# Patient Record
Sex: Male | Born: 1950 | Race: White | Hispanic: No | Marital: Single | Smoking: Never smoker
Health system: Southern US, Community
[De-identification: ages and names within clinical notes are randomized; demographics above are authoritative.]

## PROBLEM LIST (undated history)

## (undated) DIAGNOSIS — C801 Malignant (primary) neoplasm, unspecified: Secondary | ICD-10-CM

## (undated) DIAGNOSIS — G35 Multiple sclerosis: Secondary | ICD-10-CM

## (undated) HISTORY — PX: TONSILLECTOMY: SUR1361

## (undated) HISTORY — PX: FRACTURE SURGERY: SHX138

---

## 2002-03-05 ENCOUNTER — Encounter: Payer: Self-pay | Admitting: *Deleted

## 2002-03-05 ENCOUNTER — Emergency Department (HOSPITAL_COMMUNITY): Admission: EM | Admit: 2002-03-05 | Discharge: 2002-03-05 | Payer: Self-pay | Admitting: *Deleted

## 2020-05-11 ENCOUNTER — Emergency Department (HOSPITAL_COMMUNITY): Payer: Medicare HMO

## 2020-05-11 ENCOUNTER — Inpatient Hospital Stay (HOSPITAL_COMMUNITY)
Admission: EM | Admit: 2020-05-11 | Discharge: 2020-05-16 | DRG: 181 | Disposition: A | Discharge status: Home Health | Payer: Medicare HMO | Attending: Family Medicine | Admitting: Family Medicine

## 2020-05-11 ENCOUNTER — Encounter (HOSPITAL_COMMUNITY): Payer: Self-pay | Admitting: Emergency Medicine

## 2020-05-11 ENCOUNTER — Other Ambulatory Visit: Payer: Self-pay

## 2020-05-11 DIAGNOSIS — G35D Multiple sclerosis, unspecified: Secondary | ICD-10-CM | POA: Diagnosis present

## 2020-05-11 DIAGNOSIS — Z888 Allergy status to other drugs, medicaments and biological substances status: Secondary | ICD-10-CM | POA: Diagnosis not present

## 2020-05-11 DIAGNOSIS — Z6822 Body mass index (BMI) 22.0-22.9, adult: Secondary | ICD-10-CM | POA: Diagnosis not present

## 2020-05-11 DIAGNOSIS — L723 Sebaceous cyst: Secondary | ICD-10-CM | POA: Diagnosis present

## 2020-05-11 DIAGNOSIS — G35 Multiple sclerosis: Secondary | ICD-10-CM | POA: Diagnosis present

## 2020-05-11 DIAGNOSIS — E278 Other specified disorders of adrenal gland: Secondary | ICD-10-CM | POA: Diagnosis present

## 2020-05-11 DIAGNOSIS — R1084 Generalized abdominal pain: Secondary | ICD-10-CM | POA: Diagnosis not present

## 2020-05-11 DIAGNOSIS — H548 Legal blindness, as defined in USA: Secondary | ICD-10-CM | POA: Diagnosis present

## 2020-05-11 DIAGNOSIS — R918 Other nonspecific abnormal finding of lung field: Secondary | ICD-10-CM | POA: Diagnosis present

## 2020-05-11 DIAGNOSIS — E440 Moderate protein-calorie malnutrition: Secondary | ICD-10-CM | POA: Diagnosis present

## 2020-05-11 DIAGNOSIS — R630 Anorexia: Secondary | ICD-10-CM | POA: Diagnosis not present

## 2020-05-11 DIAGNOSIS — E279 Disorder of adrenal gland, unspecified: Secondary | ICD-10-CM | POA: Diagnosis present

## 2020-05-11 DIAGNOSIS — R634 Abnormal weight loss: Secondary | ICD-10-CM | POA: Diagnosis not present

## 2020-05-11 DIAGNOSIS — C7970 Secondary malignant neoplasm of unspecified adrenal gland: Secondary | ICD-10-CM | POA: Diagnosis present

## 2020-05-11 DIAGNOSIS — J439 Emphysema, unspecified: Secondary | ICD-10-CM | POA: Diagnosis present

## 2020-05-11 DIAGNOSIS — C3411 Malignant neoplasm of upper lobe, right bronchus or lung: Principal | ICD-10-CM | POA: Diagnosis present

## 2020-05-11 DIAGNOSIS — Z87891 Personal history of nicotine dependence: Secondary | ICD-10-CM | POA: Diagnosis not present

## 2020-05-11 DIAGNOSIS — R7989 Other specified abnormal findings of blood chemistry: Secondary | ICD-10-CM | POA: Diagnosis not present

## 2020-05-11 DIAGNOSIS — R739 Hyperglycemia, unspecified: Secondary | ICD-10-CM | POA: Diagnosis present

## 2020-05-11 DIAGNOSIS — E860 Dehydration: Secondary | ICD-10-CM | POA: Diagnosis present

## 2020-05-11 DIAGNOSIS — Z515 Encounter for palliative care: Secondary | ICD-10-CM

## 2020-05-11 DIAGNOSIS — G4700 Insomnia, unspecified: Secondary | ICD-10-CM | POA: Diagnosis present

## 2020-05-11 DIAGNOSIS — D630 Anemia in neoplastic disease: Secondary | ICD-10-CM | POA: Diagnosis present

## 2020-05-11 DIAGNOSIS — R110 Nausea: Secondary | ICD-10-CM | POA: Diagnosis present

## 2020-05-11 DIAGNOSIS — N2889 Other specified disorders of kidney and ureter: Secondary | ICD-10-CM | POA: Diagnosis not present

## 2020-05-11 DIAGNOSIS — C787 Secondary malignant neoplasm of liver and intrahepatic bile duct: Secondary | ICD-10-CM | POA: Diagnosis present

## 2020-05-11 DIAGNOSIS — R627 Adult failure to thrive: Principal | ICD-10-CM

## 2020-05-11 DIAGNOSIS — K5730 Diverticulosis of large intestine without perforation or abscess without bleeding: Secondary | ICD-10-CM | POA: Diagnosis present

## 2020-05-11 DIAGNOSIS — R112 Nausea with vomiting, unspecified: Secondary | ICD-10-CM | POA: Diagnosis present

## 2020-05-11 DIAGNOSIS — D72829 Elevated white blood cell count, unspecified: Secondary | ICD-10-CM | POA: Diagnosis present

## 2020-05-11 DIAGNOSIS — Z7189 Other specified counseling: Secondary | ICD-10-CM

## 2020-05-11 DIAGNOSIS — F411 Generalized anxiety disorder: Secondary | ICD-10-CM | POA: Diagnosis present

## 2020-05-11 DIAGNOSIS — Z800 Family history of malignant neoplasm of digestive organs: Secondary | ICD-10-CM | POA: Diagnosis not present

## 2020-05-11 DIAGNOSIS — R000 Tachycardia, unspecified: Secondary | ICD-10-CM | POA: Diagnosis present

## 2020-05-11 DIAGNOSIS — Z20822 Contact with and (suspected) exposure to covid-19: Secondary | ICD-10-CM | POA: Diagnosis present

## 2020-05-11 DIAGNOSIS — C3491 Malignant neoplasm of unspecified part of right bronchus or lung: Secondary | ICD-10-CM | POA: Diagnosis present

## 2020-05-11 DIAGNOSIS — M79604 Pain in right leg: Secondary | ICD-10-CM

## 2020-05-11 DIAGNOSIS — R109 Unspecified abdominal pain: Secondary | ICD-10-CM | POA: Diagnosis present

## 2020-05-11 DIAGNOSIS — R39198 Other difficulties with micturition: Secondary | ICD-10-CM | POA: Diagnosis present

## 2020-05-11 DIAGNOSIS — H543 Unqualified visual loss, both eyes: Secondary | ICD-10-CM | POA: Diagnosis not present

## 2020-05-11 DIAGNOSIS — R1900 Intra-abdominal and pelvic swelling, mass and lump, unspecified site: Secondary | ICD-10-CM

## 2020-05-11 DIAGNOSIS — J449 Chronic obstructive pulmonary disease, unspecified: Secondary | ICD-10-CM | POA: Diagnosis present

## 2020-05-11 DIAGNOSIS — E43 Unspecified severe protein-calorie malnutrition: Secondary | ICD-10-CM | POA: Diagnosis present

## 2020-05-11 HISTORY — DX: Multiple sclerosis: G35

## 2020-05-11 LAB — CBC WITH DIFFERENTIAL/PLATELET
Abs Immature Granulocytes: 0.03 10*3/uL (ref 0.00–0.07)
Basophils Absolute: 0.1 10*3/uL (ref 0.0–0.1)
Basophils Relative: 0 %
Eosinophils Absolute: 0 10*3/uL (ref 0.0–0.5)
Eosinophils Relative: 0 %
HCT: 40.8 % (ref 39.0–52.0)
Hemoglobin: 13 g/dL (ref 13.0–17.0)
Immature Granulocytes: 0 %
Lymphocytes Relative: 10 %
Lymphs Abs: 1.4 10*3/uL (ref 0.7–4.0)
MCH: 32.7 pg (ref 26.0–34.0)
MCHC: 31.9 g/dL (ref 30.0–36.0)
MCV: 102.8 fL — ABNORMAL HIGH (ref 80.0–100.0)
Monocytes Absolute: 1.1 10*3/uL — ABNORMAL HIGH (ref 0.1–1.0)
Monocytes Relative: 8 %
Neutro Abs: 10.8 10*3/uL — ABNORMAL HIGH (ref 1.7–7.7)
Neutrophils Relative %: 82 %
Platelets: 349 10*3/uL (ref 150–400)
RBC: 3.97 MIL/uL — ABNORMAL LOW (ref 4.22–5.81)
RDW: 12.4 % (ref 11.5–15.5)
WBC: 13.4 10*3/uL — ABNORMAL HIGH (ref 4.0–10.5)
nRBC: 0 % (ref 0.0–0.2)

## 2020-05-11 LAB — URINALYSIS, ROUTINE W REFLEX MICROSCOPIC
Bilirubin Urine: NEGATIVE
Glucose, UA: NEGATIVE mg/dL
Ketones, ur: 5 mg/dL — AB
Nitrite: NEGATIVE
Protein, ur: 30 mg/dL — AB
Specific Gravity, Urine: 1.035 — ABNORMAL HIGH (ref 1.005–1.030)
pH: 6 (ref 5.0–8.0)

## 2020-05-11 LAB — RESPIRATORY PANEL BY RT PCR (FLU A&B, COVID)
Influenza A by PCR: NEGATIVE
Influenza B by PCR: NEGATIVE
SARS Coronavirus 2 by RT PCR: NEGATIVE

## 2020-05-11 LAB — COMPREHENSIVE METABOLIC PANEL
ALT: 51 U/L — ABNORMAL HIGH (ref 0–44)
AST: 59 U/L — ABNORMAL HIGH (ref 15–41)
Albumin: 3.6 g/dL (ref 3.5–5.0)
Alkaline Phosphatase: 179 U/L — ABNORMAL HIGH (ref 38–126)
Anion gap: 12 (ref 5–15)
BUN: 18 mg/dL (ref 8–23)
CO2: 26 mmol/L (ref 22–32)
Calcium: 9.6 mg/dL (ref 8.9–10.3)
Chloride: 99 mmol/L (ref 98–111)
Creatinine, Ser: 0.66 mg/dL (ref 0.61–1.24)
GFR, Estimated: >60 mL/min (ref >60)
Glucose, Bld: 109 mg/dL — ABNORMAL HIGH (ref 70–99)
Potassium: 4.3 mmol/L (ref 3.5–5.1)
Sodium: 137 mmol/L (ref 135–145)
Total Bilirubin: 0.5 mg/dL (ref 0.3–1.2)
Total Protein: 7.3 g/dL (ref 6.5–8.1)

## 2020-05-11 LAB — LIPASE, BLOOD: Lipase: 21 U/L (ref 11–51)

## 2020-05-11 MED ORDER — ONDANSETRON HCL 4 MG/2ML IJ SOLN
4.0000 mg | Freq: Four times a day (QID) | INTRAMUSCULAR | Status: DC | PRN
  Administered 2020-05-13: 4 mg via INTRAVENOUS
  Filled 2020-05-11: qty 2

## 2020-05-11 MED ORDER — ONDANSETRON HCL 4 MG/2ML IJ SOLN
4.0000 mg | Freq: Once | INTRAMUSCULAR | Status: AC
  Administered 2020-05-11: 4 mg via INTRAVENOUS
  Filled 2020-05-11: qty 2

## 2020-05-11 MED ORDER — TRAZODONE HCL 50 MG PO TABS: 25.0000 mg | ORAL_TABLET | Freq: Every evening | ORAL | Status: DC | PRN

## 2020-05-11 MED ORDER — BISACODYL 5 MG PO TBEC: 5.0000 mg | DELAYED_RELEASE_TABLET | Freq: Every day | ORAL | Status: DC | PRN

## 2020-05-11 MED ORDER — MORPHINE SULFATE (PF) 4 MG/ML IV SOLN
4.0000 mg | Freq: Once | INTRAVENOUS | Status: AC
  Administered 2020-05-11: 4 mg via INTRAVENOUS
  Filled 2020-05-11: qty 1

## 2020-05-11 MED ORDER — ACETAMINOPHEN 325 MG PO TABS
650.0000 mg | ORAL_TABLET | Freq: Four times a day (QID) | ORAL | Status: DC | PRN
  Administered 2020-05-13: 650 mg via ORAL
  Filled 2020-05-11: qty 2

## 2020-05-11 MED ORDER — PANTOPRAZOLE SODIUM 40 MG PO TBEC
40.0000 mg | DELAYED_RELEASE_TABLET | Freq: Every day | ORAL | Status: DC
  Administered 2020-05-12 – 2020-05-16 (×4): 40 mg via ORAL
  Filled 2020-05-11 (×5): qty 1

## 2020-05-11 MED ORDER — ACETAMINOPHEN 650 MG RE SUPP: 650.0000 mg | Freq: Four times a day (QID) | RECTAL | Status: DC | PRN

## 2020-05-11 MED ORDER — IPRATROPIUM-ALBUTEROL 0.5-2.5 (3) MG/3ML IN SOLN: 3.0000 mL | RESPIRATORY_TRACT | Status: DC | PRN

## 2020-05-11 MED ORDER — SODIUM CHLORIDE 0.9 % IV BOLUS
1000.0000 mL | Freq: Once | INTRAVENOUS | Status: AC
  Administered 2020-05-11: 1000 mL via INTRAVENOUS

## 2020-05-11 MED ORDER — SODIUM CHLORIDE 0.9 % IV SOLN: INTRAVENOUS | Status: DC

## 2020-05-11 MED ORDER — ONDANSETRON HCL 4 MG PO TABS: 4.0000 mg | ORAL_TABLET | Freq: Four times a day (QID) | ORAL | Status: DC | PRN

## 2020-05-11 MED ORDER — IOHEXOL 300 MG/ML  SOLN
80.0000 mL | Freq: Once | INTRAMUSCULAR | Status: AC | PRN
  Administered 2020-05-11: 80 mL via INTRAVENOUS

## 2020-05-11 MED ORDER — OXYCODONE HCL 5 MG PO TABS
5.0000 mg | ORAL_TABLET | Freq: Four times a day (QID) | ORAL | Status: DC | PRN
  Administered 2020-05-12 – 2020-05-14 (×4): 5 mg via ORAL
  Filled 2020-05-11 (×4): qty 1

## 2020-05-11 MED ORDER — HEPARIN SODIUM (PORCINE) 5000 UNIT/ML IJ SOLN
5000.0000 [IU] | Freq: Three times a day (TID) | INTRAMUSCULAR | Status: DC
  Administered 2020-05-11 – 2020-05-14 (×8): 5000 [IU] via SUBCUTANEOUS
  Filled 2020-05-11 (×8): qty 1

## 2020-05-11 MED ORDER — HYDROMORPHONE HCL 1 MG/ML IJ SOLN
0.2500 mg | INTRAMUSCULAR | Status: DC | PRN
  Administered 2020-05-11 – 2020-05-13 (×9): 0.25 mg via INTRAVENOUS
  Filled 2020-05-11 (×9): qty 0.5

## 2020-05-11 MED ORDER — LORAZEPAM 0.5 MG PO TABS: 0.5000 mg | ORAL_TABLET | ORAL | Status: DC | PRN

## 2020-05-11 NOTE — H&P (Signed)
History and Physical  [facility]  Brandon Castillo OTL:[id number] DOB: December 22, 1950 DOA: 05/11/2020  PCP: Patient, No Pcp Per  Patient coming from: Home   I have personally briefly reviewed patient's old medical records in [place]  Chief Complaint: weakness   HPI: Brandon Castillo is a 69 y.o. 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  He has not seen a regular physician since he was a child.  He says that he has been hospitalized on rare occasions many years ago but not seeing any regular providers.  He reports that he is a former heavy smoker.  He smoked 1 pack/day for about 30 years.  He says that he quit smoking several years ago.  Patient says that he has never had a colon cancer screening and no colonoscopy or EGD.  He says that he lives with his brother who serves as his primary caretaker.  He says that for the past month he has been experiencing decreasing appetite, weight loss, generalized malaise, generalized abdominal pain, fatigue and pain in the left groin and lower abdomen and difficulty urinating.  He also reports that he has been dealing with a growth on his scrotum that affects him when he tries to sit down.  He has been having occasional fever and chills.  He is not sure if he has had blood in the urine because he is blind.  He cannot tell me the color of his stool.  He presented to the emergency department with his brother today because they have been concerned about his abnormal weight loss, abdominal pain generalized weakness and frailty.  ED Course: The patient was afebrile on arrival with a temperature of 98.5, pulse 112, blood pressure 141/75, pulse ox 99% on room air.  Weight 68 kg.  BMI 22.14.  Glucose 109.  The sodium was 137, potassium 4.3, BUN 18, creatinine 0.66, alk phos 179, lipase 21, AST 59, ALT 51, WBC 13.4, hemoglobin 13.0, hematocrit 40.8, MCV 102, platelet count 349.  Influenza a and B and SARS 2  coronavirus test were all negative.  Urinalysis with findings of small amount of hemoglobin, ketones, trace leukocytes.  CT abdomen was done with findings of bilateral renal masses, liver nodule, bilateral adrenal nodules and a right upper lobe density.  CT chest without contrast with findings of a right upper lobe mass which is suspicious for primary lung neoplasm with metastases seen and noted.  Spoke with oncologist Dr. Delton Coombes who will consult on patient.  He says patient [facility].  He recommended IR evaluation for possible liver biopsy for tissue diagnosis.  Review of Systems: As per HPI otherwise 10 point review of systems negative.   Past Medical History:  Diagnosis Date  . Multiple sclerosis ([place])     History reviewed. No pertinent surgical history.   reports that he has never smoked. He has never used smokeless tobacco. He reports previous alcohol use. He reports previous drug use.  Allergies  Allergen Reactions  . Demerol [Meperidine Hcl]     History reviewed. No pertinent family history.   Prior to Admission medications   Medication Sig Start Date End Date Taking? Authorizing Provider  HYDROcodone-acetaminophen (NORCO) 10-325 MG tablet Take 2 tablets by mouth every 6 (six) hours as needed for moderate pain.   Yes [provider]  LORazepam (ATIVAN) 1 MG tablet Take 1 mg by mouth every 8 (eight) hours as needed for anxiety.  Yes [provider]    Physical Exam: Vitals:   05/11/20 1030 05/11/20 1034 05/11/20 1100  BP:  (!) 161/80 (!) 141/75  Pulse:  (!) 112   Resp:  17   Temp:  98.5 F (36.9 C)   TempSrc:  Oral   SpO2:  99%   Weight: 68 kg    Height: _0  (1.753 m)     Constitutional: Thin emaciated cachectic appearing male, blind with dark glasses, NAD, calm, comfortable Eyes: PERRL, lids and conjunctivae normal ENMT: Mucous membranes are dry and pale. Posterior pharynx clear of any exudate or lesions.  Neck: normal,  supple, no masses, no thyromegaly Respiratory: clear to auscultation bilaterally, no wheezing, no crackles. Normal respiratory effort. No accessory muscle use.  Cardiovascular: Normal S1-S2 sounds, tachycardic rate, no murmurs / rubs / gallops. No extremity edema. 2+ pedal pulses. No carotid bruits.  Abdomen: Suprapubic tenderness and left lower quadrant tenderness with deep palpation no masses palpated. No hepatosplenomegaly. Bowel sounds positive.  GU: Normal circumcised penis, sebaceous cyst approximately 2 cm in diameter noted on the scrotum does not appear to be infected at this time. Musculoskeletal: no clubbing / cyanosis. No joint deformity upper and lower extremities. Good ROM, no contractures. Normal muscle tone.  Skin: no rashes, lesions, ulcers. No induration Neurologic: Blind in both eyes.  No focal findings. Sensation intact, DTR normal. Strength 5/5 in all 4.  Psychiatric: Normal judgment and insight. Alert and oriented x 3. Normal mood.    Labs on Admission: I have personally reviewed following labs and imaging studies  CBC: Recent Labs  Lab 05/11/20 1047  WBC 13.4*  NEUTROABS 10.8*  HGB 13.0  HCT 40.8  MCV 102.8*  PLT 161   Basic Metabolic Panel: Recent Labs  Lab 05/11/20 1047  NA 137  K 4.3  CL 99  CO2 26  GLUCOSE 109*  BUN 18  CREATININE 0.66  CALCIUM 9.6   GFR: Estimated Creatinine Clearance: 85 mL/min (by C-G formula based on SCr of 0.66 mg/dL). Liver Function Tests: Recent Labs  Lab 05/11/20 1047  AST 59*  ALT 51*  ALKPHOS 179*  BILITOT 0.5  PROT 7.3  ALBUMIN 3.6   Recent Labs  Lab 05/11/20 1047  LIPASE 21   No results for input(s): AMMONIA in the last 168 hours. Coagulation Profile: No results for input(s): INR, PROTIME in the last 168 hours. Cardiac Enzymes: No results for input(s): CKTOTAL, CKMB, CKMBINDEX, TROPONINI in the last 168 hours. BNP (last 3 results) No results for input(s): PROBNP in the last 8760 hours. HbA1C: No  results for input(s): HGBA1C in the last 72 hours. CBG: No results for input(s): GLUCAP in the last 168 hours. Lipid Profile: No results for input(s): CHOL, HDL, LDLCALC, TRIG, CHOLHDL, LDLDIRECT in the last 72 hours. Thyroid Function Tests: No results for input(s): TSH, T4TOTAL, FREET4, T3FREE, THYROIDAB in the last 72 hours. Anemia Panel: No results for input(s): VITAMINB12, FOLATE, FERRITIN, TIBC, IRON, RETICCTPCT in the last 72 hours. Urine analysis:    Component Value Date/Time   COLORURINE YELLOW 05/11/2020 [place] 05/11/2020 1041   LABSPEC 1.035 (H) 05/11/2020 1041   PHURINE 6.0 05/11/2020 [place] 05/11/2020 1041   HGBUR SMALL (A) 05/11/2020 1041   BILIRUBINUR NEGATIVE 05/11/2020 1041   KETONESUR 5 (A) 05/11/2020 1041   PROTEINUR 30 (A) 05/11/2020 1041   NITRITE NEGATIVE 05/11/2020 1041   LEUKOCYTESUR TRACE (A) 05/11/2020 1041    Radiological Exams on Admission: DG  Chest 2 View  Result Date: 05/11/2020 CLINICAL DATA:  Chest pain. EXAM: CHEST - 2 VIEW COMPARISON:  None. FINDINGS: The heart is normal in size. Mild tortuosity and calcification of the thoracic aorta. Slight ill-defined density in the right suprahilar/right upper lobe region. This is indeterminate finding but chest CT suggested to further evaluate. Lungs demonstrate hyperinflation and emphysematous changes. No pleural effusions. IMPRESSION: 1. Ill-defined right suprahilar/right upper lobe density. Chest CT suggested for further evaluate. 2. Underlying emphysematous changes. Electronically Signed   By: Marijo Sanes M.D.   On: 05/11/2020 13:26   CT Chest Wo Contrast  Result Date: 05/11/2020 CLINICAL DATA:  Abnormal chest x-ray. EXAM: CT CHEST WITHOUT CONTRAST TECHNIQUE: Multidetector CT imaging of the chest was performed following the standard protocol without IV contrast. COMPARISON:  Chest x-ray, same date. FINDINGS: Cardiovascular: The heart is normal in size. No pericardial  effusion. Mild tortuosity and ectasia of the thoracic aorta with scattered atherosclerotic calcifications. Scattered coronary artery calcifications. Mediastinum/Nodes: Right upper lobe lung mass is invading the right hilum and right mediastinum. I do not see any discrete measurable mediastinal lymph nodes. The esophagus is grossly normal. Lungs/Pleura: 3.2 cm spiculated right upper lobe lung mass medially invading the right hilum and the right mediastinum. Findings consistent with primary lung neoplasm. Advanced emphysematous changes and areas of pulmonary scarring. There are several scattered subpleural pulmonary nodules noted which are indeterminate. None of these measures more than 5 mm. Metastatic disease is certainly a consideration. No acute pulmonary findings.  No pleural effusions. Upper Abdomen: Bilateral adrenal gland nodules and upper pole right renal lesion as seen on today's abdominal/pelvic CT scan. Musculoskeletal: No worrisome bone lesions to suggest metastatic bone disease. IMPRESSION: 1. 3.2 cm spiculated right upper lobe lung mass invading the right hilum and right mediastinum consistent with primary lung neoplasm. 2. Several scattered subpleural pulmonary nodules are indeterminate. Metastatic disease is certainly a consideration. 3. Advanced emphysematous changes and pulmonary scarring. 4. Bilateral adrenal gland nodules and upper pole right renal lesion as seen on today's abdominal/pelvic CT scan. 5. No findings for osseous metastatic disease. 6. Emphysema and aortic atherosclerosis. Aortic Atherosclerosis (ICD10-I70.0) and Emphysema (ICD10-J43.9). Electronically Signed   By: Marijo Sanes M.D.   On: 05/11/2020 14:26   CT Abdomen Pelvis W Contrast  Addendum Date: 05/11/2020   ADDENDUM REPORT: 05/11/2020 13:43 ADDENDUM: Of note, an alternative differential consideration for the bilateral renal masses is renal abscesses. The hypoechoic mass in the liver could represent a benign hemangioma,  and MR abdomen according could be considered for further evaluation as part of the patient's workup. These results were called by telephone at the time of interpretation on 05/11/2020 at 1:42 pm to provider [facility] , who verbally acknowledged these results. Electronically Signed   By: Zerita Boers M.D.   On: 05/11/2020 13:43   Result Date: 05/11/2020 CLINICAL DATA:  Lower abdominal pain and generalized weakness for 2 weeks. EXAM: CT ABDOMEN AND PELVIS WITH CONTRAST TECHNIQUE: Multidetector CT imaging of the abdomen and pelvis was performed using the standard protocol following bolus administration of intravenous contrast. CONTRAST:  84m OMNIPAQUE IOHEXOL 300 MG/ML  SOLN COMPARISON:  Report from abdominal radiograph dated 03/05/2002. FINDINGS: Lower chest: No acute abnormality. Hepatobiliary: There is a 1.6 cm hypoattenuating mass in hepatic segment 5/4B near the gallbladder fossa. No gallstones, gallbladder wall thickening, or biliary dilatation. Pancreas: Unremarkable. No pancreatic ductal dilatation or surrounding inflammatory changes. Spleen: Normal in size without focal abnormality. Adrenals/Urinary Tract: Two left adrenal nodules measure  approximately 0.8 and 0.9 cm (series 5, image 49 and image 46). The right adrenal gland appears normal. Multiple indistinct hypoechoic masses enlarged the right kidney with the largest measuring 6.2 cm in diameter. A mass is seen in the right renal pelvis, measuring 2.9 cm (series 7, image 16). At least two hypoechoic masses are seen in the left kidney, measuring up to 1.7 cm. A 7 mm mass in the lateral right kidney likely represents a benign cyst (series 2, image 33). There is no hydronephrosis. Bladder is unremarkable. Stomach/Bowel: Stomach is within normal limits. Appendix appears normal. There is colonic diverticulosis without evidence of diverticulitis. No evidence of bowel wall thickening, distention, or inflammatory changes. Vascular/Lymphatic: Aortic  atherosclerosis. The inferior vena cava and both renal veins appear normal. No enlarged abdominal or pelvic lymph nodes. Reproductive: The prostate is mildly enlarged, measuring 5.1 cm in transverse dimension Other: No abdominal wall hernia or abnormality. No abdominopelvic ascites. Musculoskeletal: Degenerative changes are seen in the spine. IMPRESSION: 1. Multiple hypoechoic masses in both kidneys as well as a mass in the right renal pelvis. Differential considerations include renal cell carcinoma, transitional cell carcinoma, renal lymphoma, and metastatic disease. Indeterminate hypoattenuating mass in the liver and two left adrenal nodules may represent metastatic disease. CT abdomen pelvis according to a renal protocol could be considered for further evaluation. Aortic Atherosclerosis (ICD10-I70.0). Electronically Signed: By: Zerita Boers M.D. On: 05/11/2020 12:28   Assessment/Plan Principal Problem:   Mass of upper lobe of right lung Active Problems:   Generalized abdominal pain   Abnormal weight loss   Multiple sclerosis (HCC)   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   Nausea and vomiting   Anorexia   Difficulty urinating   Leukocytosis   Sinus tachycardia   Scrotal sebaceous cyst   Bilateral renal masses   Bilateral Adrenal nodules   Anxiety, generalized   Elevated LFTs   Emphysema lung (HCC)   COPD (chronic obstructive pulmonary disease) ([place])   Former smoker   Hyperglycemia   1. 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  IR evaluation requested for possible tissue biopsy of liver lesion.   2. Abnormal weight loss - secondary to metastatic cancer - consult to dietitian for recommendations.  3. Elevated LFTs - secondary to liver metastases - Follow.  4. COPD/Emphysema - former heavy smoker - bronchodilators ordered as needed.  5. Difficulty urinating - monitor urine output, cath as needed.  6. Leukocytosis - likely reactive, will  follow closely, monitor for signs of infection.  7. Generalized anxiety - lorazepam ordered as needed.  8. Generalized abdominal pain and cancer pain - Oral and IV pain medications ordered as needed.  9. Sinus tachycardia - secondary to metastatic cancer - treating supportively.  Check venous dopplers of legs.  10. Scrotal sebaceous cyst - refer to general surgery when he has been stabilized for removal of the cyst.  11. Nausea and vomiting -IV nausea medication ordered as needed.  12. Multiple Sclerosis - Pt reports that he is not followed for this and has not seen a provider in many years.  78. Blindness - Brother plans to stay with patient as much as possible to assist patient with ADLs.    DVT prophylaxis: SQ heparin   Code Status: Full   Family Communication: brother at bedside   Disposition Plan: TBD   Consults called: oncology Delton Coombes)  Admission status: INP    Irwin Brakeman MD [facility] How to contact  the [facility] Attending or Consulting provider [place] or covering provider during after hours [place], for this patient?  1. Check the care team in [facility] and look for a) attending/consulting [facility] provider listed and b) the [facility] team listed 2. Log into [email or web address] and use [place]'s universal password to access. If you do not have the password, please contact the hospital operator. 3. Locate the [facility] provider you are looking for under [facility] and page to a number that you can be directly reached. 4. If you still have difficulty reaching the provider, please page the [facility] (Director on Call) for the Hospitalists listed on amion for assistance.  If 7PM-7AM, please contact night-coverage [email or web address] Password [email or web address]  05/11/2020, 3:04 PM

## 2020-05-11 NOTE — ED Provider Notes (Addendum)
[facility] EMERGENCY DEPARTMENT Provider Note   CSN: [id number] Arrival date & time: 05/11/20  1000     History Chief Complaint  Patient presents with   Abdominal Pain    Brandon Castillo is a 69 y.o. male.  Pt presents to the ED today with abdominal pain.  Pt said he's had n/v/d as well.  He's had some intermittent fevers.  Pt has not been eating and drinking well.  He feels very weak.  He has lost a lot of weight.        Past Medical History:  Diagnosis Date   Multiple sclerosis 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 No pertinent family history.  Social History   Tobacco Use   Smoking status: Never Smoker   Smokeless tobacco: Never Used  Substance Use Topics   Alcohol use: Not Currently   Drug use: Not Currently    Home Medications Prior to Admission medications   Medication Sig Start Date End Date Taking? Authorizing Provider  HYDROcodone-acetaminophen (NORCO) 10-325 MG tablet Take 2 tablets by mouth every 6 (six) hours as needed for moderate pain.   Yes [provider]  LORazepam (ATIVAN) 1 MG tablet Take 1 mg by mouth every 8 (eight) hours as needed for anxiety.   Yes [provider]   old lortab and lorazepam (nothing recently prescribed)  Allergies    Demerol [meperidine hcl]  Review of Systems   Review of Systems  Constitutional: Positive for appetite  change.  Gastrointestinal: Positive for abdominal pain, diarrhea, nausea and vomiting.  Neurological: Positive for weakness.  All other systems reviewed and are negative.   Physical Exam Updated Vital Signs BP (!) 141/75    Pulse (!) 112    Temp 98.5 F (36.9 C) (Oral)    Resp 17    Ht 5\' 9"  (1.753 m)    Wt 68 kg    SpO2 99%    BMI 22.15 kg/m   Physical Exam Vitals and nursing note reviewed. Exam conducted with a chaperone present.  Constitutional:      Appearance: He is underweight. He is ill-appearing.  HENT:     Head: Normocephalic and atraumatic.     Mouth/Throat:     Mouth: Mucous membranes are dry.  Eyes:     Comments: Blind from ms  Cardiovascular:     Rate and Rhythm: Regular rhythm. Tachycardia present.     Heart sounds: Normal heart sounds.  Pulmonary:     Effort: Pulmonary effort is normal.     Breath sounds: Normal breath sounds.  Abdominal:     General: Abdomen is flat.     Palpations: Abdomen is soft.     Tenderness: There is generalized abdominal tenderness.  Genitourinary:    Penis:  Normal.      Testes: Normal.  Skin:    General: Skin is warm.     Capillary Refill: Capillary refill takes less than 2 seconds.  Neurological:     General: No focal deficit present.     Mental Status: He is alert and oriented to person, place, and time.  Psychiatric:        Mood and Affect: Mood normal.        Behavior: Behavior normal.     ED Results / Procedures / Treatments   Labs (all labs ordered are listed, but only abnormal results are displayed) Labs Reviewed  CBC WITH DIFFERENTIAL/PLATELET - Abnormal; Notable for the following components:      Result Value   WBC 13.4 (*)    RBC 3.97 (*)    MCV 102.8 (*)    Neutro Abs 10.8 (*)    Monocytes Absolute 1.1 (*)    All other components within normal limits  COMPREHENSIVE METABOLIC PANEL - Abnormal; Notable for the following components:   Glucose, Bld 109 (*)    AST 59 (*)    ALT 51 (*)    Alkaline  Phosphatase 179 (*)    All other components within normal limits  URINALYSIS, ROUTINE W REFLEX MICROSCOPIC - Abnormal; Notable for the following components:   Specific Gravity, Urine 1.035 (*)    Hgb urine dipstick SMALL (*)    Ketones, ur 5 (*)    Protein, ur 30 (*)    Leukocytes,Ua TRACE (*)    Bacteria, UA FEW (*)    All other components within normal limits  RESPIRATORY PANEL BY RT PCR (FLU A&B, COVID)  LIPASE, BLOOD    EKG None  Radiology DG Chest 2 View  Result Date: 05/11/2020 CLINICAL DATA:  Chest pain. EXAM: CHEST - 2 VIEW COMPARISON:  None. FINDINGS: The heart is normal in size. Mild tortuosity and calcification of the thoracic aorta. Slight ill-defined density in the right suprahilar/right upper lobe region. This is indeterminate finding but chest CT suggested to further evaluate. Lungs demonstrate hyperinflation and emphysematous changes. No pleural effusions. IMPRESSION: 1. Ill-defined right suprahilar/right upper lobe density. Chest CT suggested for further evaluate. 2. Underlying emphysematous changes. Electronically Signed   By: Marijo Sanes M.D.   On: 05/11/2020 13:26   CT Chest Wo Contrast  Result Date: 05/11/2020 CLINICAL DATA:  Abnormal chest x-ray. EXAM: CT CHEST WITHOUT CONTRAST TECHNIQUE: Multidetector CT imaging of the chest was performed following the standard protocol without IV contrast. COMPARISON:  Chest x-ray, same date. FINDINGS: Cardiovascular: The heart is normal in size. No pericardial effusion. Mild tortuosity and ectasia of the thoracic aorta with scattered atherosclerotic calcifications. Scattered coronary artery calcifications. Mediastinum/Nodes: Right upper lobe lung mass is invading the right hilum and right mediastinum. I do not see any discrete measurable mediastinal lymph nodes. The esophagus is grossly normal. Lungs/Pleura: 3.2 cm spiculated right upper lobe lung mass medially invading the right hilum and the right mediastinum. Findings  consistent with primary lung neoplasm. Advanced emphysematous changes and areas of pulmonary scarring. There are several scattered subpleural pulmonary nodules noted which are indeterminate. None of these measures more than 5 mm. Metastatic disease is certainly a consideration. No acute pulmonary findings.  No pleural effusions. Upper Abdomen: Bilateral adrenal gland nodules and upper pole right renal lesion as seen on today's abdominal/pelvic CT scan. Musculoskeletal: No worrisome bone lesions to suggest metastatic bone disease. IMPRESSION: 1. 3.2 cm spiculated right upper lobe lung mass invading the right hilum  and right mediastinum consistent with primary lung neoplasm. 2. Several scattered subpleural pulmonary nodules are indeterminate. Metastatic disease is certainly a consideration. 3. Advanced emphysematous changes and pulmonary scarring. 4. Bilateral adrenal gland nodules and upper pole right renal lesion as seen on today's abdominal/pelvic CT scan. 5. No findings for osseous metastatic disease. 6. Emphysema and aortic atherosclerosis. Aortic Atherosclerosis (ICD10-I70.0) and Emphysema (ICD10-J43.9). Electronically Signed   By: Marijo Sanes M.D.   On: 05/11/2020 14:26   CT Abdomen Pelvis W Contrast  Addendum Date: 05/11/2020   ADDENDUM REPORT: 05/11/2020 13:43 ADDENDUM: Of note, an alternative differential consideration for the bilateral renal masses is renal abscesses. The hypoechoic mass in the liver could represent a benign hemangioma, and MR abdomen according could be considered for further evaluation as part of the patient's workup. These results were called by telephone at the time of interpretation on 05/11/2020 at 1:42 pm to provider [facility] , who verbally acknowledged these results. Electronically Signed   By: Zerita Boers M.D.   On: 05/11/2020 13:43   Result Date: 05/11/2020 CLINICAL DATA:  Lower abdominal pain and generalized weakness for 2 weeks. EXAM: CT ABDOMEN AND PELVIS WITH  CONTRAST TECHNIQUE: Multidetector CT imaging of the abdomen and pelvis was performed using the standard protocol following bolus administration of intravenous contrast. CONTRAST:  54mL OMNIPAQUE IOHEXOL 300 MG/ML  SOLN COMPARISON:  Report from abdominal radiograph dated 03/05/2002. FINDINGS: Lower chest: No acute abnormality. Hepatobiliary: There is a 1.6 cm hypoattenuating mass in hepatic segment 5/4B near the gallbladder fossa. No gallstones, gallbladder wall thickening, or biliary dilatation. Pancreas: Unremarkable. No pancreatic ductal dilatation or surrounding inflammatory changes. Spleen: Normal in size without focal abnormality. Adrenals/Urinary Tract: Two left adrenal nodules measure approximately 0.8 and 0.9 cm (series 5, image 49 and image 46). The right adrenal gland appears normal. Multiple indistinct hypoechoic masses enlarged the right kidney with the largest measuring 6.2 cm in diameter. A mass is seen in the right renal pelvis, measuring 2.9 cm (series 7, image 16). At least two hypoechoic masses are seen in the left kidney, measuring up to 1.7 cm. A 7 mm mass in the lateral right kidney likely represents a benign cyst (series 2, image 33). There is no hydronephrosis. Bladder is unremarkable. Stomach/Bowel: Stomach is within normal limits. Appendix appears normal. There is colonic diverticulosis without evidence of diverticulitis. No evidence of bowel wall thickening, distention, or inflammatory changes. Vascular/Lymphatic: Aortic atherosclerosis. The inferior vena cava and both renal veins appear normal. No enlarged abdominal or pelvic lymph nodes. Reproductive: The prostate is mildly enlarged, measuring 5.1 cm in transverse dimension Other: No abdominal wall hernia or abnormality. No abdominopelvic ascites. Musculoskeletal: Degenerative changes are seen in the spine. IMPRESSION: 1. Multiple hypoechoic masses in both kidneys as well as a mass in the right renal pelvis. Differential considerations  include renal cell carcinoma, transitional cell carcinoma, renal lymphoma, and metastatic disease. Indeterminate hypoattenuating mass in the liver and two left adrenal nodules may represent metastatic disease. CT abdomen pelvis according to a renal protocol could be considered for further evaluation. Aortic Atherosclerosis (ICD10-I70.0). Electronically Signed: By: Zerita Boers M.D. On: 05/11/2020 12:28    Procedures Procedures (including critical care time)  Medications Ordered in ED Medications  morphine 4 MG/ML injection 4 mg (4 mg Intravenous Given 05/11/20 1120)  ondansetron (ZOFRAN) injection 4 mg (4 mg Intravenous Given 05/11/20 1120)  sodium chloride 0.9 % bolus 1,000 mL (0 mLs Intravenous Stopped 05/11/20 1210)  iohexol (OMNIPAQUE) 300 MG/ML solution 80 mL (  80 mLs Intravenous Contrast Given 05/11/20 1154)    ED Course  I have reviewed the triage vital signs and the nursing notes.  Pertinent labs & imaging results that were available during my care of the patient were reviewed by me and considered in my medical decision making (see chart for details).    MDM Rules/Calculators/A&P                          Pt has not seen a doctor in several years.  He has no pcp or follow up.  He has a belly full of probable cancer and has not been eating or drinking at home.  CXR showed a possible mass.  CT chest ordered.  Chest CT shows a RUL mass that is likely a primary lung cancer.  Masses in abdomen are likely mets.   Pt d/w Dr. Wynetta Emery ([facility]) who will admit.  Final Clinical Impression(s) / ED Diagnoses Final diagnoses:  Failure to thrive in adult  Intraabdominal mass  Dehydration    Rx / DC Orders ED Discharge Orders    None       Isla Pence, MD 05/11/20 1353    Isla Pence, MD 05/11/20 1433

## 2020-05-11 NOTE — ED Triage Notes (Signed)
Pt reports possible spider bite to scrotum several weeks ago. Pt reports "knot" on testicle went away and reports came back a few days ago and reports intense pain in left goin/lower abdomen. Pt also reports rectal pain/pressure and generalized weakness.

## 2020-05-12 ENCOUNTER — Inpatient Hospital Stay (HOSPITAL_COMMUNITY): Payer: Medicare HMO

## 2020-05-12 DIAGNOSIS — R918 Other nonspecific abnormal finding of lung field: Secondary | ICD-10-CM

## 2020-05-12 DIAGNOSIS — R634 Abnormal weight loss: Secondary | ICD-10-CM

## 2020-05-12 DIAGNOSIS — E43 Unspecified severe protein-calorie malnutrition: Secondary | ICD-10-CM | POA: Diagnosis present

## 2020-05-12 DIAGNOSIS — R1900 Intra-abdominal and pelvic swelling, mass and lump, unspecified site: Secondary | ICD-10-CM | POA: Diagnosis not present

## 2020-05-12 DIAGNOSIS — E278 Other specified disorders of adrenal gland: Secondary | ICD-10-CM | POA: Diagnosis not present

## 2020-05-12 DIAGNOSIS — R627 Adult failure to thrive: Secondary | ICD-10-CM | POA: Diagnosis not present

## 2020-05-12 DIAGNOSIS — E440 Moderate protein-calorie malnutrition: Secondary | ICD-10-CM | POA: Insufficient documentation

## 2020-05-12 LAB — CBC WITH DIFFERENTIAL/PLATELET
Abs Immature Granulocytes: 0.04 10*3/uL (ref 0.00–0.07)
Basophils Absolute: 0 10*3/uL (ref 0.0–0.1)
Basophils Relative: 0 %
Eosinophils Absolute: 0 10*3/uL (ref 0.0–0.5)
Eosinophils Relative: 0 %
HCT: 37.6 % — ABNORMAL LOW (ref 39.0–52.0)
Hemoglobin: 11.8 g/dL — ABNORMAL LOW (ref 13.0–17.0)
Immature Granulocytes: 0 %
Lymphocytes Relative: 16 %
Lymphs Abs: 1.6 10*3/uL (ref 0.7–4.0)
MCH: 32.1 pg (ref 26.0–34.0)
MCHC: 31.4 g/dL (ref 30.0–36.0)
MCV: 102.2 fL — ABNORMAL HIGH (ref 80.0–100.0)
Monocytes Absolute: 0.9 10*3/uL (ref 0.1–1.0)
Monocytes Relative: 9 %
Neutro Abs: 7.5 10*3/uL (ref 1.7–7.7)
Neutrophils Relative %: 75 %
Platelets: 344 10*3/uL (ref 150–400)
RBC: 3.68 MIL/uL — ABNORMAL LOW (ref 4.22–5.81)
RDW: 12.2 % (ref 11.5–15.5)
WBC: 10.2 10*3/uL (ref 4.0–10.5)
nRBC: 0 % (ref 0.0–0.2)

## 2020-05-12 LAB — COMPREHENSIVE METABOLIC PANEL
ALT: 55 U/L — ABNORMAL HIGH (ref 0–44)
AST: 51 U/L — ABNORMAL HIGH (ref 15–41)
Albumin: 3.1 g/dL — ABNORMAL LOW (ref 3.5–5.0)
Alkaline Phosphatase: 153 U/L — ABNORMAL HIGH (ref 38–126)
Anion gap: 12 (ref 5–15)
BUN: 13 mg/dL (ref 8–23)
CO2: 23 mmol/L (ref 22–32)
Calcium: 9.3 mg/dL (ref 8.9–10.3)
Chloride: 103 mmol/L (ref 98–111)
Creatinine, Ser: 0.53 mg/dL — ABNORMAL LOW (ref 0.61–1.24)
GFR, Estimated: >60 mL/min (ref >60)
Glucose, Bld: 93 mg/dL (ref 70–99)
Potassium: 4 mmol/L (ref 3.5–5.1)
Sodium: 138 mmol/L (ref 135–145)
Total Bilirubin: 0.9 mg/dL (ref 0.3–1.2)
Total Protein: 6.5 g/dL (ref 6.5–8.1)

## 2020-05-12 LAB — HIV ANTIBODY (ROUTINE TESTING W REFLEX): HIV Screen 4th Generation wRfx: NONREACTIVE

## 2020-05-12 LAB — MAGNESIUM: Magnesium: 2 mg/dL (ref 1.7–2.4)

## 2020-05-12 MED ORDER — GADOBUTROL 1 MMOL/ML IV SOLN
5.0000 mL | Freq: Once | INTRAVENOUS | Status: AC | PRN
  Administered 2020-05-12: 5 mL via INTRAVENOUS

## 2020-05-12 MED ORDER — ENSURE ENLIVE PO LIQD
237.0000 mL | Freq: Three times a day (TID) | ORAL | Status: DC
  Administered 2020-05-12 – 2020-05-15 (×5): 237 mL via ORAL

## 2020-05-12 MED ORDER — OCUVITE-LUTEIN PO CAPS
1.0000 | ORAL_CAPSULE | Freq: Every day | ORAL | Status: DC
  Administered 2020-05-12 – 2020-05-16 (×4): 1 via ORAL
  Filled 2020-05-12 (×5): qty 1

## 2020-05-12 MED ORDER — POLYETHYLENE GLYCOL 3350 17 G PO PACK
17.0000 g | PACK | Freq: Every day | ORAL | Status: DC
  Administered 2020-05-12 – 2020-05-16 (×3): 17 g via ORAL
  Filled 2020-05-12 (×5): qty 1

## 2020-05-12 MED ORDER — ZOLPIDEM TARTRATE 5 MG PO TABS
5.0000 mg | ORAL_TABLET | Freq: Every evening | ORAL | Status: DC | PRN
  Administered 2020-05-12 – 2020-05-15 (×4): 5 mg via ORAL
  Filled 2020-05-12 (×4): qty 1

## 2020-05-12 NOTE — Progress Notes (Signed)
   Liver lesion biopsy requested per Dr Murvin Natal and Dr Delton Coombes  Imaging was reviewed with Dr Pascal Lux He recommends MRI Abd before we move forward with Bx  Pt had received contrast this am MD holding off on additional contrast for today  MRI scheduled for Wed am at [facility].  We will await result and review before approve biopsy  Dr Murvin Natal aware and agreeable

## 2020-05-12 NOTE — Progress Notes (Signed)
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   GOAL:  Patient will meet greater than or equal to 90% of their needs  MONITOR:   PO intake, Supplement acceptance, Labs, Weight trends REASON FOR ASSESSMENT:   Consult Assessment of nutrition requirement/status  ASSESSMENT: Patient is a pleasant 69 yo male with history of multiple sclerosis, blindness who presents complaining of weakness, anorexia and weight loss. CT abdomen findings- bilateral renal masses, adrenal and liver nodule. CT chest- right upper lobe mass / metastases per MD.    Patient has finished his lunch and has eaten ~ 25%. He completed his green beans and tea and says he is full. Recommend he choose calorie dense foods/fluids or oral nutrition supplements.  Staff is assisting with meals as needed. His appetite has not been good in recent weeks and he has been drinking a protein drink to help support intake. Able to feed himself some foods- prefers finger type foods.    Medications reviewed and include: Protonix.  IV NS@ 65 ml/hr   There is no weight history to review. Patient has not been to the Dr in years and does not know his usual weight. He knows his weight is down because his clothes are "falling off of him".    Labs: BMP Latest Ref Rng & Units 05/12/2020 05/11/2020  Glucose 70 - 99 mg/dL 93 109(H)  BUN 8 - 23 mg/dL 13 18  Creatinine 0.61 - 1.24 mg/dL 0.53(L) 0.66  Sodium 135 - 145 mmol/L 138 137  Potassium 3.5 - 5.1 mmol/L 4.0 4.3  Chloride 98 - 111 mmol/L 103 99    CO2 22 - 32 mmol/L 23 26  Calcium 8.9 - 10.3 mg/dL 9.3 9.6     NUTRITION - FOCUSED PHYSICAL EXAM:    Most Recent Value  Orbital Region Unable to assess  [pt wearing dark glasses]  Upper Arm Region Moderate depletion  Thoracic and Lumbar Region Severe depletion  Buccal Region Moderate depletion  Temple Region Moderate depletion  Clavicle Bone Region Severe depletion  Clavicle and Acromion Bone Region Moderate depletion  Scapular Bone Region Moderate depletion  Dorsal Hand Severe depletion  Edema (RD Assessment) None  Hair Reviewed  Eyes Unable to assess  Mouth Reviewed  Skin Reviewed  Nails Reviewed      Diet Order:   Diet Order            DIET SOFT Room service appropriate? Yes; Fluid consistency: Thin  Diet effective now                 EDUCATION NEEDS:  Education needs have been addressed  Skin:  Skin Assessment: Reviewed RN Assessment  Last BM:  10/31  Height:   Ht Readings from Last 1 Encounters:  05/11/20 5\' 9"  (1.753 m)    Weight:   Wt Readings from Last 1 Encounters:  05/11/20 68 kg    Ideal Body Weight:   Adj IBW-66 kg  BMI:  Body mass index is 22.15 kg/m.  Estimated Nutritional Needs:   Kcal:  2168-2310  Protein:  86-92 gr  Fluid:  ~ 2liters  daily   Colman Cater MS,RD,CSG,LDN Pager: Shea Evans

## 2020-05-12 NOTE — Consult Note (Signed)
[facility] Consultation Oncology  Name: Brandon Castillo      MRN: [id number]    Location: A320/A320-01  Date: 05/12/2020 Time:5:48 PM   REFERRING PHYSICIAN: Dr. Wynetta Emery  REASON FOR CONSULT: Lung and kidney masses   DIAGNOSIS: Likely metastatic lung cancer.  HISTORY OF PRESENT ILLNESS: Brandon Castillo is a 69 year old very pleasant white male who is seen in consultation today for further work-up and management of lung and abdominal masses.  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  He has not been to a doctor in the past few years.  He smoked 1 pack/day for about 30 years and quit smoking several years ago.  He has lost some weight in the past few months and reports that he lost appetite.  He lives at home by himself.  His brother checks on him and provides food for him.  He also reported some pain in the right upper quadrant.  Denied any fevers or chills at home.  Because of abdominal pain and generalized weakness he presented to the ER on 05/11/2020.  There is a 1.6 cm hypoattenuating mass in the hepatic segment suspicious for hemangioma.  Multiple hypoechoic masses in the right kidney with the largest measuring 6.2 cm.  Also a mass in the right renal pelvis and 2 hypoechoic masses in the left kidney.  There is also nodules in the left adrenal.  He underwent a CT scan of the chest measuring 3.2 cm spiculated right upper lobe mass invading the right hilum and mediastinum.  Scattered pulmonary nodules predominantly in the right lung.  Family history consistent with lung cancer in father and pancreatic cancer in his mother.  PAST MEDICAL HISTORY:   Past Medical History:  Diagnosis Date  . Multiple sclerosis (HCC)     ALLERGIES: Allergies  Allergen Reactions  . Demerol [Meperidine Hcl]       MEDICATIONS: I have reviewed the patient's current medications.     PAST SURGICAL HISTORY History reviewed. No pertinent surgical history.  FAMILY HISTORY: History reviewed. No pertinent family  history.  SOCIAL HISTORY:  reports that he has never smoked. He has never used smokeless tobacco. He reports previous alcohol use. He reports previous drug use.  PERFORMANCE STATUS: The patient's performance status is 2 - Symptomatic, <50% confined to bed  PHYSICAL EXAM: Most Recent Vital Signs: Blood pressure 130/72, pulse 78, temperature 98 F (36.7 C), temperature source Oral, resp. rate 15, height 5\' 9"  (1.753 m), weight 150 lb (68 kg), SpO2 99 %. BP 130/72   Pulse 78   Temp 98 F (36.7 C) (Oral)   Resp 15   Ht 5\' 9"  (1.753 m)   Wt 150 lb (68 kg)   SpO2 99%   BMI 22.15 kg/m  General appearance: alert, cooperative and appears stated age Head: Normocephalic, without obvious abnormality, atraumatic Lungs: clear to auscultation bilaterally Heart: regular rate and rhythm Abdomen: Soft, tenderness in the right upper and mid quadrants.  No palpable organomegaly. Extremities: No edema or cyanosis. Skin: Skin color, texture, turgor normal. No rashes or lesions Lymph nodes: Cervical, supraclavicular, and axillary nodes normal. Neurologic: Grossly normal  LABORATORY DATA:  Results for orders placed or performed during the hospital encounter of 05/11/20 (from the past 48 hour(s))  Urinalysis, Routine w reflex microscopic Urine, Random     Status: Abnormal   Collection Time: 05/11/20 10:41 AM  Result Value Ref Range   Color, Urine YELLOW YELLOW   APPearance CLEAR CLEAR   Specific Gravity, Urine 1.035 (  H) 1.005 - 1.030   pH 6.0 5.0 - 8.0   Glucose, UA NEGATIVE NEGATIVE mg/dL   Hgb urine dipstick SMALL (A) NEGATIVE   Bilirubin Urine NEGATIVE NEGATIVE   Ketones, ur 5 (A) NEGATIVE mg/dL   Protein, ur 30 (A) NEGATIVE mg/dL   Nitrite NEGATIVE NEGATIVE   Leukocytes,Ua TRACE (A) NEGATIVE   RBC / HPF 0-5 0 - 5 RBC/hpf   WBC, UA 6-10 0 - 5 WBC/hpf   Bacteria, UA FEW (A) NONE SEEN   Mucus PRESENT     Comment: Performed at [facility], [address] [place], [place] [address]  CBC  with Differential     Status: Abnormal   Collection Time: 05/11/20 10:47 AM  Result Value Ref Range   WBC 13.4 (H) 4.0 - 10.5 K/uL   RBC 3.97 (L) 4.22 - 5.81 MIL/uL   Hemoglobin 13.0 13.0 - 17.0 g/dL   HCT 40.8 39 - 52 %   MCV 102.8 (H) 80.0 - 100.0 fL   MCH 32.7 26.0 - 34.0 pg   MCHC 31.9 30.0 - 36.0 g/dL   RDW 12.4 11.5 - 15.5 %   Platelets 349 150 - 400 K/uL   nRBC 0.0 0.0 - 0.2 %   Neutrophils Relative % 82 %   Neutro Abs 10.8 (H) 1.7 - 7.7 K/uL   Lymphocytes Relative 10 %   Lymphs Abs 1.4 0.7 - 4.0 K/uL   Monocytes Relative 8 %   Monocytes Absolute 1.1 (H) 0.1 - 1.0 K/uL   Eosinophils Relative 0 %   Eosinophils Absolute 0.0 0.0 - 0.5 K/uL   Basophils Relative 0 %   Basophils Absolute 0.1 0.0 - 0.1 K/uL   Immature Granulocytes 0 %   Abs Immature Granulocytes 0.03 0.00 - 0.07 K/uL    Comment: Performed at [facility], [address] [place], [place] [address]  Comprehensive metabolic panel     Status: Abnormal   Collection Time: 05/11/20 10:47 AM  Result Value Ref Range   Sodium 137 135 - 145 mmol/L   Potassium 4.3 3.5 - 5.1 mmol/L   Chloride 99 98 - 111 mmol/L   CO2 26 22 - 32 mmol/L   Glucose, Bld 109 (H) 70 - 99 mg/dL    Comment: Glucose reference range applies only to samples taken after fasting for at least 8 hours.   BUN 18 8 - 23 mg/dL   Creatinine, Ser 0.66 0.61 - 1.24 mg/dL   Calcium 9.6 8.9 - 10.3 mg/dL   Total Protein 7.3 6.5 - 8.1 g/dL   Albumin 3.6 3.5 - 5.0 g/dL   AST 59 (H) 15 - 41 U/L   ALT 51 (H) 0 - 44 U/L   Alkaline Phosphatase 179 (H) 38 - 126 U/L   Total Bilirubin 0.5 0.3 - 1.2 mg/dL   GFR, Estimated >60 >60 mL/min    Comment: (NOTE) Calculated using the CKD-EPI Creatinine Equation (2021)    Anion gap 12 5 - 15    Comment: Performed at [facility], [address], [place], [place] [address]  Lipase, blood     Status: None   Collection Time: 05/11/20 10:47 AM  Result Value Ref Range   Lipase 21 11 - 51 U/L    Comment: Performed at [facility], [address] [place], [place] [address]  Respiratory Panel by RT PCR (Flu A&B, Covid) - Nasopharyngeal Swab     Status: None   Collection Time: 05/11/20  1:21 PM   Specimen:  Nasopharyngeal Swab  Result Value Ref Range   SARS Coronavirus 2 by RT PCR NEGATIVE NEGATIVE    Comment: (NOTE) SARS-CoV-2 target nucleic acids are NOT DETECTED.  The SARS-CoV-2 RNA is generally detectable in upper respiratoy specimens during the acute phase of infection. The lowest concentration of SARS-CoV-2 viral copies this assay can detect is 131 copies/mL. A negative result does not preclude SARS-Cov-2 infection and should not be used as the sole basis for treatment or other patient management decisions. A negative result may occur with  improper specimen collection/handling, submission of specimen other than nasopharyngeal swab, presence of viral mutation(s) within the areas targeted by this assay, and inadequate number of viral copies (<131 copies/mL). A negative result must be combined with clinical observations, patient history, and epidemiological information. The expected result is Negative.  Fact Sheet for Patients:  PinkCheek.be  Fact Sheet for Healthcare Providers:  GravelBags.it  This test is no t yet approved or cleared by the [place] FDA and  has been authorized for detection and/or diagnosis of SARS-CoV-2 by FDA under an Emergency Use Authorization (EUA). This EUA will remain  in effect (meaning this test can be used) for the duration of the COVID-19 declaration under Section 564(b)(1) of the Act, 21 U.S.C. section 360bbb-3(b)(1), unless the authorization is terminated or revoked sooner.     Influenza A by PCR NEGATIVE NEGATIVE   Influenza B by PCR NEGATIVE NEGATIVE    Comment: (NOTE) The Xpert Xpress SARS-CoV-2/FLU/RSV assay is intended as an aid in  the diagnosis of influenza from Nasopharyngeal swab specimens  and  should not be used as a sole basis for treatment. Nasal washings and  aspirates are unacceptable for Xpert Xpress SARS-CoV-2/FLU/RSV  testing.  Fact Sheet for Patients: PinkCheek.be  Fact Sheet for Healthcare Providers: GravelBags.it  This test is not yet approved or cleared by the [place] FDA and  has been authorized for detection and/or diagnosis of SARS-CoV-2 by  FDA under an Emergency Use Authorization (EUA). This EUA will remain  in effect (meaning this test can be used) for the duration of the  Covid-19 declaration under Section 564(b)(1) of the Act, 21  U.S.C. section 360bbb-3(b)(1), unless the authorization is  terminated or revoked. Performed at [facility], [address], [place], [place] [address]   HIV Antibody (routine testing w rflx)     Status: None   Collection Time: 05/11/20  4:28 PM  Result Value Ref Range   HIV Screen 4th Generation wRfx Non Reactive Non Reactive    Comment: Performed at [facility], [place] [address] [place], [place] [address]  Comprehensive metabolic panel     Status: Abnormal   Collection Time: 05/12/20  4:28 AM  Result Value Ref Range   Sodium 138 135 - 145 mmol/L   Potassium 4.0 3.5 - 5.1 mmol/L   Chloride 103 98 - 111 mmol/L   CO2 23 22 - 32 mmol/L   Glucose, Bld 93 70 - 99 mg/dL    Comment: Glucose reference range applies only to samples taken after fasting for at least 8 hours.   BUN 13 8 - 23 mg/dL   Creatinine, Ser 0.53 (L) 0.61 - 1.24 mg/dL   Calcium 9.3 8.9 - 10.3 mg/dL   Total Protein 6.5 6.5 - 8.1 g/dL   Albumin 3.1 (L) 3.5 - 5.0 g/dL   AST 51 (H) 15 - 41 U/L   ALT 55 (H) 0 - 44 U/L   Alkaline Phosphatase 153 (H) 38 -  126 U/L   Total Bilirubin 0.9 0.3 - 1.2 mg/dL   GFR, Estimated >60 >60 mL/min    Comment: (NOTE) Calculated using the CKD-EPI Creatinine Equation (2021)    Anion gap 12 5 - 15    Comment: Performed at [facility], [address] [place], [place] [address]  Magnesium     Status: None   Collection Time: 05/12/20  4:28 AM  Result Value Ref Range   Magnesium 2.0 1.7 - 2.4 mg/dL    Comment: Performed at [facility], [address] [place], [place] [address]  CBC WITH DIFFERENTIAL     Status: Abnormal   Collection Time: 05/12/20  4:28 AM  Result Value Ref Range   WBC 10.2 4.0 - 10.5 K/uL   RBC 3.68 (L) 4.22 - 5.81 MIL/uL   Hemoglobin 11.8 (L) 13.0 - 17.0 g/dL   HCT 37.6 (L) 39 - 52 %   MCV 102.2 (H) 80.0 - 100.0 fL   MCH 32.1 26.0 - 34.0 pg   MCHC 31.4 30.0 - 36.0 g/dL   RDW 12.2 11.5 - 15.5 %   Platelets 344 150 - 400 K/uL   nRBC 0.0 0.0 - 0.2 %   Neutrophils Relative % 75 %   Neutro Abs 7.5 1.7 - 7.7 K/uL   Lymphocytes Relative 16 %   Lymphs Abs 1.6 0.7 - 4.0 K/uL   Monocytes Relative 9 %   Monocytes Absolute 0.9 0.1 - 1.0 K/uL   Eosinophils Relative 0 %   Eosinophils Absolute 0.0 0.0 - 0.5 K/uL   Basophils Relative 0 %   Basophils Absolute 0.0 0.0 - 0.1 K/uL   Immature Granulocytes 0 %   Abs Immature Granulocytes 0.04 0.00 - 0.07 K/uL    Comment: Performed at [facility], [address] [place], [place] [address]      RADIOGRAPHY: DG Chest 2 View  Result Date: 05/11/2020 CLINICAL DATA:  Chest pain. EXAM: CHEST - 2 VIEW COMPARISON:  None. FINDINGS: The heart is normal in size. Mild tortuosity and calcification of the thoracic aorta. Slight ill-defined density in the right suprahilar/right upper lobe region. This is indeterminate finding but chest CT suggested to further evaluate. Lungs demonstrate hyperinflation and emphysematous changes. No pleural effusions. IMPRESSION: 1. Ill-defined right suprahilar/right upper lobe density. Chest CT suggested for further evaluate. 2. Underlying emphysematous changes. Electronically Signed   By: Marijo Sanes M.D.   On: 05/11/2020 13:26   CT Chest Wo Contrast  Result Date: 05/11/2020 CLINICAL DATA:  Abnormal chest x-ray. EXAM: CT CHEST WITHOUT CONTRAST TECHNIQUE:  Multidetector CT imaging of the chest was performed following the standard protocol without IV contrast. COMPARISON:  Chest x-ray, same date. FINDINGS: Cardiovascular: The heart is normal in size. No pericardial effusion. Mild tortuosity and ectasia of the thoracic aorta with scattered atherosclerotic calcifications. Scattered coronary artery calcifications. Mediastinum/Nodes: Right upper lobe lung mass is invading the right hilum and right mediastinum. I do not see any discrete measurable mediastinal lymph nodes. The esophagus is grossly normal. Lungs/Pleura: 3.2 cm spiculated right upper lobe lung mass medially invading the right hilum and the right mediastinum. Findings consistent with primary lung neoplasm. Advanced emphysematous changes and areas of pulmonary scarring. There are several scattered subpleural pulmonary nodules noted which are indeterminate. None of these measures more than 5 mm. Metastatic disease is certainly a consideration. No acute pulmonary findings.  No pleural effusions. Upper Abdomen: Bilateral adrenal gland nodules and upper pole right renal lesion as seen on today's abdominal/pelvic CT  scan. Musculoskeletal: No worrisome bone lesions to suggest metastatic bone disease. IMPRESSION: 1. 3.2 cm spiculated right upper lobe lung mass invading the right hilum and right mediastinum consistent with primary lung neoplasm. 2. Several scattered subpleural pulmonary nodules are indeterminate. Metastatic disease is certainly a consideration. 3. Advanced emphysematous changes and pulmonary scarring. 4. Bilateral adrenal gland nodules and upper pole right renal lesion as seen on today's abdominal/pelvic CT scan. 5. No findings for osseous metastatic disease. 6. Emphysema and aortic atherosclerosis. Aortic Atherosclerosis (ICD10-I70.0) and Emphysema (ICD10-J43.9). Electronically Signed   By: Marijo Sanes M.D.   On: 05/11/2020 14:26   MR BRAIN W WO CONTRAST  Result Date: 05/12/2020 CLINICAL DATA:   Lung mass EXAM: MRI HEAD WITHOUT AND WITH CONTRAST TECHNIQUE: Multiplanar, multiecho pulse sequences of the brain and surrounding structures were obtained without and with intravenous contrast. CONTRAST:  75mL GADAVIST GADOBUTROL 1 MMOL/ML IV SOLN COMPARISON:  None. FINDINGS: Brain: There is no acute infarction or intracranial hemorrhage. There is no intracranial mass, mass effect, or edema. There is no hydrocephalus or extra-axial fluid collection. Prominence of the ventricles and sulci reflects minor generalized parenchymal volume loss. There is an incidental small right frontal developmental venous anomaly. 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 Patchy T2 hyperintensity in the supratentorial white matter is nonspecific but may reflect minor chronic microvascular ischemic changes. Vascular: Major vessel flow voids at the skull base are preserved. Skull and upper cervical spine: Normal marrow signal is preserved. Sinuses/Orbits: Paranasal sinuses are aerated. Orbits are unremarkable. Other: Sella is unremarkable.  Mastoid air cells are clear. IMPRESSION: No evidence of intracranial metastatic disease. Chronic/nonemergent findings detailed above. Electronically Signed   By: Macy Mis M.D.   On: 05/12/2020 10:19   CT Abdomen Pelvis W Contrast  Addendum Date: 05/11/2020   ADDENDUM REPORT: 05/11/2020 13:43 ADDENDUM: Of note, an alternative differential consideration for the bilateral renal masses is renal abscesses. The hypoechoic mass in the liver could represent a benign hemangioma, and MR abdomen according could be considered for further evaluation as part of the patient's workup. These results were called by telephone at the time of interpretation on 05/11/2020 at 1:42 pm to provider [facility] , who verbally acknowledged these results. Electronically Signed   By: Zerita Boers M.D.   On: 05/11/2020 13:43   Result Date: 05/11/2020 CLINICAL DATA:  Lower  abdominal pain and generalized weakness for 2 weeks. EXAM: CT ABDOMEN AND PELVIS WITH CONTRAST TECHNIQUE: Multidetector CT imaging of the abdomen and pelvis was performed using the standard protocol following bolus administration of intravenous contrast. CONTRAST:  48mL OMNIPAQUE IOHEXOL 300 MG/ML  SOLN COMPARISON:  Report from abdominal radiograph dated 03/05/2002. FINDINGS: Lower chest: No acute abnormality. Hepatobiliary: There is a 1.6 cm hypoattenuating mass in hepatic segment 5/4B near the gallbladder fossa. No gallstones, gallbladder wall thickening, or biliary dilatation. Pancreas: Unremarkable. No pancreatic ductal dilatation or surrounding inflammatory changes. Spleen: Normal in size without focal abnormality. Adrenals/Urinary Tract: Two left adrenal nodules measure approximately 0.8 and 0.9 cm (series 5, image 49 and image 46). The right adrenal gland appears normal. Multiple indistinct hypoechoic masses enlarged the right kidney with the largest measuring 6.2 cm in diameter. A mass is seen in the right renal pelvis, measuring 2.9 cm (series 7, image 16). At least two hypoechoic masses are seen in the left kidney, measuring up to 1.7 cm. A 7 mm mass in the lateral right kidney likely represents a benign cyst (series 2, image 33). There is  no hydronephrosis. Bladder is unremarkable. Stomach/Bowel: Stomach is within normal limits. Appendix appears normal. There is colonic diverticulosis without evidence of diverticulitis. No evidence of bowel wall thickening, distention, or inflammatory changes. Vascular/Lymphatic: Aortic atherosclerosis. The inferior vena cava and both renal veins appear normal. No enlarged abdominal or pelvic lymph nodes. Reproductive: The prostate is mildly enlarged, measuring 5.1 cm in transverse dimension Other: No abdominal wall hernia or abnormality. No abdominopelvic ascites. Musculoskeletal: Degenerative changes are seen in the spine. IMPRESSION: 1. Multiple hypoechoic masses in  both kidneys as well as a mass in the right renal pelvis. Differential considerations include renal cell carcinoma, transitional cell carcinoma, renal lymphoma, and metastatic disease. Indeterminate hypoattenuating mass in the liver and two left adrenal nodules may represent metastatic disease. CT abdomen pelvis according to a renal protocol could be considered for further evaluation. Aortic Atherosclerosis (ICD10-I70.0). Electronically Signed: By: Zerita Boers M.D. On: 05/11/2020 12:28   US Venous Img Lower Bilateral (DVT)  Result Date: 05/12/2020 CLINICAL DATA:  Bilateral lower extremity pain.  Evaluate for DVT. EXAM: BILATERAL LOWER EXTREMITY VENOUS DOPPLER ULTRASOUND TECHNIQUE: Gray-scale sonography with graded compression, as well as color Doppler and duplex ultrasound were performed to evaluate the lower extremity deep venous systems from the level of the common femoral vein and including the common femoral, femoral, profunda femoral, popliteal and calf veins including the posterior tibial, peroneal and gastrocnemius veins when visible. The superficial great saphenous vein was also interrogated. Spectral Doppler was utilized to evaluate flow at rest and with distal augmentation maneuvers in the common femoral, femoral and popliteal veins. COMPARISON:  None. FINDINGS: RIGHT LOWER EXTREMITY Common Femoral Vein: No evidence of thrombus. Normal compressibility, respiratory phasicity and response to augmentation. Saphenofemoral Junction: No evidence of thrombus. Normal compressibility and flow on color Doppler imaging. Profunda Femoral Vein: No evidence of thrombus. Normal compressibility and flow on color Doppler imaging. Femoral Vein: No evidence of thrombus. Normal compressibility, respiratory phasicity and response to augmentation. Popliteal Vein: No evidence of thrombus. Normal compressibility, respiratory phasicity and response to augmentation. Calf Veins: No evidence of thrombus. Normal compressibility  and flow on color Doppler imaging. Superficial Great Saphenous Vein: No evidence of thrombus. Normal compressibility. Venous Reflux:  None. Other Findings:  None. LEFT LOWER EXTREMITY Common Femoral Vein: No evidence of thrombus. Normal compressibility, respiratory phasicity and response to augmentation. Saphenofemoral Junction: No evidence of thrombus. Normal compressibility and flow on color Doppler imaging. Profunda Femoral Vein: No evidence of thrombus. Normal compressibility and flow on color Doppler imaging. Femoral Vein: No evidence of thrombus. Normal compressibility, respiratory phasicity and response to augmentation. Popliteal Vein: No evidence of thrombus. Normal compressibility, respiratory phasicity and response to augmentation. Calf Veins: No evidence of thrombus. Normal compressibility and flow on color Doppler imaging. Superficial Great Saphenous Vein: No evidence of thrombus. Normal compressibility. Venous Reflux:  None. Other Findings:  None. IMPRESSION: No evidence of DVT within either lower extremity. Electronically Signed   By: Sandi Mariscal M.D.   On: 05/12/2020 17:38       ASSESSMENT and PLAN:  1.  Right upper lobe lung mass: -Ex-smoker, smoked 1 pack/day for 30 years and quit several years ago. -History of exposure to agent orange while working in [employer]. -CT chest on 05/11/2020 shows 3.2 cm spiculated right upper lobe mass invading the right hilum and right mediastinum with several subcentimeter pulmonary nodules. -CT abdomen showed bilateral adrenal nodules and kidney masses.  Questionable liver lesion with differential including hemangioma. -MRI of the brain on 05/12/2020  did not show any evidence of intracranial metastatic disease. -Would recommend evaluation by pulmonary team for bronchoscopy and biopsy.  2.  Bilateral renal masses: -Differential diagnosis including renal abscesses versus malignancy was reported per nephrology. -He does not have any evidence of fevers or  chills.  White count is also normal today. -Agree with abdominal MRI which will give more information.  3.  Multiple sclerosis: -He had a history of MS with vision loss.  He does not have any acute flares at this time.  4.  Elevated LFTs: -We will check MRI of the liver.  All questions were answered. The patient knows to call the clinic with any problems, questions or concerns. We can certainly see the patient much sooner if necessary.    Derek Jack

## 2020-05-12 NOTE — Progress Notes (Signed)
PROGRESS NOTE   Brandon Castillo  TMH:[id number] DOB: 1951-03-14 DOA: 05/11/2020 PCP: Patient, No Pcp Per   Chief Complaint  Patient presents with  . Abdominal Pain    Brief Admission History:  69 y.o. 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  He has not seen a regular physician since he was a child.  He says that he has been hospitalized on rare occasions many years ago but not seeing any regular providers.  He reports that he is a former heavy smoker.  He smoked 1 pack/day for about 30 years.  He says that he quit smoking several years ago.  Patient says that he has never had a colon cancer screening and no colonoscopy or EGD.  He says that he lives with his brother who serves as his primary caretaker.  He says that for the past month he has been experiencing decreasing appetite, weight loss, generalized malaise, generalized abdominal pain, fatigue and pain in the left groin and lower abdomen and difficulty urinating.  He also reports that he has been dealing with a growth on his scrotum that affects him when he tries to sit down.  He has been having occasional fever and chills.  He presented to the emergency department with his brother today because they have been concerned about his abnormal weight loss, abdominal pain generalized weakness and frailty.  CT abdomen was done with findings of bilateral renal masses, liver nodule, bilateral adrenal nodules and a right upper lobe density.  CT chest without contrast with findings of a right upper lobe mass which is suspicious for primary lung neoplasm with metastases seen and noted.  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  IR evaluation requested for possible tissue biopsy of liver lesion.  Due to receiving contrast earlier today.  MRI abdomen will have to be done Wednesday morning per MRI team. Will arrange for MRI abdomen on 11/3.  IR will evaluate and determine if he can have biopsy done after reviewing MRI abdomen.  2. Abnormal weight loss - secondary to metastatic cancer - consult to dietitian for recommendations.  3. Elevated LFTs - secondary to liver metastases - Follow.  4. COPD/Emphysema - former heavy smoker - bronchodilators ordered as needed.  5. Difficulty urinating - monitor urine output, cath as needed.  6. Leukocytosis - likely reactive, RESOLVED.   7. Generalized anxiety - lorazepam ordered as needed.  8. Generalized abdominal pain and cancer pain - Oral and IV pain medications ordered as needed.  9. Sinus tachycardia - secondary to metastatic cancer - treating supportively.  Check venous dopplers of legs.  10. Scrotal sebaceous cyst - refer to general surgery when he has been stabilized for removal of the cyst.  11. Nausea and vomiting -IV nausea medication ordered as needed.  12. Multiple  Sclerosis - Pt reports that he is not followed for this and has not seen a provider in many years.  28. Blindness - Brother plans to stay with patient as much as possible to assist patient with ADLs.  14. Insomnia - add sleep aid. 15. Anemia in neoplastic disease - Hg down with hemodilution, following.     DVT prophylaxis: SQ heparin   Code Status: Full   Family Communication: brother at bedside   Disposition Plan: TBD   Consults called: oncology Delton [place]es)  Admission status: INP     Status is: Inpatient  Remains inpatient appropriate because:IV treatments appropriate due to intensity of illness or inability to take PO and Inpatient level of care appropriate due to severity of illness   Dispo: The patient is from: Home              Anticipated d/c is to: Home              Anticipated d/c date is: 3 days              Patient currently is not medically stable to d/c.  Consultants:   Medical oncology  Interventional radiology   Procedures:     Antimicrobials:       Subjective: Pt says he is having a difficult time sleeping in hospital.  Objective: Vitals:   05/11/20 1804 05/11/20 2007 05/11/20 2346 05/12/20 0418  BP: (!) 151/72 127/68 132/68 133/76  Pulse: 91 79 79 77  Resp: 18 16 18 16   Temp: 98.5 F (36.9 C) 99.6 F (37.6 C) 98.3 F (36.8 C) 98 F (36.7 C)  TempSrc: Oral Oral Oral Oral  SpO2: 98% 95% 96% 99%  Weight:      Height:        Intake/Output Summary (Last 24 hours) at 05/12/2020 1551 Last data filed at 05/11/2020 2315 Gross per 24 hour  Intake 274.55 ml  Output 400 ml  Net -125.45 ml   Filed Weights   05/11/20 1030  Weight: 68 kg    Examination:  General exam: Blind. Cachectic frail appearing male with dark glasses.  Appears calm and comfortable  Respiratory system: Clear to auscultation. Respiratory effort normal. Cardiovascular system: normal S1 & S2 heard. No JVD, murmurs, rubs, gallops or clicks. No pedal edema. Gastrointestinal system: Abdomen is mildly distended with suprapubic tenderness. No organomegaly or masses felt. Normal bowel sounds heard. Central nervous system: Alert and oriented. No focal neurological deficits. Extremities: Symmetric 5 x 5 power. Skin: No rashes, lesions or ulcers  Psychiatry: Judgement and insight appear normal. Mood & affect appropriate.   Data Reviewed: I have personally reviewed following labs and imaging studies  CBC: Recent Labs  Lab 05/11/20 1047 05/12/20 0428  WBC 13.4*  10.2  NEUTROABS 10.8* 7.5  HGB 13.0 11.8*  HCT 40.8 37.6*  MCV 102.8* 102.2*  PLT 349 557    Basic Metabolic Panel: Recent Labs  Lab 05/11/20 1047 05/12/20 0428  NA 137 138  K 4.3 4.0  CL 99 103  CO2 26 23  GLUCOSE 109* 93  BUN 18 13  CREATININE 0.66 0.53*  CALCIUM 9.6 9.3  MG  --  2.0    GFR: Estimated Creatinine Clearance: 85 mL/min (A) (by C-G formula based on SCr of 0.53 mg/dL (L)).  Liver Function Tests: Recent Labs  Lab 05/11/20 1047 05/12/20 0428  AST 59* 51*  ALT 51* 55*  ALKPHOS 179* 153*  BILITOT 0.5 0.9  PROT 7.3 6.5  ALBUMIN 3.6 3.1*  CBG: No results for input(s): GLUCAP in the last 168 hours.  Recent Results (from the past 240 hour(s))  Respiratory Panel by RT PCR (Flu A&B, Covid) - Nasopharyngeal Swab     Status: None   Collection Time: 05/11/20  1:21 PM   Specimen: Nasopharyngeal Swab  Result Value Ref Range Status   SARS Coronavirus 2 by RT PCR NEGATIVE NEGATIVE Final    Comment: (NOTE) SARS-CoV-2 target nucleic acids are NOT DETECTED.  The SARS-CoV-2 RNA is generally detectable in upper respiratoy specimens during the acute phase of infection. The lowest concentration of SARS-CoV-2 viral copies this assay can detect is 131 copies/mL. A negative result does not preclude SARS-Cov-2 infection and should not be used as the sole basis for treatment or other patient management decisions. A negative result may occur with  improper specimen collection/handling, submission of specimen other than nasopharyngeal swab, presence of viral mutation(s) within the areas targeted by this assay, and inadequate number of viral copies (<131 copies/mL). A negative result must be combined with clinical observations, patient history, and epidemiological information. The expected result is Negative.  Fact Sheet for Patients:  PinkCheek.be  Fact Sheet for Healthcare Providers:  GravelBags.it  This  test is no t yet approved or cleared by the [place] FDA and  has been authorized for detection and/or diagnosis of SARS-CoV-2 by FDA under an Emergency Use Authorization (EUA). This EUA will remain  in effect (meaning this test can be used) for the duration of the COVID-19 declaration under Section 564(b)(1) of the Act, 21 U.S.C. section 360bbb-3(b)(1), unless the authorization is terminated or revoked sooner.     Influenza A by PCR NEGATIVE NEGATIVE Final   Influenza B by PCR NEGATIVE NEGATIVE Final    Comment: (NOTE) The Xpert Xpress SARS-CoV-2/FLU/RSV assay is intended as an aid in  the diagnosis of influenza from Nasopharyngeal swab specimens and  should not be used as a sole basis for treatment. Nasal washings and  aspirates are unacceptable for Xpert Xpress SARS-CoV-2/FLU/RSV  testing.  Fact Sheet for Patients: PinkCheek.be  Fact Sheet for Healthcare Providers: GravelBags.it  This test is not yet approved or cleared by the [place] FDA and  has been authorized for detection and/or diagnosis of SARS-CoV-2 by  FDA under an Emergency Use Authorization (EUA). This EUA will remain  in effect (meaning this test can be used) for the duration of the  Covid-19 declaration under Section 564(b)(1) of the Act, 21  U.S.C. section 360bbb-3(b)(1), unless the authorization is  terminated or revoked. Performed at [facility], [address] [place],  [address]      Radiology Studies: DG Chest 2 View  Result Date: 05/11/2020 CLINICAL DATA:  Chest pain. EXAM: CHEST - 2 VIEW COMPARISON:  None. FINDINGS: The heart is normal in size. Mild tortuosity and calcification of the thoracic aorta. Slight ill-defined density in the right suprahilar/right upper lobe region. This is indeterminate finding but chest CT suggested to further evaluate. Lungs demonstrate hyperinflation and emphysematous changes. No pleural effusions.  IMPRESSION: 1. Ill-defined right suprahilar/right upper lobe density. Chest CT suggested for further evaluate. 2. Underlying emphysematous changes. Electronically Signed   By: Marijo Sanes M.D.   On: 05/11/2020 13:26   CT Chest Wo Contrast  Result Date: 05/11/2020 CLINICAL DATA:  Abnormal chest x-ray. EXAM: CT CHEST WITHOUT CONTRAST TECHNIQUE: Multidetector CT imaging of the chest was performed following the standard protocol without IV contrast. COMPARISON:  Chest x-ray, same date. FINDINGS: Cardiovascular: The heart is normal  in size. No pericardial effusion. Mild tortuosity and ectasia of the thoracic aorta with scattered atherosclerotic calcifications. Scattered coronary artery calcifications. Mediastinum/Nodes: Right upper lobe lung mass is invading the right hilum and right mediastinum. I do not see any discrete measurable mediastinal lymph nodes. The esophagus is grossly normal. Lungs/Pleura: 3.2 cm spiculated right upper lobe lung mass medially invading the right hilum and the right mediastinum. Findings consistent with primary lung neoplasm. Advanced emphysematous changes and areas of pulmonary scarring. There are several scattered subpleural pulmonary nodules noted which are indeterminate. None of these measures more than 5 mm. Metastatic disease is certainly a consideration. No acute pulmonary findings.  No pleural effusions. Upper Abdomen: Bilateral adrenal gland nodules and upper pole right renal lesion as seen on today's abdominal/pelvic CT scan. Musculoskeletal: No worrisome bone lesions to suggest metastatic bone disease. IMPRESSION: 1. 3.2 cm spiculated right upper lobe lung mass invading the right hilum and right mediastinum consistent with primary lung neoplasm. 2. Several scattered subpleural pulmonary nodules are indeterminate. Metastatic disease is certainly a consideration. 3. Advanced emphysematous changes and pulmonary scarring. 4. Bilateral adrenal gland nodules and upper pole right  renal lesion as seen on today's abdominal/pelvic CT scan. 5. No findings for osseous metastatic disease. 6. Emphysema and aortic atherosclerosis. Aortic Atherosclerosis (ICD10-I70.0) and Emphysema (ICD10-J43.9). Electronically Signed   By: Marijo Sanes M.D.   On: 05/11/2020 14:26   MR BRAIN W WO CONTRAST  Result Date: 05/12/2020 CLINICAL DATA:  Lung mass EXAM: MRI HEAD WITHOUT AND WITH CONTRAST TECHNIQUE: Multiplanar, multiecho pulse sequences of the brain and surrounding structures were obtained without and with intravenous contrast. CONTRAST:  77mL GADAVIST GADOBUTROL 1 MMOL/ML IV SOLN COMPARISON:  None. FINDINGS: Brain: There is no acute infarction or intracranial hemorrhage. There is no intracranial mass, mass effect, or edema. There is no hydrocephalus or extra-axial fluid collection. Prominence of the ventricles and sulci reflects minor generalized parenchymal volume loss. There is an incidental small right frontal developmental venous anomaly. 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 Patchy T2 hyperintensity in the supratentorial white matter is nonspecific but may reflect minor chronic microvascular ischemic changes. Vascular: Major vessel flow voids at the skull base are preserved. Skull and upper cervical spine: Normal marrow signal is preserved. Sinuses/Orbits: Paranasal sinuses are aerated. Orbits are unremarkable. Other: Sella is unremarkable.  Mastoid air cells are clear. IMPRESSION: No evidence of intracranial metastatic disease. Chronic/nonemergent findings detailed above. Electronically Signed   By: Macy Mis M.D.   On: 05/12/2020 10:19   CT Abdomen Pelvis W Contrast  Addendum Date: 05/11/2020   ADDENDUM REPORT: 05/11/2020 13:43 ADDENDUM: Of note, an alternative differential consideration for the bilateral renal masses is renal abscesses. The hypoechoic mass in the liver could represent a benign hemangioma, and MR abdomen according could be  considered for further evaluation as part of the patient's workup. These results were called by telephone at the time of interpretation on 05/11/2020 at 1:42 pm to provider [facility] , who verbally acknowledged these results. Electronically Signed   By: Zerita Boers M.D.   On: 05/11/2020 13:43   Result Date: 05/11/2020 CLINICAL DATA:  Lower abdominal pain and generalized weakness for 2 weeks. EXAM: CT ABDOMEN AND PELVIS WITH CONTRAST TECHNIQUE: Multidetector CT imaging of the abdomen and pelvis was performed using the standard protocol following bolus administration of intravenous contrast. CONTRAST:  29mL OMNIPAQUE IOHEXOL 300 MG/ML  SOLN COMPARISON:  Report from abdominal radiograph dated 03/05/2002. FINDINGS: Lower chest: No acute abnormality. Hepatobiliary:  There is a 1.6 cm hypoattenuating mass in hepatic segment 5/4B near the gallbladder fossa. No gallstones, gallbladder wall thickening, or biliary dilatation. Pancreas: Unremarkable. No pancreatic ductal dilatation or surrounding inflammatory changes. Spleen: Normal in size without focal abnormality. Adrenals/Urinary Tract: Two left adrenal nodules measure approximately 0.8 and 0.9 cm (series 5, image 49 and image 46). The right adrenal gland appears normal. Multiple indistinct hypoechoic masses enlarged the right kidney with the largest measuring 6.2 cm in diameter. A mass is seen in the right renal pelvis, measuring 2.9 cm (series 7, image 16). At least two hypoechoic masses are seen in the left kidney, measuring up to 1.7 cm. A 7 mm mass in the lateral right kidney likely represents a benign cyst (series 2, image 33). There is no hydronephrosis. Bladder is unremarkable. Stomach/Bowel: Stomach is within normal limits. Appendix appears normal. There is colonic diverticulosis without evidence of diverticulitis. No evidence of bowel wall thickening, distention, or inflammatory changes. Vascular/Lymphatic: Aortic atherosclerosis. The inferior vena cava  and both renal veins appear normal. No enlarged abdominal or pelvic lymph nodes. Reproductive: The prostate is mildly enlarged, measuring 5.1 cm in transverse dimension Other: No abdominal wall hernia or abnormality. No abdominopelvic ascites. Musculoskeletal: Degenerative changes are seen in the spine. IMPRESSION: 1. Multiple hypoechoic masses in both kidneys as well as a mass in the right renal pelvis. Differential considerations include renal cell carcinoma, transitional cell carcinoma, renal lymphoma, and metastatic disease. Indeterminate hypoattenuating mass in the liver and two left adrenal nodules may represent metastatic disease. CT abdomen pelvis according to a renal protocol could be considered for further evaluation. Aortic Atherosclerosis (ICD10-I70.0). Electronically Signed: By: Zerita Boers M.D. On: 05/11/2020 12:28     Scheduled Meds: . heparin  5,000 Units Subcutaneous Q8H  . pantoprazole  40 mg Oral Daily   Continuous Infusions: . sodium chloride 65 mL/hr at 05/11/20 1531     LOS: 1 day   Time spent: 35 mins  Sona Nations Wynetta Emery, MD How to contact the [facility] Attending or Consulting provider [place] or covering provider during after hours [place], for this patient?  1. Check the care team in [facility] and look for a) attending/consulting [facility] provider listed and b) the [facility] team listed 2. Log into [email or web address] and use [place]'s universal password to access. If you do not have the password, please contact the hospital operator. 3. Locate the [facility] provider you are looking for under [facility] and page to a number that you can be directly reached. 4. If you still have difficulty reaching the provider, please page the [facility] (Director on Call) for the Hospitalists listed on amion for assistance.  05/12/2020, 3:51 PM

## 2020-05-13 ENCOUNTER — Inpatient Hospital Stay (HOSPITAL_COMMUNITY): Payer: Medicare HMO

## 2020-05-13 DIAGNOSIS — J439 Emphysema, unspecified: Secondary | ICD-10-CM

## 2020-05-13 DIAGNOSIS — R918 Other nonspecific abnormal finding of lung field: Secondary | ICD-10-CM | POA: Diagnosis not present

## 2020-05-13 DIAGNOSIS — E278 Other specified disorders of adrenal gland: Secondary | ICD-10-CM | POA: Diagnosis not present

## 2020-05-13 DIAGNOSIS — R627 Adult failure to thrive: Secondary | ICD-10-CM | POA: Diagnosis not present

## 2020-05-13 LAB — COMPREHENSIVE METABOLIC PANEL
ALT: 31 U/L (ref 0–44)
AST: 24 U/L (ref 15–41)
Albumin: 3 g/dL — ABNORMAL LOW (ref 3.5–5.0)
Alkaline Phosphatase: 130 U/L — ABNORMAL HIGH (ref 38–126)
Anion gap: 10 (ref 5–15)
BUN: 13 mg/dL (ref 8–23)
CO2: 24 mmol/L (ref 22–32)
Calcium: 9.2 mg/dL (ref 8.9–10.3)
Chloride: 102 mmol/L (ref 98–111)
Creatinine, Ser: 0.55 mg/dL — ABNORMAL LOW (ref 0.61–1.24)
GFR, Estimated: >60 mL/min (ref >60)
Glucose, Bld: 115 mg/dL — ABNORMAL HIGH (ref 70–99)
Potassium: 3.7 mmol/L (ref 3.5–5.1)
Sodium: 136 mmol/L (ref 135–145)
Total Bilirubin: 0.7 mg/dL (ref 0.3–1.2)
Total Protein: 6.4 g/dL — ABNORMAL LOW (ref 6.5–8.1)

## 2020-05-13 LAB — MAGNESIUM: Magnesium: 1.9 mg/dL (ref 1.7–2.4)

## 2020-05-13 LAB — CBC WITH DIFFERENTIAL/PLATELET
Abs Immature Granulocytes: 0.03 10*3/uL (ref 0.00–0.07)
Basophils Absolute: 0 10*3/uL (ref 0.0–0.1)
Basophils Relative: 0 %
Eosinophils Absolute: 0.1 10*3/uL (ref 0.0–0.5)
Eosinophils Relative: 1 %
HCT: 38 % — ABNORMAL LOW (ref 39.0–52.0)
Hemoglobin: 12 g/dL — ABNORMAL LOW (ref 13.0–17.0)
Immature Granulocytes: 0 %
Lymphocytes Relative: 19 %
Lymphs Abs: 1.8 10*3/uL (ref 0.7–4.0)
MCH: 32.3 pg (ref 26.0–34.0)
MCHC: 31.6 g/dL (ref 30.0–36.0)
MCV: 102.4 fL — ABNORMAL HIGH (ref 80.0–100.0)
Monocytes Absolute: 0.8 10*3/uL (ref 0.1–1.0)
Monocytes Relative: 8 %
Neutro Abs: 6.9 10*3/uL (ref 1.7–7.7)
Neutrophils Relative %: 72 %
Platelets: 339 10*3/uL (ref 150–400)
RBC: 3.71 MIL/uL — ABNORMAL LOW (ref 4.22–5.81)
RDW: 12.1 % (ref 11.5–15.5)
WBC: 9.7 10*3/uL (ref 4.0–10.5)
nRBC: 0 % (ref 0.0–0.2)

## 2020-05-13 MED ORDER — GADOBUTROL 1 MMOL/ML IV SOLN
7.0000 mL | Freq: Once | INTRAVENOUS | Status: AC | PRN
  Administered 2020-05-13: 7 mL via INTRAVENOUS

## 2020-05-13 MED ORDER — HYDROMORPHONE HCL 1 MG/ML IJ SOLN
0.5000 mg | INTRAMUSCULAR | Status: DC | PRN
  Administered 2020-05-13 – 2020-05-16 (×14): 0.5 mg via INTRAVENOUS
  Filled 2020-05-13 (×14): qty 0.5

## 2020-05-13 NOTE — Consult Note (Signed)
@  LOGO@  Brandon Castillo, male    DOB: 1950-09-09, 69 y.o.   MRN: [id number]   Brief patient profile:  18 yowm remote smoker admitted with FTT with working dx of R hilar mass c/w lung primary and widely metastatic ca though no tissue dx to date.      History of Present Illness  05/11/2020  Pulmonary/ hosp cosultation  Chief Complaint  Patient presents with  . Abdominal Pain  Dyspnea:  Room to room  Cough: minimally congested nothing purulent Sleep: able to lie flat  SABA use: not using PTA  Past Medical History:  Diagnosis Date  . Multiple sclerosis ([place])     (Not in an outpatient encounter)    Objective:     BP (!) 143/66 (BP Location: Left Arm)   Pulse 88   Temp 97.8 F (36.6 C) (Oral)   Resp 20   Ht 5\' 9"  (1.753 m)   Wt 68 kg   SpO2 95%   BMI 22.15 kg/m   SpO2: 95 %   Pt alert, approp nad @ 30 degrees hob  No jvd Oropharynx clear,  mucosa nl Neck supple Lungs with a few scattered exp > insp rhonchi bilaterally R >L  RRR no s3 or or sign murmur Abd soft nl  excursion  Extr warm with no edema or clubbing noted Neuro  Sensorium intact   no apparent motor deficits     I personally reviewed images and agree with radiology impression as follows:   Chest CT 05/11/20 s contrast 1. 3.2 cm spiculated right upper lobe lung mass invading the right hilum and right mediastinum consistent with primary lung neoplasm. 2. Several scattered subpleural pulmonary nodules are indeterminate. Metastatic disease is certainly a consideration. 3. Advanced emphysematous changes and pulmonary scarring. 4. Bilateral adrenal gland nodules and upper pole right renal lesion as seen on today's abdominal/pelvic CT scan. 5. No findings for osseous metastatic disease. 6. Emphysema and aortic atherosclerosis.              Assessment    1)  Probable R hilar bronchogenic ca with mediastinal spread and  ? Liver / adrenal mets  >>> ideally needs pet then easiest bx per IR  - if  they are willing to do bx as inpt based on MRI then this is reasonable  >>> otherwise d/c and will need PET then maybe fob /ebus can be arranged thru our office if another site not easier to reach based on IR review of PET   Discussed in detail all the  indications, usual  risks and alternatives  relative to the benefits with patient and sister in law who agree  to proceed with w/u as outlined.   They have my contact info if needed      Christinia Gully, MD Pulmonary and [place] [phone]   After 7:00 pm call Elink  [phone]

## 2020-05-13 NOTE — Progress Notes (Addendum)
PROGRESS NOTE   Brandon Castillo  QQI:[id number] DOB: Jan 23, 1951 DOA: 05/11/2020 PCP: Patient, No Pcp Per   Chief Complaint  Patient presents with  . Abdominal Pain    Brief Admission History:  69 y.o. 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  He has not seen a regular physician since he was a child.  He says that he has been hospitalized on rare occasions many years ago but not seeing any regular providers.  He reports that he is a former heavy smoker.  He smoked 1 pack/day for about 30 years.  He says that he quit smoking several years ago.  Patient says that he has never had a colon cancer screening and no colonoscopy or EGD.  He says that he lives with his brother who serves as his primary caretaker.  He says that for the past month he has been experiencing decreasing appetite, weight loss, generalized malaise, generalized abdominal pain, fatigue and pain in the left groin and lower abdomen and difficulty urinating.  He also reports that he has been dealing with a growth on his scrotum that affects him when he tries to sit down.  He has been having occasional fever and chills.  He presented to the emergency department with his brother today because they have been concerned about his abnormal weight loss, abdominal pain generalized weakness and frailty.  CT abdomen was done with findings of bilateral renal masses, liver nodule, bilateral adrenal nodules and a right upper lobe density.  CT chest without contrast with findings of a right upper lobe mass which is suspicious for primary lung neoplasm with metastases seen and noted.  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  IR evaluation requested for possible tissue biopsy of liver lesion.  Due to receiving contrast earlier today.  MRI abdomen will have to be done Wednesday morning per MRI team. Will arrange for MRI abdomen on 11/3.  IR will evaluate and determine if he can have biopsy done after reviewing MRI abdomen. Dr. Raliegh Ip requested pulmonology consult for possible bronchoscopy.  I have discussed with Dr. Melvyn Novas and requested a pulmonary consultation.  2. Abnormal weight loss - secondary to metastatic cancer - consult to dietitian for recommendations.  3. Elevated LFTs - secondary to liver metastases - LFTs are trending down.  Follow.  4. COPD/Emphysema - former heavy smoker - bronchodilators ordered as needed.  5. Difficulty urinating - monitor urine output, cath as needed.  6. Leukocytosis - likely reactive, RESOLVED.   7. Generalized anxiety - lorazepam ordered as needed.  8. Generalized abdominal pain and cancer pain - Oral and IV pain medications ordered as needed. Increase dilaudid to 0.5 mg every 3 hours prn.  9. Sinus tachycardia - Resolved now.  Secondary to metastatic cancer - treating supportively.  Venous dopplers of legs negative for DVT.  10. Scrotal sebaceous cyst - plan to refer to general surgery when he has been stabilized for removal of the cyst as it causes discomfort when sitting down.  11. Nausea and vomiting -IV nausea medication ordered as needed.  12. Multiple Sclerosis - Pt reports that he is not followed for this and has not seen a provider in many years.  32. Blindness - Brother plans  to stay with patient as much as possible to assist patient with ADLs.  14. 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 15. Anemia in neoplastic disease - Hg down with hemodilution, Holding stable around 12.     DVT prophylaxis: SQ heparin   Code Status: Full   Family Communication: brother at bedside   Disposition Plan: TBD   Consults called: oncology Delton Coombes)  Admission status: INP   Status is: Inpatient  Remains inpatient appropriate because:IV treatments appropriate due to intensity of illness or inability to take PO and Inpatient level of care appropriate due to severity of illness  Dispo: The patient is from: Home              Anticipated d/c is to: Home              Anticipated d/c date is: 3 days              Patient currently is not medically stable to d/c.  Consultants:   Medical oncology  Interventional radiology   Procedures:     Antimicrobials:       Subjective: Pt says he slept better but cancer pain has been difficult to control.  He is agreeable to MRI abdomen and biopsy.  Objective: Vitals:   05/11/20 2346 05/12/20 0418 05/12/20 1508 05/13/20 0557  BP: 132/68 133/76 130/72 118/79  Pulse: 79 77 78 72  Resp: 18 16 15 16   Temp: 98.3 F (36.8 C) 98 F (36.7 C) 98 F (36.7 C) (!) 97.3 F (36.3 C)  TempSrc: Oral Oral Oral Oral  SpO2: 96% 99% 99% 97%  Weight:      Height:        Intake/Output Summary (Last 24 hours) at 05/13/2020 1258 Last data filed at 05/12/2020 2100 Gross per 24 hour  Intake --  Output 500 ml  Net -500 ml   Filed Weights   05/11/20 1030  Weight: 68 kg    Examination:  General exam: Blind. Cachectic frail appearing male with dark glasses.  Appears calm and comfortable  Respiratory system: Clear to auscultation. Respiratory effort normal. Cardiovascular system: normal S1 & S2 heard. No JVD, murmurs, rubs, gallops or clicks. No pedal  edema. Gastrointestinal system: Abdomen is mildly distended with suprapubic tenderness. No organomegaly or masses felt. Normal bowel sounds heard. Central nervous system: Alert and oriented. No focal neurological deficits. Extremities: Symmetric 5 x 5 power. Skin: No rashes, lesions or ulcers  Psychiatry: Judgement and insight appear normal. Mood & affect appropriate.   Data Reviewed: I have personally reviewed following labs and imaging studies  CBC: Recent Labs  Lab 05/11/20 1047 05/12/20 0428 05/13/20 0607  WBC 13.4* 10.2 9.7  NEUTROABS 10.8* 7.5 6.9  HGB 13.0 11.8* 12.0*  HCT 40.8 37.6* 38.0*  MCV 102.8* 102.2* 102.4*  PLT 349 344 517    Basic Metabolic Panel: Recent Labs  Lab  05/11/20 1047 05/12/20 0428 05/13/20 0607  NA 137 138 136  K 4.3 4.0 3.7  CL 99 103 102  CO2 26 23 24   GLUCOSE 109* 93 115*  BUN 18 13 13   CREATININE 0.66 0.53* 0.55*  CALCIUM 9.6 9.3 9.2  MG  --  2.0 1.9    GFR: Estimated Creatinine Clearance: 85 mL/min (A) (by C-G formula based on SCr of 0.55 mg/dL (L)).  Liver Function Tests: Recent Labs  Lab 05/11/20 1047 05/12/20 0428 05/13/20 0607  AST 59* 51* 24  ALT 51* 55* 31  ALKPHOS 179* 153* 130*  BILITOT 0.5 0.9 0.7  PROT 7.3 6.5 6.4*  ALBUMIN 3.6 3.1* 3.0*    CBG: No results for input(s): GLUCAP in the last 168 hours.  Recent Results (from the past 240 hour(s))  Respiratory Panel by RT PCR (Flu A&B, Covid) - Nasopharyngeal Swab     Status: None   Collection Time: 05/11/20  1:21 PM   Specimen: Nasopharyngeal Swab  Result Value Ref Range Status   SARS Coronavirus 2 by RT PCR NEGATIVE NEGATIVE Final    Comment: (NOTE) SARS-CoV-2 target nucleic acids are NOT DETECTED.  The SARS-CoV-2 RNA is generally detectable in upper respiratoy specimens during the acute phase of infection. The lowest concentration of SARS-CoV-2 viral copies this assay can detect is 131 copies/mL. A negative result does not preclude SARS-Cov-2 infection  and should not be used as the sole basis for treatment or other patient management decisions. A negative result may occur with  improper specimen collection/handling, submission of specimen other than nasopharyngeal swab, presence of viral mutation(s) within the areas targeted by this assay, and inadequate number of viral copies (<131 copies/mL). A negative result must be combined with clinical observations, patient history, and epidemiological information. The expected result is Negative.  Fact Sheet for Patients:  PinkCheek.be  Fact Sheet for Healthcare Providers:  GravelBags.it  This test is no t yet approved or cleared by the [place] FDA and  has been authorized for detection and/or diagnosis of SARS-CoV-2 by FDA under an Emergency Use Authorization (EUA). This EUA will remain  in effect (meaning this test can be used) for the duration of the COVID-19 declaration under Section 564(b)(1) of the Act, 21 U.S.C. section 360bbb-3(b)(1), unless the authorization is terminated or revoked sooner.     Influenza A by PCR NEGATIVE NEGATIVE Final   Influenza B by PCR NEGATIVE NEGATIVE Final    Comment: (NOTE) The Xpert Xpress SARS-CoV-2/FLU/RSV assay is intended as an aid in  the diagnosis of influenza from Nasopharyngeal swab specimens and  should not be used as a sole basis for treatment. Nasal washings and  aspirates are unacceptable for Xpert Xpress SARS-CoV-2/FLU/RSV  testing.  Fact Sheet for Patients: PinkCheek.be  Fact Sheet for Healthcare Providers: GravelBags.it  This test is not yet approved or cleared by the [place] FDA and  has been authorized for detection and/or diagnosis of SARS-CoV-2 by  FDA under an Emergency Use Authorization (EUA). This EUA will remain  in effect (meaning this test can be used) for the duration of the  Covid-19 declaration  under Section 564(b)(1) of the Act, 21  U.S.C. section 360bbb-3(b)(1), unless the authorization is  terminated or revoked. Performed at [facility], [address], [place], [place] [address]      Radiology Studies: DG Chest 2 View  Result Date: 05/11/2020 CLINICAL DATA:  Chest pain. EXAM: CHEST - 2 VIEW COMPARISON:  None. FINDINGS: The heart is normal in  size. Mild tortuosity and calcification of the thoracic aorta. Slight ill-defined density in the right suprahilar/right upper lobe region. This is indeterminate finding but chest CT suggested to further evaluate. Lungs demonstrate hyperinflation and emphysematous changes. No pleural effusions. IMPRESSION: 1. Ill-defined right suprahilar/right upper lobe density. Chest CT suggested for further evaluate. 2. Underlying emphysematous changes. Electronically Signed   By: Marijo Sanes M.D.   On: 05/11/2020 13:26   CT Chest Wo Contrast  Result Date: 05/11/2020 CLINICAL DATA:  Abnormal chest x-ray. EXAM: CT CHEST WITHOUT CONTRAST TECHNIQUE: Multidetector CT imaging of the chest was performed following the standard protocol without IV contrast. COMPARISON:  Chest x-ray, same date. FINDINGS: Cardiovascular: The heart is normal in size. No pericardial effusion. Mild tortuosity and ectasia of the thoracic aorta with scattered atherosclerotic calcifications. Scattered coronary artery calcifications. Mediastinum/Nodes: Right upper lobe lung mass is invading the right hilum and right mediastinum. I do not see any discrete measurable mediastinal lymph nodes. The esophagus is grossly normal. Lungs/Pleura: 3.2 cm spiculated right upper lobe lung mass medially invading the right hilum and the right mediastinum. Findings consistent with primary lung neoplasm. Advanced emphysematous changes and areas of pulmonary scarring. There are several scattered subpleural pulmonary nodules noted which are indeterminate. None of these measures more than 5 mm. Metastatic disease is  certainly a consideration. No acute pulmonary findings.  No pleural effusions. Upper Abdomen: Bilateral adrenal gland nodules and upper pole right renal lesion as seen on today's abdominal/pelvic CT scan. Musculoskeletal: No worrisome bone lesions to suggest metastatic bone disease. IMPRESSION: 1. 3.2 cm spiculated right upper lobe lung mass invading the right hilum and right mediastinum consistent with primary lung neoplasm. 2. Several scattered subpleural pulmonary nodules are indeterminate. Metastatic disease is certainly a consideration. 3. Advanced emphysematous changes and pulmonary scarring. 4. Bilateral adrenal gland nodules and upper pole right renal lesion as seen on today's abdominal/pelvic CT scan. 5. No findings for osseous metastatic disease. 6. Emphysema and aortic atherosclerosis. Aortic Atherosclerosis (ICD10-I70.0) and Emphysema (ICD10-J43.9). Electronically Signed   By: Marijo Sanes M.D.   On: 05/11/2020 14:26   MR BRAIN W WO CONTRAST  Result Date: 05/12/2020 CLINICAL DATA:  Lung mass EXAM: MRI HEAD WITHOUT AND WITH CONTRAST TECHNIQUE: Multiplanar, multiecho pulse sequences of the brain and surrounding structures were obtained without and with intravenous contrast. CONTRAST:  18mL GADAVIST GADOBUTROL 1 MMOL/ML IV SOLN COMPARISON:  None. FINDINGS: Brain: There is no acute infarction or intracranial hemorrhage. There is no intracranial mass, mass effect, or edema. There is no hydrocephalus or extra-axial fluid collection. Prominence of the ventricles and sulci reflects minor generalized parenchymal volume loss. There is an incidental small right frontal developmental venous anomaly. 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 Patchy T2 hyperintensity in the supratentorial white matter is nonspecific but may reflect minor chronic microvascular ischemic changes. Vascular: Major vessel flow voids at the skull base are preserved. Skull and upper cervical  spine: Normal marrow signal is preserved. Sinuses/Orbits: Paranasal sinuses are aerated. Orbits are unremarkable. Other: Sella is unremarkable.  Mastoid air cells are clear. IMPRESSION: No evidence of intracranial metastatic disease. Chronic/nonemergent findings detailed above. Electronically Signed   By: Macy Mis M.D.   On: 05/12/2020 10:19   US Venous Img Lower Bilateral (DVT)  Result Date: 05/12/2020 CLINICAL DATA:  Bilateral lower extremity pain.  Evaluate for DVT. EXAM: BILATERAL LOWER EXTREMITY VENOUS DOPPLER ULTRASOUND TECHNIQUE: Gray-scale sonography with graded compression, as well as color Doppler and duplex ultrasound were performed to evaluate  the lower extremity deep venous systems from the level of the common femoral vein and including the common femoral, femoral, profunda femoral, popliteal and calf veins including the posterior tibial, peroneal and gastrocnemius veins when visible. The superficial great saphenous vein was also interrogated. Spectral Doppler was utilized to evaluate flow at rest and with distal augmentation maneuvers in the common femoral, femoral and popliteal veins. COMPARISON:  None. FINDINGS: RIGHT LOWER EXTREMITY Common Femoral Vein: No evidence of thrombus. Normal compressibility, respiratory phasicity and response to augmentation. Saphenofemoral Junction: No evidence of thrombus. Normal compressibility and flow on color Doppler imaging. Profunda Femoral Vein: No evidence of thrombus. Normal compressibility and flow on color Doppler imaging. Femoral Vein: No evidence of thrombus. Normal compressibility, respiratory phasicity and response to augmentation. Popliteal Vein: No evidence of thrombus. Normal compressibility, respiratory phasicity and response to augmentation. Calf Veins: No evidence of thrombus. Normal compressibility and flow on color Doppler imaging. Superficial Great Saphenous Vein: No evidence of thrombus. Normal compressibility. Venous Reflux:  None.  Other Findings:  None. LEFT LOWER EXTREMITY Common Femoral Vein: No evidence of thrombus. Normal compressibility, respiratory phasicity and response to augmentation. Saphenofemoral Junction: No evidence of thrombus. Normal compressibility and flow on color Doppler imaging. Profunda Femoral Vein: No evidence of thrombus. Normal compressibility and flow on color Doppler imaging. Femoral Vein: No evidence of thrombus. Normal compressibility, respiratory phasicity and response to augmentation. Popliteal Vein: No evidence of thrombus. Normal compressibility, respiratory phasicity and response to augmentation. Calf Veins: No evidence of thrombus. Normal compressibility and flow on color Doppler imaging. Superficial Great Saphenous Vein: No evidence of thrombus. Normal compressibility. Venous Reflux:  None. Other Findings:  None. IMPRESSION: No evidence of DVT within either lower extremity. Electronically Signed   By: Sandi Mariscal M.D.   On: 05/12/2020 17:38     Scheduled Meds: . feeding supplement  237 mL Oral TID BM  . heparin  5,000 Units Subcutaneous Q8H  . multivitamin-lutein  1 capsule Oral Daily  . pantoprazole  40 mg Oral Daily  . polyethylene glycol  17 g Oral Daily   Continuous Infusions: . sodium chloride 50 mL/hr at 05/13/20 0304    LOS: 2 days   Time spent: 70 mins  Evadne Ose Wynetta Emery, MD How to contact the [facility] Attending or Consulting provider [place] or covering provider during after hours [place], for this patient?  1. Check the care team in [facility] and look for a) attending/consulting [facility] provider listed and b) the [facility] team listed 2. Log into [email or web address] and use [place]'s universal password to access. If you do not have the password, please contact the hospital operator. 3. Locate the [facility] provider you are looking for under [facility] and page to a number that you can be directly reached. 4. If you still have difficulty reaching the provider, please page the [facility] (Director on  Call) for the Hospitalists listed on amion for assistance.  05/13/2020, 12:58 PM

## 2020-05-14 LAB — CBC WITH DIFFERENTIAL/PLATELET
Abs Immature Granulocytes: 0.04 10*3/uL (ref 0.00–0.07)
Basophils Absolute: 0 10*3/uL (ref 0.0–0.1)
Basophils Relative: 0 %
Eosinophils Absolute: 0.2 10*3/uL (ref 0.0–0.5)
Eosinophils Relative: 1 %
HCT: 42.4 % (ref 39.0–52.0)
Hemoglobin: 13.5 g/dL (ref 13.0–17.0)
Immature Granulocytes: 0 %
Lymphocytes Relative: 15 %
Lymphs Abs: 1.7 10*3/uL (ref 0.7–4.0)
MCH: 32.6 pg (ref 26.0–34.0)
MCHC: 31.8 g/dL (ref 30.0–36.0)
MCV: 102.4 fL — ABNORMAL HIGH (ref 80.0–100.0)
Monocytes Absolute: 1.1 10*3/uL — ABNORMAL HIGH (ref 0.1–1.0)
Monocytes Relative: 10 %
Neutro Abs: 8.4 10*3/uL — ABNORMAL HIGH (ref 1.7–7.7)
Neutrophils Relative %: 74 %
Platelets: 411 10*3/uL — ABNORMAL HIGH (ref 150–400)
RBC: 4.14 MIL/uL — ABNORMAL LOW (ref 4.22–5.81)
RDW: 12.1 % (ref 11.5–15.5)
WBC: 11.5 10*3/uL — ABNORMAL HIGH (ref 4.0–10.5)
nRBC: 0 % (ref 0.0–0.2)

## 2020-05-14 LAB — COMPREHENSIVE METABOLIC PANEL
ALT: 35 U/L (ref 0–44)
AST: 27 U/L (ref 15–41)
Albumin: 3.3 g/dL — ABNORMAL LOW (ref 3.5–5.0)
Alkaline Phosphatase: 143 U/L — ABNORMAL HIGH (ref 38–126)
Anion gap: 11 (ref 5–15)
BUN: 10 mg/dL (ref 8–23)
CO2: 26 mmol/L (ref 22–32)
Calcium: 9.8 mg/dL (ref 8.9–10.3)
Chloride: 100 mmol/L (ref 98–111)
Creatinine, Ser: 0.66 mg/dL (ref 0.61–1.24)
GFR, Estimated: >60 mL/min (ref >60)
Glucose, Bld: 122 mg/dL — ABNORMAL HIGH (ref 70–99)
Potassium: 3.8 mmol/L (ref 3.5–5.1)
Sodium: 137 mmol/L (ref 135–145)
Total Bilirubin: 0.6 mg/dL (ref 0.3–1.2)
Total Protein: 7.3 g/dL (ref 6.5–8.1)

## 2020-05-14 LAB — MAGNESIUM: Magnesium: 1.9 mg/dL (ref 1.7–2.4)

## 2020-05-14 MED ORDER — HEPARIN SODIUM (PORCINE) 5000 UNIT/ML IJ SOLN
5000.0000 [IU] | Freq: Three times a day (TID) | INTRAMUSCULAR | Status: DC
  Administered 2020-05-16: 5000 [IU] via SUBCUTANEOUS
  Filled 2020-05-14: qty 1

## 2020-05-14 NOTE — Plan of Care (Signed)

## 2020-05-14 NOTE — Consult Note (Addendum)
Chief Complaint: Patient was seen in consultation today for liver lesion biopsy Chief Complaint  Patient presents with  . Abdominal Pain   at the request of Dr Delton Coombes  Referring Physician(s): Dr Maurene Capes  Supervising Physician: Daryll Brod  Patient Status: [facility] IP  History of Present Illness: Brandon Castillo is a 69 y.o. male   Hx MS (blind) Never sees an MD--- not since child Quit smoking yrs ago-- former heavy smoker  For 1 mo has noted loss of appetite and wt loss RUQ abd pain Difficult urination Occasional fever/chills Scrotal mass -- enlarging Presents to [facility] ED 10/31  work up includes imaging:  CT IMPRESSION: 1. 3.2 cm spiculated right upper lobe lung mass invading the right hilum and right mediastinum consistent with primary lung neoplasm. 2. Several scattered subpleural pulmonary nodules are indeterminate. Metastatic disease is certainly a consideration. 3. Advanced emphysematous changes and pulmonary scarring. 4. Bilateral adrenal gland nodules and upper pole right renal lesion as seen on today's abdominal/pelvic CT scan. 5. No findings for osseous metastatic disease. 6. Emphysema and aortic atherosclerosis.  MR 11/3:   IMPRESSION: 1. Again seen are multiple, bilateral peripherally enhancing kidney lesions. In the setting of a suspicious lung mass, the multiplicity, signal and enhancement characteristics of these lesions favor metastatic disease. Differential considerations include multiple cystic renal cell carcinomas versus multiple liver metastases. 2. Liver lesion adjacent to the gallbladder fossa has signal and enhancement characteristics favoring a metastatic lesion.   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 Liver / adrenal mets  >>> ideally needs pet then easiest bx per IR  - if they are willing to do bx as inpt based on MRI then this is reasonable >>>  otherwise d/c and will need PET then maybe fob /ebus can be arranged thru our office if another site not easier to reach based on IR review of PET   Dr Delton Coombes requesting tissue diagnosis Dr Annamaria Boots has reviewed imaging and approves procedure  Past Medical History:  Diagnosis Date  . Multiple sclerosis ([place])     History reviewed. No pertinent surgical history.  Allergies: Demerol [meperidine hcl]  Medications: Prior to Admission medications   Medication Sig Start Date End Date Taking? Authorizing Provider  HYDROcodone-acetaminophen (NORCO) 10-325 MG tablet Take 2 tablets by mouth every 6 (six) hours as needed for moderate pain.   Yes [provider]  LORazepam (ATIVAN) 1 MG tablet Take 1 mg by mouth every 8 (eight) hours as needed for anxiety.   Yes [provider]     History reviewed. No pertinent family history.  Social History   Socioeconomic History  . Marital status: Single    Spouse name: Not on file  . Number of children: Not on file  . Years of education: Not on file  . Highest education level: Not on file  Occupational History  . Not on file  Tobacco Use  . Smoking status: Never Smoker  . Smokeless tobacco: Never Used  Substance and Sexual Activity  . Alcohol use: Not Currently  . Drug use: Not Currently  . Sexual activity: Not on file  Other Topics Concern  . Not on file  Social History Narrative  . Not on file   Social Determinants of Health   Financial Resource Strain:   . Difficulty of Paying Living Expenses: Not on file  Food Insecurity:   . Worried About Estate manager/land agent  of Food in the Last Year: Not on file  . Ran Out of Food in the Last Year: Not on file  Transportation Needs:   . Lack of Transportation (Medical): Not on file  . Lack of Transportation (Non-Medical): Not on file  Physical Activity:   . Days of Exercise per Week: Not on file  . Minutes of Exercise per Session: Not on file  Stress:   . Feeling of Stress : Not on  file  Social Connections:   . Frequency of Communication with Friends and Family: Not on file  . Frequency of Social Gatherings with Friends and Family: Not on file  . Attends Religious Services: Not on file  . Active Member of Clubs or Organizations: Not on file  . Attends Archivist Meetings: Not on file  . Marital Status: Not on file    Review of Systems: A 12 point ROS discussed and pertinent positives are indicated in the HPI above.  All other systems are negative.  Review of Systems  Constitutional: Positive for activity change, fatigue, fever and unexpected weight change.  Respiratory: Negative for cough and shortness of breath.   Cardiovascular: Negative for chest pain.  Gastrointestinal: Positive for abdominal pain and nausea.  Genitourinary: Positive for difficulty urinating.  Musculoskeletal: Positive for back pain.  Neurological: Positive for weakness.  Psychiatric/Behavioral: Negative for behavioral problems and confusion.    Vital Signs: BP 137/68 (BP Location: Left Arm)   Pulse 70   Temp 97.6 F (36.4 C) (Oral)   Resp 20   Ht 5\' 9"  (1.753 m)   Wt 150 lb (68 kg)   SpO2 96%   BMI 22.15 kg/m   Physical Exam Vitals reviewed.  Cardiovascular:     Rate and Rhythm: Normal rate and regular rhythm.  Pulmonary:     Breath sounds: Normal breath sounds.  Abdominal:     Palpations: Abdomen is soft.     Tenderness: There is abdominal tenderness.  Skin:    General: Skin is warm.  Neurological:     Mental Status: He is alert and oriented to person, place, and time.  Psychiatric:        Behavior: Behavior normal.     Imaging: DG Chest 2 View  Result Date: 05/11/2020 CLINICAL DATA:  Chest pain. EXAM: CHEST - 2 VIEW COMPARISON:  None. FINDINGS: The heart is normal in size. Mild tortuosity and calcification of the thoracic aorta. Slight ill-defined density in the right suprahilar/right upper lobe region. This is indeterminate finding but chest CT  suggested to further evaluate. Lungs demonstrate hyperinflation and emphysematous changes. No pleural effusions. IMPRESSION: 1. Ill-defined right suprahilar/right upper lobe density. Chest CT suggested for further evaluate. 2. Underlying emphysematous changes. Electronically Signed   By: Marijo Sanes M.D.   On: 05/11/2020 13:26   CT Chest Wo Contrast  Result Date: 05/11/2020 CLINICAL DATA:  Abnormal chest x-ray. EXAM: CT CHEST WITHOUT CONTRAST TECHNIQUE: Multidetector CT imaging of the chest was performed following the standard protocol without IV contrast. COMPARISON:  Chest x-ray, same date. FINDINGS: Cardiovascular: The heart is normal in size. No pericardial effusion. Mild tortuosity and ectasia of the thoracic aorta with scattered atherosclerotic calcifications. Scattered coronary artery calcifications. Mediastinum/Nodes: Right upper lobe lung mass is invading the right hilum and right mediastinum. I do not see any discrete measurable mediastinal lymph nodes. The esophagus is grossly normal. Lungs/Pleura: 3.2 cm spiculated right upper lobe lung mass medially invading the right hilum and the right mediastinum. Findings consistent with  primary lung neoplasm. Advanced emphysematous changes and areas of pulmonary scarring. There are several scattered subpleural pulmonary nodules noted which are indeterminate. None of these measures more than 5 mm. Metastatic disease is certainly a consideration. No acute pulmonary findings.  No pleural effusions. Upper Abdomen: Bilateral adrenal gland nodules and upper pole right renal lesion as seen on today's abdominal/pelvic CT scan. Musculoskeletal: No worrisome bone lesions to suggest metastatic bone disease. IMPRESSION: 1. 3.2 cm spiculated right upper lobe lung mass invading the right hilum and right mediastinum consistent with primary lung neoplasm. 2. Several scattered subpleural pulmonary nodules are indeterminate. Metastatic disease is certainly a consideration. 3.  Advanced emphysematous changes and pulmonary scarring. 4. Bilateral adrenal gland nodules and upper pole right renal lesion as seen on today's abdominal/pelvic CT scan. 5. No findings for osseous metastatic disease. 6. Emphysema and aortic atherosclerosis. Aortic Atherosclerosis (ICD10-I70.0) and Emphysema (ICD10-J43.9). Electronically Signed   By: Marijo Sanes M.D.   On: 05/11/2020 14:26   MR BRAIN W WO CONTRAST  Result Date: 05/12/2020 CLINICAL DATA:  Lung mass EXAM: MRI HEAD WITHOUT AND WITH CONTRAST TECHNIQUE: Multiplanar, multiecho pulse sequences of the brain and surrounding structures were obtained without and with intravenous contrast. CONTRAST:  24mL GADAVIST GADOBUTROL 1 MMOL/ML IV SOLN COMPARISON:  None. FINDINGS: Brain: There is no acute infarction or intracranial hemorrhage. There is no intracranial mass, mass effect, or edema. There is no hydrocephalus or extra-axial fluid collection. Prominence of the ventricles and sulci reflects minor generalized parenchymal volume loss. There is an incidental small right frontal developmental venous anomaly. 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 Patchy T2 hyperintensity in the supratentorial white matter is nonspecific but may reflect minor chronic microvascular ischemic changes. Vascular: Major vessel flow voids at the skull base are preserved. Skull and upper cervical spine: Normal marrow signal is preserved. Sinuses/Orbits: Paranasal sinuses are aerated. Orbits are unremarkable. Other: Sella is unremarkable.  Mastoid air cells are clear. IMPRESSION: No evidence of intracranial metastatic disease. Chronic/nonemergent findings detailed above. Electronically Signed   By: Macy Mis M.D.   On: 05/12/2020 10:19   MR ABDOMEN W WO CONTRAST  Result Date: 05/14/2020 CLINICAL DATA:  Evaluate for liver metastases. EXAM: MRI ABDOMEN WITHOUT AND WITH CONTRAST TECHNIQUE: Multiplanar multisequence MR imaging of the  abdomen was performed both before and after the administration of intravenous contrast. CONTRAST:  41mL GADAVIST GADOBUTROL 1 MMOL/ML IV SOLN COMPARISON:  CT AP 05/11/2020 FINDINGS: Lower chest: No acute findings. Hepatobiliary: Within segment 5 there is a T1 hypointense and mildly T2 hyperintense lesion adjacent to the gallbladder fossa which shows peripheral enhancement consistent with a metastatic lesion. This measures approximately 2.1 x 1.7 cm, image 49/16. Within the subcapsular aspect of the posterior right hepatic lobe there is a 1.3 x 0.7 cm T2 hyperintense and T1 hypointense structure without corresponding enhancement which is favored to represent a benign abnormality. The gallbladder is unremarkable. No bile duct dilatation identified. Pancreas: No mass, inflammatory changes, or other parenchymal abnormality identified. Spleen:  Within normal limits in size and appearance. Adrenals/Urinary Tract:  Normal appearance of the adrenal glands. Again seen are multiple peripherally enhancing right kidney lesions. The dominant lesion arises from the inferior pole of the right kidney measuring 5.1 x 5.4 cm, image 65/6. This is T1 hypointense, T2 isointense with mild peripheral enhancement. Similarly, arising from the medial cortex of the upper pole there is a irregular mass measuring 3.4 x 2.5 cm which is T2 isointense, T1 hypointense, with peripheral enhancement.  This lesion also contains a thin internal area of linear enhancement, image 48/16. Also arising from the upper pole of the right kidney is a small peripherally enhancing lesion which is T1 hypointense, teen 2 hyperintense and measures 1.9 x 1.5 cm, image 49/16. Small simple appearing cyst is identified involving the lateral cortex of the right kidney measuring 0.5 cm, image 18/4. 2 distinct lesions are noted arising from the upper pole of left kidney which have similar signal and enhancement characteristics to the suspicious right kidney lesions, image  34/18. The largest of these measures 1.5 cm. Stomach/Bowel: Visualized portions within the abdomen are unremarkable. Vascular/Lymphatic: No signs of renal vein involvement bilaterally. The IVC is patent without evidence for tumor thrombus. Aortic atherosclerosis. No aneurysm. No abdominal adenopathy. Other: No ascites or focal fluid collections identified within the imaged portions of the upper abdomen. Musculoskeletal: No suspicious bone lesions identified. IMPRESSION: 1. Again seen are multiple, bilateral peripherally enhancing kidney lesions. In the setting of a suspicious lung mass, the multiplicity, signal and enhancement characteristics of these lesions favor metastatic disease. Differential considerations include multiple cystic renal cell carcinomas versus multiple liver metastases. 2. Liver lesion adjacent to the gallbladder fossa has signal and enhancement characteristics favoring a metastatic lesion. Electronically Signed   By: Kerby Moors M.D.   On: 05/14/2020 05:11   CT Abdomen Pelvis W Contrast  Addendum Date: 05/11/2020   ADDENDUM REPORT: 05/11/2020 13:43 ADDENDUM: Of note, an alternative differential consideration for the bilateral renal masses is renal abscesses. The hypoechoic mass in the liver could represent a benign hemangioma, and MR abdomen according could be considered for further evaluation as part of the patient's workup. These results were called by telephone at the time of interpretation on 05/11/2020 at 1:42 pm to provider [facility] , who verbally acknowledged these results. Electronically Signed   By: Zerita Boers M.D.   On: 05/11/2020 13:43   Result Date: 05/11/2020 CLINICAL DATA:  Lower abdominal pain and generalized weakness for 2 weeks. EXAM: CT ABDOMEN AND PELVIS WITH CONTRAST TECHNIQUE: Multidetector CT imaging of the abdomen and pelvis was performed using the standard protocol following bolus administration of intravenous contrast. CONTRAST:  7mL OMNIPAQUE  IOHEXOL 300 MG/ML  SOLN COMPARISON:  Report from abdominal radiograph dated 03/05/2002. FINDINGS: Lower chest: No acute abnormality. Hepatobiliary: There is a 1.6 cm hypoattenuating mass in hepatic segment 5/4B near the gallbladder fossa. No gallstones, gallbladder wall thickening, or biliary dilatation. Pancreas: Unremarkable. No pancreatic ductal dilatation or surrounding inflammatory changes. Spleen: Normal in size without focal abnormality. Adrenals/Urinary Tract: Two left adrenal nodules measure approximately 0.8 and 0.9 cm (series 5, image 49 and image 46). The right adrenal gland appears normal. Multiple indistinct hypoechoic masses enlarged the right kidney with the largest measuring 6.2 cm in diameter. A mass is seen in the right renal pelvis, measuring 2.9 cm (series 7, image 16). At least two hypoechoic masses are seen in the left kidney, measuring up to 1.7 cm. A 7 mm mass in the lateral right kidney likely represents a benign cyst (series 2, image 33). There is no hydronephrosis. Bladder is unremarkable. Stomach/Bowel: Stomach is within normal limits. Appendix appears normal. There is colonic diverticulosis without evidence of diverticulitis. No evidence of bowel wall thickening, distention, or inflammatory changes. Vascular/Lymphatic: Aortic atherosclerosis. The inferior vena cava and both renal veins appear normal. No enlarged abdominal or pelvic lymph nodes. Reproductive: The prostate is mildly enlarged, measuring 5.1 cm in transverse dimension Other: No abdominal wall hernia or abnormality.  No abdominopelvic ascites. Musculoskeletal: Degenerative changes are seen in the spine. IMPRESSION: 1. Multiple hypoechoic masses in both kidneys as well as a mass in the right renal pelvis. Differential considerations include renal cell carcinoma, transitional cell carcinoma, renal lymphoma, and metastatic disease. Indeterminate hypoattenuating mass in the liver and two left adrenal nodules may represent  metastatic disease. CT abdomen pelvis according to a renal protocol could be considered for further evaluation. Aortic Atherosclerosis (ICD10-I70.0). Electronically Signed: By: Zerita Boers M.D. On: 05/11/2020 12:28   US Venous Img Lower Bilateral (DVT)  Result Date: 05/12/2020 CLINICAL DATA:  Bilateral lower extremity pain.  Evaluate for DVT. EXAM: BILATERAL LOWER EXTREMITY VENOUS DOPPLER ULTRASOUND TECHNIQUE: Gray-scale sonography with graded compression, as well as color Doppler and duplex ultrasound were performed to evaluate the lower extremity deep venous systems from the level of the common femoral vein and including the common femoral, femoral, profunda femoral, popliteal and calf veins including the posterior tibial, peroneal and gastrocnemius veins when visible. The superficial great saphenous vein was also interrogated. Spectral Doppler was utilized to evaluate flow at rest and with distal augmentation maneuvers in the common femoral, femoral and popliteal veins. COMPARISON:  None. FINDINGS: RIGHT LOWER EXTREMITY Common Femoral Vein: No evidence of thrombus. Normal compressibility, respiratory phasicity and response to augmentation. Saphenofemoral Junction: No evidence of thrombus. Normal compressibility and flow on color Doppler imaging. Profunda Femoral Vein: No evidence of thrombus. Normal compressibility and flow on color Doppler imaging. Femoral Vein: No evidence of thrombus. Normal compressibility, respiratory phasicity and response to augmentation. Popliteal Vein: No evidence of thrombus. Normal compressibility, respiratory phasicity and response to augmentation. Calf Veins: No evidence of thrombus. Normal compressibility and flow on color Doppler imaging. Superficial Great Saphenous Vein: No evidence of thrombus. Normal compressibility. Venous Reflux:  None. Other Findings:  None. LEFT LOWER EXTREMITY Common Femoral Vein: No evidence of thrombus. Normal compressibility, respiratory phasicity  and response to augmentation. Saphenofemoral Junction: No evidence of thrombus. Normal compressibility and flow on color Doppler imaging. Profunda Femoral Vein: No evidence of thrombus. Normal compressibility and flow on color Doppler imaging. Femoral Vein: No evidence of thrombus. Normal compressibility, respiratory phasicity and response to augmentation. Popliteal Vein: No evidence of thrombus. Normal compressibility, respiratory phasicity and response to augmentation. Calf Veins: No evidence of thrombus. Normal compressibility and flow on color Doppler imaging. Superficial Great Saphenous Vein: No evidence of thrombus. Normal compressibility. Venous Reflux:  None. Other Findings:  None. IMPRESSION: No evidence of DVT within either lower extremity. Electronically Signed   By: Sandi Mariscal M.D.   On: 05/12/2020 17:38    Labs:  CBC: Recent Labs    05/11/20 1047 05/12/20 0428 05/13/20 0607 05/14/20 0505  WBC 13.4* 10.2 9.7 11.5*  HGB 13.0 11.8* 12.0* 13.5  HCT 40.8 37.6* 38.0* 42.4  PLT 349 344 339 411*    COAGS: No results for input(s): INR, APTT in the last 8760 hours.  BMP: Recent Labs    05/11/20 1047 05/12/20 0428 05/13/20 0607 05/14/20 0505  NA 137 138 136 137  K 4.3 4.0 3.7 3.8  CL 99 103 102 100  CO2 26 23 24 26   GLUCOSE 109* 93 115* 122*  BUN 18 13 13 10   CALCIUM 9.6 9.3 9.2 9.8  CREATININE 0.66 0.53* 0.55* 0.66  GFRNONAA >60 >60 >60 >60    LIVER FUNCTION TESTS: Recent Labs    05/11/20 1047 05/12/20 0428 05/13/20 0607 05/14/20 0505  BILITOT 0.5 0.9 0.7 0.6  AST 59* 51* 24 27  ALT 51* 55* 31 35  ALKPHOS 179* 153* 130* 143*  PROT 7.3 6.5 6.4* 7.3  ALBUMIN 3.6 3.1* 3.0* 3.3*    TUMOR MARKERS: No results for input(s): AFPTM, CEA, CA199, CHROMGRNA in the last 8760 hours.  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  All of the questions were answered and there is agreement to proceed. Consent signed and in chart.  Thank you for this interesting consult.  I greatly enjoyed meeting Brandon Castillo and look forward to participating in their care.  A copy of this report was sent to the requesting provider on this date.  Electronically Signed: Lavonia Drafts, PA-C 05/14/2020, 11:04 AM   I spent a total of 40 Minutes    in face to face in clinical consultation, greater than 50% of which was counseling/coordinating care for liver lesion biopsy

## 2020-05-14 NOTE — Progress Notes (Signed)
PROGRESS NOTE   Brandon Castillo  VOH:[id number] DOB: 1951-06-17 DOA: 05/11/2020 PCP: Patient, No Pcp Per   Chief Complaint  Patient presents with  . Abdominal Pain    Brief Admission History:  69 y.o. 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  He has not seen a regular physician since he was a child.  He says that he has been hospitalized on rare occasions many years ago but not seeing any regular providers.  He reports that he is a former heavy smoker.  He smoked 1 pack/day for about 30 years.  He says that he quit smoking several years ago.  Patient says that he has never had a colon cancer screening and no colonoscopy or EGD.  He says that he lives with his brother who serves as his primary caretaker.  He says that for the past month he has been experiencing decreasing appetite, weight loss, generalized malaise, generalized abdominal pain, fatigue and pain in the left groin and lower abdomen and difficulty urinating.  He also reports that he has been dealing with a growth on his scrotum that affects him when he tries to sit down.  He has been having occasional fever and chills.  He presented to the emergency department with his brother today because they have been concerned about his abnormal weight loss, abdominal pain generalized weakness and frailty.  CT abdomen was done with findings of bilateral renal masses, liver nodule, bilateral adrenal nodules and a right upper lobe density.  CT chest without contrast with findings of a right upper lobe mass which is suspicious for primary lung neoplasm with metastases seen and noted.  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  IR evaluation requested for possible tissue biopsy of liver lesion.   MRI abdomen reviewed with radiology service -Dr Delton Coombes requesting tissue diagnosis Dr Annamaria Boots has reviewed imaging and approves procedure--plan is for image guided biopsy of liver presumed metastatic lesions on 05/15/2020 at [facility] - discussed with Dr. Melvyn Novas    2)Elevated LFTs - secondary to liver metastases -ALT and AST has normalized  3)COPD/Emphysema - former heavy smoker - bronchodilators ordered as needed.   4) social/ethics--- patient is legally blind, his brother who lives not too far away from him helps him from time to time -Patient is a full code, palliative care consult requested for goals of care and advanced directives  5)Generalized anxiety - lorazepam ordered as needed.   6)Generalized abdominal pain and cancer pain - Oral and IV pain medications ordered as needed.   7)Sinus tachycardia - Resolved now.  Secondary to metastatic cancer - treating supportively.  Venous dopplers of legs negative for DVT.    8)Scrotal sebaceous cyst -  not really bothersome at this time, expectant management   9)Multiple Sclerosis - Pt reports that he is not followed for this and has not seen a provider in many years.   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   DVT prophylaxis: SQ heparin   Code Status: Full   Family Communication: brother at bedside   Disposition Plan:  Possible discharge home with home health when with  brother Consults called: oncology Delton Coombes) Rosario Jacks and IR Admission status: INP   Status is: Inpatient  Remains inpatient appropriate because:IV treatments appropriate due to intensity of illness or inability to take PO and Inpatient level of care appropriate due to severity of illness  Dispo: The patient is from: Home              Anticipated d/c is to: Home              Anticipated d/c date is: 3 days              Patient currently is not medically stable to d/c. --Patient going to [facility] on 05/15/2020 for image guided biopsy of the liver for presumed lung cancer with mets to the liver  Consultants:   Medical oncology  Interventional radiology   Pulm  Procedures:     Antimicrobials:       Subjective: -Brother at bedside, questions answered, no further emesis, pain is better controlled ---Patient going to [facility] on 05/15/2020 for image guided biopsy of the liver for presumed lung cancer with mets to the liver   Objective: Vitals:   05/13/20 0557 05/13/20 1340 05/13/20 2116 05/14/20 1424  BP: 118/79 (!) 143/66 137/68 117/70  Pulse: 72 88 70 83  Resp: 16 20 20 18   Temp: (!) 97.3 F (36.3 C) 97.8 F (36.6 C) 97.6 F (36.4 C) 98.7 F (37.1 C)  TempSrc: Oral Oral Oral   SpO2: 97% 95% 96% 98%  Weight:      Height:        Intake/Output Summary (Last 24 hours) at 05/14/2020 1700 Last data filed at 05/14/2020 0900 Gross per 24 hour  Intake 120 ml  Output 850 ml  Net -730 ml   Filed Weights   05/11/20 1030  Weight: 68 kg    Examination:  General exam: Blind. Cachectic frail appearing male with dark glasses.  Appears calm and comfortable  Respiratory system: Clear to auscultation. Respiratory effort normal. Cardiovascular system: normal S1 & S2 heard. No JVD, murmurs, rubs, gallops or clicks. No pedal edema. Gastrointestinal system: Abdomen is mildly distended with suprapubic tenderness. . Normal bowel sounds heard. Central nervous system: Alert and  oriented. No new focal neurological deficits. Extremities: Symmetric 5 x 5 power. Skin: No rashes, lesions or ulcers  Psychiatry: Judgement and insight appear normal. Mood & affect appropriate.   Data Reviewed: I have personally reviewed following labs and imaging studies  CBC: Recent Labs  Lab 05/11/20 1047 05/12/20 0428 05/13/20 0607 05/14/20 0505  WBC 13.4* 10.2 9.7 11.5*  NEUTROABS 10.8* 7.5 6.9 8.4*  HGB 13.0 11.8* 12.0* 13.5  HCT 40.8 37.6* 38.0* 42.4  MCV 102.8* 102.2* 102.4* 102.4*  PLT 349 344 339 411*    Basic Metabolic Panel: Recent Labs  Lab 05/11/20 1047 05/12/20 0428 05/13/20 0607 05/14/20 0505  NA 137 138 136 137  K 4.3 4.0 3.7 3.8  CL 99 103 102 100  CO2 26 23 24 26   GLUCOSE  109* 93 115* 122*  BUN 18 13 13 10   CREATININE 0.66 0.53* 0.55* 0.66  CALCIUM 9.6 9.3 9.2 9.8  MG  --  2.0 1.9 1.9    GFR: Estimated Creatinine Clearance: 85 mL/min (by C-G formula based on SCr of 0.66 mg/dL).  Liver Function Tests: Recent Labs  Lab 05/11/20 1047 05/12/20 0428 05/13/20 0607 05/14/20 0505  AST 59* 51* 24 27  ALT 51* 55* 31 35  ALKPHOS 179* 153* 130* 143*  BILITOT 0.5 0.9 0.7 0.6  PROT 7.3 6.5 6.4* 7.3  ALBUMIN 3.6 3.1* 3.0* 3.3*    CBG: No results for input(s): GLUCAP in the last 168 hours.  Recent Results (from the past 240 hour(s))  Respiratory Panel by RT PCR (Flu A&B, Covid) - Nasopharyngeal Swab     Status: None   Collection Time: 05/11/20  1:21 PM   Specimen: Nasopharyngeal Swab  Result Value Ref Range Status   SARS Coronavirus 2 by RT PCR NEGATIVE NEGATIVE Final    Comment: (NOTE) SARS-CoV-2 target nucleic acids are NOT DETECTED.  The SARS-CoV-2 RNA is generally detectable in upper respiratoy specimens during the acute phase of infection. The lowest concentration of SARS-CoV-2 viral copies this assay can detect is 131 copies/mL. A negative result does not preclude SARS-Cov-2 infection and should not be used as the sole basis for  treatment or other patient management decisions. A negative result may occur with  improper specimen collection/handling, submission of specimen other than nasopharyngeal swab, presence of viral mutation(s) within the areas targeted by this assay, and inadequate number of viral copies (<131 copies/mL). A negative result must be combined with clinical observations, patient history, and epidemiological information. The expected result is Negative.  Fact Sheet for Patients:  PinkCheek.be  Fact Sheet for Healthcare Providers:  GravelBags.it  This test is no t yet approved or cleared by the [place] FDA and  has been authorized for detection and/or diagnosis of SARS-CoV-2 by FDA under an Emergency Use Authorization (EUA). This EUA will remain  in effect (meaning this test can be used) for the duration of the COVID-19 declaration under Section 564(b)(1) of the Act, 21 U.S.C. section 360bbb-3(b)(1), unless the authorization is terminated or revoked sooner.     Influenza A by PCR NEGATIVE NEGATIVE Final   Influenza B by PCR NEGATIVE NEGATIVE Final    Comment: (NOTE) The Xpert Xpress SARS-CoV-2/FLU/RSV assay is intended as an aid in  the diagnosis of influenza from Nasopharyngeal swab specimens and  should not be used as a sole basis for treatment. Nasal washings and  aspirates are unacceptable for Xpert Xpress SARS-CoV-2/FLU/RSV  testing.  Fact Sheet for Patients: PinkCheek.be  Fact Sheet for Healthcare Providers: GravelBags.it  This test is not yet approved or cleared by the [place] FDA and  has been authorized for detection and/or diagnosis of SARS-CoV-2 by  FDA under an Emergency Use Authorization (EUA). This EUA will remain  in effect (meaning this test can be used) for the duration of the  Covid-19 declaration under Section 564(b)(1) of the Act, 21   U.S.C. section 360bbb-3(b)(1), unless the authorization is  terminated or revoked. Performed at [facility], [address] [place], [place] [address]      Radiology Studies: MR ABDOMEN W WO CONTRAST  Result Date: 05/14/2020 CLINICAL DATA:  Evaluate for liver metastases. EXAM: MRI ABDOMEN WITHOUT AND WITH CONTRAST TECHNIQUE: Multiplanar multisequence MR imaging of the abdomen was performed both before and after the administration of intravenous contrast. CONTRAST:  58mL GADAVIST GADOBUTROL 1 MMOL/ML IV SOLN COMPARISON:  CT AP 05/11/2020 FINDINGS: Lower chest: No acute findings. Hepatobiliary: Within segment 5 there is a T1 hypointense and mildly T2 hyperintense lesion adjacent to the gallbladder fossa which shows peripheral enhancement consistent with a metastatic lesion. This measures approximately 2.1 x 1.7 cm, image 49/16. Within the subcapsular aspect of the posterior right hepatic lobe there is a 1.3 x 0.7 cm T2 hyperintense and T1 hypointense structure without corresponding enhancement which is favored to represent a benign abnormality. The gallbladder is unremarkable. No bile duct dilatation identified. Pancreas: No mass, inflammatory changes, or other parenchymal abnormality identified. Spleen:  Within normal limits in size and appearance. Adrenals/Urinary Tract:  Normal appearance of the adrenal glands. Again seen are multiple peripherally enhancing right kidney lesions. The dominant lesion arises from the inferior pole of the right kidney measuring 5.1 x 5.4 cm, image 65/6. This is T1 hypointense, T2 isointense with mild peripheral enhancement. Similarly, arising from the medial cortex of the upper pole there is a irregular mass measuring 3.4 x 2.5 cm which is T2 isointense, T1 hypointense, with peripheral enhancement. This lesion also contains a thin internal area of linear enhancement, image 48/16. Also arising from the upper pole of the right kidney is a small peripherally enhancing lesion  which is T1 hypointense, teen 2 hyperintense and measures 1.9 x 1.5 cm, image 49/16. Small simple appearing cyst is identified involving the lateral cortex of the right kidney measuring 0.5 cm, image 18/4. 2 distinct lesions are noted arising from the upper pole of left kidney which have similar signal and enhancement characteristics to the suspicious right kidney lesions, image 34/18. The largest of these measures 1.5 cm. Stomach/Bowel: Visualized portions within the abdomen are unremarkable. Vascular/Lymphatic: No signs of renal vein involvement bilaterally. The IVC is patent without evidence for tumor thrombus. Aortic atherosclerosis. No aneurysm. No abdominal adenopathy. Other: No ascites or focal fluid collections identified within the imaged portions of the upper abdomen. Musculoskeletal: No suspicious bone lesions identified. IMPRESSION: 1. Again seen are multiple, bilateral peripherally enhancing kidney lesions. In the setting of a suspicious lung mass, the multiplicity, signal and enhancement characteristics of these lesions favor metastatic disease. Differential considerations include multiple cystic renal cell carcinomas versus multiple liver metastases. 2. Liver lesion adjacent to the gallbladder fossa has signal and enhancement characteristics favoring a metastatic lesion. Electronically Signed   By: Kerby Moors M.D.   On: 05/14/2020 05:11   US Venous Img Lower Bilateral (DVT)  Result Date: 05/12/2020 CLINICAL DATA:  Bilateral lower extremity pain.  Evaluate for DVT. EXAM: BILATERAL LOWER EXTREMITY VENOUS DOPPLER ULTRASOUND TECHNIQUE: Gray-scale sonography with graded compression, as well as color Doppler and duplex ultrasound were performed to evaluate the lower extremity deep venous systems from the level of the common femoral vein and including the common femoral, femoral, profunda femoral, popliteal and calf veins including the posterior tibial, peroneal and gastrocnemius veins when visible.  The superficial great saphenous vein was also interrogated. Spectral Doppler was utilized to evaluate flow at rest and with distal augmentation maneuvers in the common femoral, femoral and popliteal veins. COMPARISON:  None. FINDINGS: RIGHT LOWER EXTREMITY Common Femoral Vein: No evidence of thrombus. Normal compressibility, respiratory phasicity and response to augmentation. Saphenofemoral Junction: No evidence of thrombus. Normal compressibility and flow on color Doppler imaging. Profunda Femoral Vein: No evidence of thrombus. Normal compressibility and flow on color Doppler imaging. Femoral Vein: No evidence of thrombus. Normal compressibility, respiratory phasicity and response to augmentation.  Popliteal Vein: No evidence of thrombus. Normal compressibility, respiratory phasicity and response to augmentation. Calf Veins: No evidence of thrombus. Normal compressibility and flow on color Doppler imaging. Superficial Great Saphenous Vein: No evidence of thrombus. Normal compressibility. Venous Reflux:  None. Other Findings:  None. LEFT LOWER EXTREMITY Common Femoral Vein: No evidence of thrombus. Normal compressibility, respiratory phasicity and response to augmentation. Saphenofemoral Junction: No evidence of thrombus. Normal compressibility and flow on color Doppler imaging. Profunda Femoral Vein: No evidence of thrombus. Normal compressibility and flow on color Doppler imaging. Femoral Vein: No evidence of thrombus. Normal compressibility, respiratory phasicity and response to augmentation. Popliteal Vein: No evidence of thrombus. Normal compressibility, respiratory phasicity and response to augmentation. Calf Veins: No evidence of thrombus. Normal compressibility and flow on color Doppler imaging. Superficial Great Saphenous Vein: No evidence of thrombus. Normal compressibility. Venous Reflux:  None. Other Findings:  None. IMPRESSION: No evidence of DVT within either lower extremity. Electronically Signed   By:  Sandi Mariscal M.D.   On: 05/12/2020 17:38     Scheduled Meds: . feeding supplement  237 mL Oral TID BM  . [START ON 05/16/2020] heparin  5,000 Units Subcutaneous Q8H  . multivitamin-lutein  1 capsule Oral Daily  . pantoprazole  40 mg Oral Daily  . polyethylene glycol  17 g Oral Daily   Continuous Infusions: . sodium chloride 10 mL/hr at 05/13/20 1328    LOS: 3 days   Roxan Hockey, MD  05/14/2020, 5:00 PM

## 2020-05-15 ENCOUNTER — Other Ambulatory Visit: Payer: Self-pay

## 2020-05-15 ENCOUNTER — Ambulatory Visit (HOSPITAL_COMMUNITY): Payer: Medicare HMO

## 2020-05-15 ENCOUNTER — Inpatient Hospital Stay (HOSPITAL_COMMUNITY): Payer: Medicare HMO

## 2020-05-15 DIAGNOSIS — C787 Secondary malignant neoplasm of liver and intrahepatic bile duct: Secondary | ICD-10-CM | POA: Insufficient documentation

## 2020-05-15 DIAGNOSIS — G35 Multiple sclerosis: Secondary | ICD-10-CM

## 2020-05-15 DIAGNOSIS — Z7189 Other specified counseling: Secondary | ICD-10-CM

## 2020-05-15 DIAGNOSIS — Z515 Encounter for palliative care: Secondary | ICD-10-CM

## 2020-05-15 LAB — CBC WITH DIFFERENTIAL/PLATELET
Abs Immature Granulocytes: 0.04 10*3/uL (ref 0.00–0.07)
Basophils Absolute: 0 10*3/uL (ref 0.0–0.1)
Basophils Relative: 0 %
Eosinophils Absolute: 0.1 10*3/uL (ref 0.0–0.5)
Eosinophils Relative: 1 %
HCT: 36.2 % — ABNORMAL LOW (ref 39.0–52.0)
Hemoglobin: 11.4 g/dL — ABNORMAL LOW (ref 13.0–17.0)
Immature Granulocytes: 0 %
Lymphocytes Relative: 16 %
Lymphs Abs: 1.6 10*3/uL (ref 0.7–4.0)
MCH: 32.5 pg (ref 26.0–34.0)
MCHC: 31.5 g/dL (ref 30.0–36.0)
MCV: 103.1 fL — ABNORMAL HIGH (ref 80.0–100.0)
Monocytes Absolute: 1.1 10*3/uL — ABNORMAL HIGH (ref 0.1–1.0)
Monocytes Relative: 11 %
Neutro Abs: 6.9 10*3/uL (ref 1.7–7.7)
Neutrophils Relative %: 72 %
Platelets: 299 10*3/uL (ref 150–400)
RBC: 3.51 MIL/uL — ABNORMAL LOW (ref 4.22–5.81)
RDW: 12.3 % (ref 11.5–15.5)
WBC: 9.9 10*3/uL (ref 4.0–10.5)
nRBC: 0 % (ref 0.0–0.2)

## 2020-05-15 LAB — COMPREHENSIVE METABOLIC PANEL
ALT: 34 U/L (ref 0–44)
AST: 26 U/L (ref 15–41)
Albumin: 3 g/dL — ABNORMAL LOW (ref 3.5–5.0)
Alkaline Phosphatase: 115 U/L (ref 38–126)
Anion gap: 10 (ref 5–15)
BUN: 15 mg/dL (ref 8–23)
CO2: 26 mmol/L (ref 22–32)
Calcium: 9.3 mg/dL (ref 8.9–10.3)
Chloride: 100 mmol/L (ref 98–111)
Creatinine, Ser: 0.61 mg/dL (ref 0.61–1.24)
GFR, Estimated: >60 mL/min (ref >60)
Glucose, Bld: 102 mg/dL — ABNORMAL HIGH (ref 70–99)
Potassium: 4 mmol/L (ref 3.5–5.1)
Sodium: 136 mmol/L (ref 135–145)
Total Bilirubin: 0.6 mg/dL (ref 0.3–1.2)
Total Protein: 6.3 g/dL — ABNORMAL LOW (ref 6.5–8.1)

## 2020-05-15 LAB — MAGNESIUM: Magnesium: 1.9 mg/dL (ref 1.7–2.4)

## 2020-05-15 LAB — PROTIME-INR
INR: 1.1 (ref 0.8–1.2)
Prothrombin Time: 13.6 seconds (ref 11.4–15.2)

## 2020-05-15 MED ORDER — HYDROMORPHONE HCL 1 MG/ML IJ SOLN
1.0000 mg | Freq: Once | INTRAMUSCULAR | Status: AC
  Administered 2020-05-15: 1 mg via INTRAVENOUS
  Filled 2020-05-15: qty 1

## 2020-05-15 MED ORDER — FENTANYL CITRATE (PF) 100 MCG/2ML IJ SOLN
INTRAMUSCULAR | Status: AC | PRN
Start: 2020-05-15 — End: 2020-05-15
  Administered 2020-05-15: 50 ug via INTRAVENOUS

## 2020-05-15 MED ORDER — MIDAZOLAM HCL 5 MG/5ML IJ SOLN
INTRAMUSCULAR | Status: AC | PRN
  Administered 2020-05-15: 1 mg via INTRAVENOUS

## 2020-05-15 NOTE — Progress Notes (Addendum)
PROGRESS NOTE   Brandon Castillo  JSE:[id number] DOB: 09-Mar-1951 DOA: 05/11/2020 PCP: Patient, No Pcp Per   Chief Complaint  Patient presents with  . Abdominal Pain    Brief Admission History:  69 y.o. 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  He has not seen a regular physician since he was a child.  He says that he has been hospitalized on rare occasions many years ago but not seeing any regular providers.  He reports that he is a former heavy smoker.  He smoked 1 pack/day for about 30 years.  He says that he quit smoking several years ago.  Patient says that he has never had a colon cancer screening and no colonoscopy or EGD.  He says that he lives with his brother who serves as his primary caretaker.  He says that for the past month he has been experiencing decreasing appetite, weight loss, generalized malaise, generalized abdominal pain, fatigue and pain in the left groin and lower abdomen and difficulty urinating.  He also reports that he has been dealing with a growth on his scrotum that affects him when he tries to sit down.  He has been having occasional fever and chills.  He presented to the emergency department with his brother today because they have been concerned about his abnormal weight loss, abdominal pain generalized weakness and frailty.  CT abdomen was done with findings of bilateral renal masses, liver nodule, bilateral adrenal nodules and a right upper lobe density.  CT chest without contrast with findings of a right upper lobe mass which is suspicious for primary lung neoplasm with metastases seen and noted.  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  IR evaluation requested for possible tissue biopsy of liver lesion.   MRI abdomen reviewed with radiology service -Dr Delton Coombes requesting tissue diagnosis Dr Annamaria Boots has reviewed imaging and approves procedure--plan is for image guided biopsy of liver presumed metastatic lesions on 05/15/2020 at [facility]--- - discussed with Dr. Melvyn Novas   -05/15/20-patient is currently n.p.o. awaiting transfer for image guided biopsy today 05/15/2020  2)Elevated LFTs - secondary to liver metastases -ALT and AST has normalized  3)COPD/Emphysema - former heavy smoker - bronchodilators ordered as needed.   4) social/ethics--- patient is legally blind, his brother who lives not too far away from him helps him from time to time -Patient is a full code, palliative care consult requested for goals of care and advanced directives  5)Generalized anxiety - lorazepam ordered as needed.   6)Generalized abdominal pain and cancer pain - Oral and IV pain medications ordered as needed.   7)Sinus tachycardia - Resolved now.  Secondary to metastatic cancer - treating supportively.  Venous  dopplers of legs negative for DVT.   8)Scrotal sebaceous cyst -not really bothersome at this time, expectant management  9)Multiple Sclerosis - Pt reports that he is not followed for this and has not seen a provider in many years.   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  11) acute anemia--- MCV elevated, hemoglobin above 11, baseline usually around 13    --suspect neoplastic anemia -No evidence of ongoing bleeding, continue to monitor  DVT prophylaxis: SQ heparin   Code Status: Full   Family Communication: brother at bedside   Disposition Plan:  Possible discharge home with home health when with brother Consults called: oncology Delton Coombes) Rosario Jacks and IR Admission status: INP   Status is: Inpatient  Remains inpatient appropriate because:IV treatments appropriate due to intensity of illness or inability to take PO and Inpatient level of care appropriate due to severity of illness  Dispo: The patient is from: Home              Anticipated d/c is to: Home              Anticipated d/c date is: 3 days              Patient currently is not medically stable to d/c. --Patient going to [facility] on 05/15/2020 for image guided biopsy of the liver for presumed lung cancer with mets to the liver  Consultants:   Medical oncology  Interventional radiology   Pulm  Procedures:   Image guided liver biopsy  Antimicrobials:       Subjective: -05/15/20-patient is currently n.p.o. awaiting transfer for image guided biopsy today 05/15/2020 --Patient requesting additional pain medication prior to transfer to [facility]   Objective: Vitals:   05/13/20 2116 05/14/20 1424 05/14/20 2145 05/15/20 0614  BP: 137/68 117/70 134/70 138/72  Pulse: 70 83 82 67  Resp: 20 18 18 18   Temp: 97.6 F (36.4 C) 98.7 F (37.1 C) (!) 97.5 F (36.4 C) (!) 97.4 F (36.3 C)  TempSrc: Oral  Oral Oral  SpO2: 96% 98% 97% 98%  Weight:      Height:        Intake/Output Summary (Last 24 hours) at 05/15/2020 0942 Last data filed at 05/15/2020 0809 Gross per 24 hour  Intake 600 ml  Output 1150 ml  Net -550 ml   Filed Weights   05/11/20 1030  Weight: 68 kg    Examination:  General exam: Blind. Cachectic frail appearing male with dark glasses.   Respiratory system: Clear to auscultation. Respiratory effort normal. Cardiovascular system: normal S1 & S2  heard. No JVD, murmurs, rubs, gallops or clicks. No pedal edema. Gastrointestinal system: Abdomen is mildly distended with suprapubic tenderness. . Normal bowel sounds heard. Central nervous system: Alert and oriented. No new focal neurological deficits. Extremities: Symmetric 5 x 5 power. Skin: No rashes, lesions or ulcers  Psychiatry: Judgement and insight appear normal. Mood & affect appropriate.   Data Reviewed: I have personally reviewed following labs and imaging studies  CBC: Recent Labs  Lab 05/11/20 1047 05/12/20 0428 05/13/20 0607 05/14/20 0505 05/15/20 0449  WBC 13.4* 10.2 9.7 11.5* 9.9  NEUTROABS 10.8* 7.5 6.9 8.4* 6.9  HGB 13.0 11.8* 12.0* 13.5 11.4*  HCT 40.8 37.6* 38.0* 42.4 36.2*  MCV 102.8* 102.2* 102.4* 102.4* 103.1*  PLT 349 344 339 411* 448    Basic Metabolic Panel: Recent Labs  Lab 05/11/20 1047 05/12/20 0428  05/13/20 0607 05/14/20 0505 05/15/20 0449  NA 137 138 136 137 136  K 4.3 4.0 3.7 3.8 4.0  CL 99 103 102 100 100  CO2 26 23 24 26 26   GLUCOSE 109* 93 115* 122* 102*  BUN 18 13 13 10 15   CREATININE 0.66 0.53* 0.55* 0.66 0.61  CALCIUM 9.6 9.3 9.2 9.8 9.3  MG  --  2.0 1.9 1.9 1.9    GFR: Estimated Creatinine Clearance: 85 mL/min (by C-G formula based on SCr of 0.61 mg/dL).  Liver Function Tests: Recent Labs  Lab 05/11/20 1047 05/12/20 0428 05/13/20 0607 05/14/20 0505 05/15/20 0449  AST 59* 51* 24 27 26   ALT 51* 55* 31 35 34  ALKPHOS 179* 153* 130* 143* 115  BILITOT 0.5 0.9 0.7 0.6 0.6  PROT 7.3 6.5 6.4* 7.3 6.3*  ALBUMIN 3.6 3.1* 3.0* 3.3* 3.0*    CBG: No results for input(s): GLUCAP in the last 168 hours.  Recent Results (from the past 240 hour(s))  Respiratory Panel by RT PCR (Flu A&B, Covid) - Nasopharyngeal Swab     Status: None   Collection Time: 05/11/20  1:21 PM   Specimen: Nasopharyngeal Swab  Result Value Ref Range Status   SARS Coronavirus 2 by RT PCR NEGATIVE NEGATIVE Final    Comment: (NOTE) SARS-CoV-2 target  nucleic acids are NOT DETECTED.  The SARS-CoV-2 RNA is generally detectable in upper respiratoy specimens during the acute phase of infection. The lowest concentration of SARS-CoV-2 viral copies this assay can detect is 131 copies/mL. A negative result does not preclude SARS-Cov-2 infection and should not be used as the sole basis for treatment or other patient management decisions. A negative result may occur with  improper specimen collection/handling, submission of specimen other than nasopharyngeal swab, presence of viral mutation(s) within the areas targeted by this assay, and inadequate number of viral copies (<131 copies/mL). A negative result must be combined with clinical observations, patient history, and epidemiological information. The expected result is Negative.  Fact Sheet for Patients:  PinkCheek.be  Fact Sheet for Healthcare Providers:  GravelBags.it  This test is no t yet approved or cleared by the [place] FDA and  has been authorized for detection and/or diagnosis of SARS-CoV-2 by FDA under an Emergency Use Authorization (EUA). This EUA will remain  in effect (meaning this test can be used) for the duration of the COVID-19 declaration under Section 564(b)(1) of the Act, 21 U.S.C. section 360bbb-3(b)(1), unless the authorization is terminated or revoked sooner.     Influenza A by PCR NEGATIVE NEGATIVE Final   Influenza B by PCR NEGATIVE NEGATIVE Final    Comment: (NOTE) The Xpert Xpress SARS-CoV-2/FLU/RSV assay is intended as an aid in  the diagnosis of influenza from Nasopharyngeal swab specimens and  should not be used as a sole basis for treatment. Nasal washings and  aspirates are unacceptable for Xpert Xpress SARS-CoV-2/FLU/RSV  testing.  Fact Sheet for Patients: PinkCheek.be  Fact Sheet for Healthcare  Providers: GravelBags.it  This test is not yet approved or cleared by the [place] FDA and  has been authorized for detection and/or diagnosis of SARS-CoV-2 by  FDA under an Emergency Use Authorization (EUA). This EUA will remain  in effect (meaning this test can be used) for the duration of the  Covid-19 declaration under Section 564(b)(1) of the Act, 21  U.S.C. section 360bbb-3(b)(1), unless the authorization is  terminated or revoked. Performed at [facility], [address] [place], [place] [address]  Radiology Studies: MR ABDOMEN W WO CONTRAST  Result Date: 05/14/2020 CLINICAL DATA:  Evaluate for liver metastases. EXAM: MRI ABDOMEN WITHOUT AND WITH CONTRAST TECHNIQUE: Multiplanar multisequence MR imaging of the abdomen was performed both before and after the administration of intravenous contrast. CONTRAST:  52mL GADAVIST GADOBUTROL 1 MMOL/ML IV SOLN COMPARISON:  CT AP 05/11/2020 FINDINGS: Lower chest: No acute findings. Hepatobiliary: Within segment 5 there is a T1 hypointense and mildly T2 hyperintense lesion adjacent to the gallbladder fossa which shows peripheral enhancement consistent with a metastatic lesion. This measures approximately 2.1 x 1.7 cm, image 49/16. Within the subcapsular aspect of the posterior right hepatic lobe there is a 1.3 x 0.7 cm T2 hyperintense and T1 hypointense structure without corresponding enhancement which is favored to represent a benign abnormality. The gallbladder is unremarkable. No bile duct dilatation identified. Pancreas: No mass, inflammatory changes, or other parenchymal abnormality identified. Spleen:  Within normal limits in size and appearance. Adrenals/Urinary Tract:  Normal appearance of the adrenal glands. Again seen are multiple peripherally enhancing right kidney lesions. The dominant lesion arises from the inferior pole of the right kidney measuring 5.1 x 5.4 cm, image 65/6. This is T1 hypointense, T2  isointense with mild peripheral enhancement. Similarly, arising from the medial cortex of the upper pole there is a irregular mass measuring 3.4 x 2.5 cm which is T2 isointense, T1 hypointense, with peripheral enhancement. This lesion also contains a thin internal area of linear enhancement, image 48/16. Also arising from the upper pole of the right kidney is a small peripherally enhancing lesion which is T1 hypointense, teen 2 hyperintense and measures 1.9 x 1.5 cm, image 49/16. Small simple appearing cyst is identified involving the lateral cortex of the right kidney measuring 0.5 cm, image 18/4. 2 distinct lesions are noted arising from the upper pole of left kidney which have similar signal and enhancement characteristics to the suspicious right kidney lesions, image 34/18. The largest of these measures 1.5 cm. Stomach/Bowel: Visualized portions within the abdomen are unremarkable. Vascular/Lymphatic: No signs of renal vein involvement bilaterally. The IVC is patent without evidence for tumor thrombus. Aortic atherosclerosis. No aneurysm. No abdominal adenopathy. Other: No ascites or focal fluid collections identified within the imaged portions of the upper abdomen. Musculoskeletal: No suspicious bone lesions identified. IMPRESSION: 1. Again seen are multiple, bilateral peripherally enhancing kidney lesions. In the setting of a suspicious lung mass, the multiplicity, signal and enhancement characteristics of these lesions favor metastatic disease. Differential considerations include multiple cystic renal cell carcinomas versus multiple liver metastases. 2. Liver lesion adjacent to the gallbladder fossa has signal and enhancement characteristics favoring a metastatic lesion. Electronically Signed   By: Kerby Moors M.D.   On: 05/14/2020 05:11     Scheduled Meds: . feeding supplement  237 mL Oral TID BM  . [START ON 05/16/2020] heparin  5,000 Units Subcutaneous Q8H  . multivitamin-lutein  1 capsule Oral  Daily  . pantoprazole  40 mg Oral Daily  . polyethylene glycol  17 g Oral Daily   Continuous Infusions: . sodium chloride 10 mL/hr at 05/13/20 1328    LOS: 4 days   Roxan Hockey, MD  05/15/2020, 9:42 AM
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
## 2020-05-15 NOTE — TOC Initial Note (Signed)
Transition of Care [facility]) - Initial/Assessment Note    Patient Details  Name: Brandon Castillo MRN: [id number] Date of Birth: Mar 16, 1951  [facility] CM/SW Contact:    Iona Beard, [place] Phone Number: 05/15/2020, 6:45 PM  Clinical Narrative:                 Pt admitted for mass of upper lobe of right lung. TOC consulted for possible HH/Outpatient palliative needs. CSW spoke with pts brother Brandon Castillo [phone] who was in room with pt to assess. Brandon Castillo states that pt will have 24 hr care provided by himself in the home. Brandon Castillo states pt has a cane and walker at home and does not know of any other DME needs at the time. CSW asked about pt and family interest in Outpatient Palliative referral. Pts brother Brandon Castillo states he would like more information about this. CSW informed that TOC will follow up 11/5 to provide more info and make referral if wanted. Pt has never had [facility] services. Pts brother states that he would like to know if MD thinks pt needs HH before saying yes. TOC to follow for HH, DME, and OP Palliative needs.   Expected Discharge Plan: [place] Barriers to Discharge: Continued Medical Work up   Patient Goals and CMS Choice Patient states their goals for this hospitalization and ongoing recovery are:: Return home with brother   Choice offered to / list presented to : NA  Expected Discharge Plan and Services Expected Discharge Plan: [place] In-house Referral: Clinical Social Work, Hospice / Palliative Care Discharge Planning Services: NA Post Acute Care Choice: Home Health Living arrangements for the past 2 months: Single Family Home                                      Prior Living Arrangements/Services Living arrangements for the past 2 months: Single Family Home Lives with:: Siblings Patient language and need for interpreter reviewed:: Yes Do you feel safe going back to the place where you  live?: Yes        Care giver support system in place?: Yes (comment) Brandon Castillo (Brother) [phone])   Criminal Activity/Legal Involvement Pertinent to Current Situation/Hospitalization: No - Comment as needed  Activities of Daily Living Home Assistive Devices/Equipment: Environmental consultant (specify type) ADL Screening (condition at time of admission) Patient's cognitive ability adequate to safely complete daily activities?: No Is the patient deaf or have difficulty hearing?: No Does the patient have difficulty seeing, even when wearing glasses/contacts?: Yes (blind) Does the patient have difficulty concentrating, remembering, or making decisions?: No Patient able to express need for assistance with ADLs?: Yes Does the patient have difficulty dressing or bathing?: Yes Independently performs ADLs?: Yes (appropriate for developmental age) Does the patient have difficulty walking or climbing stairs?: Yes Weakness of Legs: Both Weakness of Arms/Hands: Both  Permission Sought/Granted                  Emotional Assessment Appearance:: Appears stated age     Orientation: : Oriented to Self, Oriented to Place, Oriented to  Time, Oriented to Situation Alcohol / Substance Use: Not Applicable Psych Involvement: No (comment)  Admission diagnosis:  Dehydration [E86.0] Generalized abdominal pain [R10.84] Liver metastases (HCC) [C78.7] Failure to thrive in adult [R62.7] Intraabdominal mass [R19.00] Mass of upper lobe of right lung [R91.8] Patient Active  Problem List   Diagnosis Date Noted  . Palliative care by specialist   . Goals of care, counseling/discussion   . Malnutrition of moderate degree 05/12/2020  . Failure to thrive in adult   . Intraabdominal mass   . Generalized abdominal pain 05/11/2020  . Abnormal weight loss 05/11/2020  . Multiple sclerosis ([place])   . 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 Nausea and vomiting   . Anorexia   . Difficulty urinating   . Leukocytosis   .  Sinus tachycardia   . Scrotal sebaceous cyst   . Bilateral renal masses   . Bilateral Adrenal nodules   . Mass of upper lobe of right lung   . Anxiety, generalized   . Elevated LFTs   . Emphysema lung ([place])   . COPD (chronic obstructive pulmonary disease) ([place])   . Former smoker   . Hyperglycemia    PCP:  Patient, No Pcp Per Pharmacy:   [facility], [place] [address] Phone: [phone] Fax: [phone]     Social Determinants of Health (SDOH) Interventions    Readmission Risk Interventions No flowsheet data found.

## 2020-05-15 NOTE — Procedures (Signed)
Interventional Radiology Procedure Note  Procedure: [place] rt liver mass bx    Complications: None  Estimated Blood Loss:  min  Findings: 18 g cores x 3    M. Daryll Brod, MD

## 2020-05-16 DIAGNOSIS — C3491 Malignant neoplasm of unspecified part of right bronchus or lung: Secondary | ICD-10-CM | POA: Diagnosis present

## 2020-05-16 LAB — COMPREHENSIVE METABOLIC PANEL
ALT: 40 U/L (ref 0–44)
AST: 35 U/L (ref 15–41)
Albumin: 3 g/dL — ABNORMAL LOW (ref 3.5–5.0)
Alkaline Phosphatase: 119 U/L (ref 38–126)
Anion gap: 10 (ref 5–15)
BUN: 17 mg/dL (ref 8–23)
CO2: 28 mmol/L (ref 22–32)
Calcium: 9.3 mg/dL (ref 8.9–10.3)
Chloride: 99 mmol/L (ref 98–111)
Creatinine, Ser: 0.53 mg/dL — ABNORMAL LOW (ref 0.61–1.24)
GFR, Estimated: >60 mL/min (ref >60)
Glucose, Bld: 112 mg/dL — ABNORMAL HIGH (ref 70–99)
Potassium: 3.9 mmol/L (ref 3.5–5.1)
Sodium: 137 mmol/L (ref 135–145)
Total Bilirubin: 0.5 mg/dL (ref 0.3–1.2)
Total Protein: 6.4 g/dL — ABNORMAL LOW (ref 6.5–8.1)

## 2020-05-16 LAB — CBC WITH DIFFERENTIAL/PLATELET
Abs Immature Granulocytes: 0.04 10*3/uL (ref 0.00–0.07)
Basophils Absolute: 0 10*3/uL (ref 0.0–0.1)
Basophils Relative: 0 %
Eosinophils Absolute: 0.2 10*3/uL (ref 0.0–0.5)
Eosinophils Relative: 1 %
HCT: 36.5 % — ABNORMAL LOW (ref 39.0–52.0)
Hemoglobin: 11.7 g/dL — ABNORMAL LOW (ref 13.0–17.0)
Immature Granulocytes: 0 %
Lymphocytes Relative: 16 %
Lymphs Abs: 1.7 10*3/uL (ref 0.7–4.0)
MCH: 32.5 pg (ref 26.0–34.0)
MCHC: 32.1 g/dL (ref 30.0–36.0)
MCV: 101.4 fL — ABNORMAL HIGH (ref 80.0–100.0)
Monocytes Absolute: 1.3 10*3/uL — ABNORMAL HIGH (ref 0.1–1.0)
Monocytes Relative: 12 %
Neutro Abs: 7.4 10*3/uL (ref 1.7–7.7)
Neutrophils Relative %: 71 %
Platelets: 365 10*3/uL (ref 150–400)
RBC: 3.6 MIL/uL — ABNORMAL LOW (ref 4.22–5.81)
RDW: 12.1 % (ref 11.5–15.5)
WBC: 10.6 10*3/uL — ABNORMAL HIGH (ref 4.0–10.5)
nRBC: 0 % (ref 0.0–0.2)

## 2020-05-16 MED ORDER — SENNOSIDES-DOCUSATE SODIUM 8.6-50 MG PO TABS
2.0000 | ORAL_TABLET | Freq: Every day | ORAL | 1 refills | Status: DC
Start: 2020-05-16 — End: 2020-06-20

## 2020-05-16 MED ORDER — ONDANSETRON HCL 4 MG PO TABS: 4.0000 mg | ORAL_TABLET | Freq: Four times a day (QID) | ORAL | 0 refills | Status: DC | PRN

## 2020-05-16 MED ORDER — ENSURE ENLIVE PO LIQD
237.0000 mL | Freq: Three times a day (TID) | ORAL | 12 refills | Status: DC
Start: 2020-05-16 — End: 2020-06-20

## 2020-05-16 MED ORDER — PANTOPRAZOLE SODIUM 40 MG PO TBEC
40.0000 mg | DELAYED_RELEASE_TABLET | Freq: Every day | ORAL | 3 refills | Status: DC
Start: 2020-05-17 — End: 2020-06-20

## 2020-05-16 MED ORDER — HYDROCODONE-ACETAMINOPHEN 10-325 MG PO TABS
1.0000 | ORAL_TABLET | Freq: Four times a day (QID) | ORAL | 0 refills | Status: DC | PRN
Start: 2020-05-16 — End: 2020-05-20

## 2020-05-16 MED ORDER — POLYETHYLENE GLYCOL 3350 17 G PO PACK
17.0000 g | PACK | Freq: Every day | ORAL | 1 refills | Status: DC
Start: 2020-05-17 — End: 2020-06-20

## 2020-05-16 MED ORDER — LORAZEPAM 1 MG PO TABS
1.0000 mg | ORAL_TABLET | Freq: Three times a day (TID) | ORAL | 0 refills | Status: DC | PRN
Start: 2020-05-16 — End: 2020-06-11

## 2020-05-16 MED ORDER — TRAZODONE HCL 100 MG PO TABS
50.0000 mg | ORAL_TABLET | Freq: Every day | ORAL | 1 refills | Status: DC
Start: 2020-05-16 — End: 2020-06-20

## 2020-05-16 MED ORDER — OCUVITE-LUTEIN PO CAPS
1.0000 | ORAL_CAPSULE | Freq: Every day | ORAL | 2 refills | Status: DC
Start: 2020-05-17 — End: 2020-06-20

## 2020-05-16 NOTE — Discharge Summary (Signed)
Brandon Castillo, is a 69 y.o. male  DOB 05-06-1951  MRN [id number].  Admission date:  05/11/2020  Admitting Physician  Murlean Iba, MD  Discharge Date:  05/16/2020   Primary MD  Patient, No Pcp Per  Recommendations for primary care physician for things to follow:   1)Please follow-up with Oncologist Dr. Derek Jack, MD -Address: inside [facility] (4th Floor), [place], [place], [place] [address] Phone: [phone] --If you did not get a call from the oncology office on Monday, 05/19/2020 please give them a call at Phone: [phone] --- The oncology office will help arrange for you to get a Port-A-Cath placed as an outpatient in order to allow for blood draws and administration of your chemotherapy medications  2)Home Health Registered Nurse and Physical Therapist will come to your home to help you get stronger  3)Ensure or Boost Nutritional supplements recommended  Admission Diagnosis  Dehydration [E86.0] Generalized abdominal pain [R10.84] Liver metastases ([place]) [C78.7] Failure to thrive in adult [R62.7] Intraabdominal mass [R19.00] Mass of upper lobe of right lung [R91.8]   Discharge Diagnosis  Dehydration [E86.0] Generalized abdominal pain [R10.84] Liver metastases ([place]) [C78.7] Failure to thrive in adult [R62.7] Intraabdominal mass [R19.00] Mass of upper lobe of right lung [R91.8]    Principal Problem:   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 (Pleural/Chest/Abdomen/Liver)/Non-Small Cell Carcinoma)  Active Problems:   Multiple sclerosis ([place])   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   COPD (chronic obstructive pulmonary disease) ([place])   Failure to thrive in adult   Goals of care, counseling/discussion   Generalized abdominal pain   Abnormal weight loss   Nausea and vomiting   Anorexia   Difficulty  urinating   Leukocytosis   Sinus tachycardia   Scrotal sebaceous cyst   Bilateral renal masses   Bilateral Adrenal nodules   Mass of upper lobe of right lung   Anxiety, generalized   Elevated LFTs   Emphysema lung ([place])   Former smoker   Hyperglycemia   Malnutrition of moderate degree   Intraabdominal mass   Palliative care by specialist      Past Medical History:  Diagnosis Date  . Multiple sclerosis ([place])     History reviewed. No pertinent surgical history.     HPI  from the history and physical done on the day of admission:    Chief Complaint: weakness   HPI: Brandon Castillo is a 69 y.o. 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  He has not seen a regular physician since he was a child.  He says that he has been hospitalized on rare occasions many years ago but not seeing any regular providers.  He reports that he is a former heavy smoker.  He smoked 1 pack/day for about 30 years.  He says that he quit smoking several years ago.  Patient says that he has never had a  colon cancer screening and no colonoscopy or EGD.  He says that he lives with his brother who serves as his primary caretaker.  He says that for the past month he has been experiencing decreasing appetite, weight loss, generalized malaise, generalized abdominal pain, fatigue and pain in the left groin and lower abdomen and difficulty urinating.  He also reports that he has been dealing with a growth on his scrotum that affects him when he tries to sit down.  He has been having occasional fever and chills.  He is not sure if he has had blood in the urine because he is blind.  He cannot tell me the color of his stool.  He presented to the emergency department with his brother today because they have been concerned about his abnormal weight loss, abdominal pain generalized weakness and frailty.  ED Course: The patient was afebrile on arrival with a temperature of  98.5, pulse 112, blood pressure 141/75, pulse ox 99% on room air.  Weight 68 kg.  BMI 22.14.  Glucose 109.  The sodium was 137, potassium 4.3, BUN 18, creatinine 0.66, alk phos 179, lipase 21, AST 59, ALT 51, WBC 13.4, hemoglobin 13.0, hematocrit 40.8, MCV 102, platelet count 349.  Influenza a and B and SARS 2 coronavirus test were all negative.  Urinalysis with findings of small amount of hemoglobin, ketones, trace leukocytes.  CT abdomen was done with findings of bilateral renal masses, liver nodule, bilateral adrenal nodules and a right upper lobe density.  CT chest without contrast with findings of a right upper lobe mass which is suspicious for primary lung neoplasm with metastases seen and noted.  Spoke with oncologist Dr. Delton Coombes who will consult on patient.  He says patient can remain at [facility].  He recommended IR evaluation for possible liver biopsy for tissue diagnosis.  Review of Systems: As per HPI otherwise 10 point review of systems negative.    Hospital Course:     Brief Admission History:  69 y.o.malewith a past medical history of multiple sclerosis and blindness secondary to MS complications. He has not seen a regular physician since he was a child. He says that he has been hospitalized on rare occasions many years ago but not seeing any regular providers. He reports that he is a former heavy smoker. He smoked 1 pack/day for about 30 years. He says that he quit smoking several years ago. Patient says that he has never had a colon cancer screening and no colonoscopy or EGD. He says that he lives with his brother who serves as his primary caretaker. He says that for the past month he has been experiencing decreasing appetite, weight loss, generalized malaise, generalized abdominal pain, fatigue and pain in the left groin and lower abdomen and difficulty urinating. He also reports that he has been dealing with a growth on his scrotum that affects him when he tries to sit  down. He has been having occasional fever and chills.  He presented to the emergency department with his brother today because they have been concerned about his abnormal weight loss, abdominal pain generalized weakness and frailty.  CT abdomen was done withfindings of bilateral renal masses, liver nodule, bilateral adrenal nodules and a right upper lobe density. CT chest without contrast with findings of a right upper lobe mass which is suspicious for primary lung neoplasm with metastases seen and noted.  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 Delton Coombes from oncology has reviewed available imaging --oncology and IR consult appreciated -On 05/15/2020 patient underwent image guided biopsy of liver presumed metastatic lesions  [facility]--- -Pathology from the liver lesion biopsy on 05/15/2020 age consistent with non-small cell cancer most likely pulmonary primary - discussed with Dr. Melvyn Novas   the pulmonologist -Plan is for outpatient follow-up with Dr. Delton Coombes, patient will need outpatient Port-A-Cath placement  2)Elevated LFTs - secondary to liver metastases -ALT and AST has normalized  3)COPD/Emphysema - former heavy smoker - bronchodilators ordered as needed.   4) social/ethics--- patient  is legally blind, his brother who lives not too far away from him helps him from time to time -Patient is a full code, palliative care consult requested for goals of care and advanced directives  5)Generalized anxiety - lorazepam ordered as needed.   6)Generalized abdominal pain and cancer pain - Oral and IV pain medications ordered as needed.   7)Sinus tachycardia - Resolved now.  Secondary to metastatic cancer - treating supportively. Venous dopplers of legs negative for DVT.   8)Scrotal sebaceous cyst -not really bothersome at this time, expectant management  9)Multiple Sclerosis - Pt reports that he is not followed for this and has not seen a provider in many years.   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 -May use trazodone for sleep  11) acute anemia--- MCV elevated, hemoglobin above 11, baseline usually around 13  --suspect neoplastic anemia -No evidence of ongoing bleeding,  Code Status:Full Family Communication:brother at bedside Disposition Plan: discharge home with home health  with brother Consults called:oncology ([place])/PCCM and IR  Dispo: The patient is from: Home  Anticipated d/c is to: Home Consultants:   Medical oncology  Interventional radiology   Pulm  Procedures:   Image guided liver biopsy on 05/15/2020  Discharge Condition: stable  Follow UP   Follow-up Information    [facility] Follow up.   Why: please call the office on Monday 05/19/20 to establish with a primary doctor Contact information: [address] [place] [address] [place], [place] Follow up.   Specialty: [place] Why: [facility] will call you to schedule nursing and physical therapy visits       palliative care [place] Follow up.   Why: Colletta Maryland from [facility] will call you to further explain the program  and services they offer and will arrange an in home appointment if you are interested       Derek Jack, MD. Schedule an appointment as soon as possible for a visit in 4 day(s).   Specialty: Hematology Why: Lung Cancer Contact information: [place] [address] [phone]  Consults obtained -   Diet and Activity recommendation:  As advised  Discharge Instructions     Discharge Instructions    Call MD for:  difficulty breathing, headache or visual disturbances   Complete by: As directed    Call MD for:  persistant dizziness or light-headedness   Complete by: As directed    Call MD for:  persistant nausea and vomiting   Complete by: As directed    Call MD for:  temperature >100.4   Complete by: As directed    Diet general   Complete by: As directed    Ensure or Boost nutritional supplements recommended   Discharge instructions   Complete by: As directed    1)Please follow-up with Oncologist Dr. Derek Jack, MD -Address: inside [facility] (4th Floor), [place], [place], [place] [address] Phone: [phone] --If you did not get a call from the oncology office on Monday, 05/19/2020 please give them a call at Phone: [phone] --- The oncology office will help arrange for you to get a Port-A-Cath placed as an outpatient in order to allow for blood draws and administration of your chemotherapy medications  2)Home Health Registered Nurse and Physical Therapist will come to your home to help you get stronger  3)Ensure or Boost Nutritional supplements recommended   Discharge wound care:   Complete by: As directed    As advised   Increase activity slowly   Complete by: As directed         Discharge Medications     Allergies as of 05/16/2020      Reactions   Demerol [meperidine Hcl]       Medication List    TAKE these medications   feeding supplement Liqd Take 237 mLs by mouth 3 (three) times daily.    HYDROcodone-acetaminophen 10-325 MG tablet Commonly known as: NORCO Take 1-2 tablets by mouth every 6 (six) hours as needed for severe pain. What changed:   how much to take  reasons to take this   LORazepam 1 MG tablet Commonly known as: ATIVAN Take 1 tablet (1 mg total) by mouth every 8 (eight) hours as needed for anxiety.   multivitamin-lutein Caps capsule Take 1 capsule by mouth daily. Start taking on: May 17, 2020   ondansetron 4 MG tablet Commonly known as: ZOFRAN Take 1 tablet (4 mg total) by mouth every 6 (six) hours as needed for nausea.   pantoprazole 40 MG tablet Commonly known as: PROTONIX Take 1 tablet (40 mg total) by mouth daily. Start taking on: May 17, 2020   polyethylene glycol 17 g packet Commonly known as: MIRALAX / GLYCOLAX Take 17 g by mouth daily. Start taking on: May 17, 2020   senna-docusate 8.6-50 MG tablet Commonly known as: Senokot-S Take 2 tablets by mouth at bedtime.   traZODone 100 MG tablet Commonly known as: DESYREL Take 0.5-1 tablets (50-100 mg total) by mouth at bedtime.            Discharge Care Instructions  (From admission, onward)         Start     Ordered   05/16/20 0000  Discharge wound care:       Comments: As advised   05/16/20 1411          Major procedures and Radiology Reports - PLEASE review detailed and final reports for all details, in brief -   DG Chest 2 View  Result Date: 05/11/2020 CLINICAL DATA:  Chest pain. EXAM: CHEST -  2 VIEW COMPARISON:  None. FINDINGS: The heart is normal in size. Mild tortuosity and calcification of the thoracic aorta. Slight ill-defined density in the right suprahilar/right upper lobe region. This is indeterminate finding but chest CT suggested to further evaluate. Lungs demonstrate hyperinflation and emphysematous changes. No pleural effusions. IMPRESSION: 1. Ill-defined right suprahilar/right upper lobe density. Chest CT suggested for further evaluate. 2.  Underlying emphysematous changes. Electronically Signed   By: Marijo Sanes M.D.   On: 05/11/2020 13:26   CT Chest Wo Contrast  Result Date: 05/11/2020 CLINICAL DATA:  Abnormal chest x-ray. EXAM: CT CHEST WITHOUT CONTRAST TECHNIQUE: Multidetector CT imaging of the chest was performed following the standard protocol without IV contrast. COMPARISON:  Chest x-ray, same date. FINDINGS: Cardiovascular: The heart is normal in size. No pericardial effusion. Mild tortuosity and ectasia of the thoracic aorta with scattered atherosclerotic calcifications. Scattered coronary artery calcifications. Mediastinum/Nodes: Right upper lobe lung mass is invading the right hilum and right mediastinum. I do not see any discrete measurable mediastinal lymph nodes. The esophagus is grossly normal. Lungs/Pleura: 3.2 cm spiculated right upper lobe lung mass medially invading the right hilum and the right mediastinum. Findings consistent with primary lung neoplasm. Advanced emphysematous changes and areas of pulmonary scarring. There are several scattered subpleural pulmonary nodules noted which are indeterminate. None of these measures more than 5 mm. Metastatic disease is certainly a consideration. No acute pulmonary findings.  No pleural effusions. Upper Abdomen: Bilateral adrenal gland nodules and upper pole right renal lesion as seen on today's abdominal/pelvic CT scan. Musculoskeletal: No worrisome bone lesions to suggest metastatic bone disease. IMPRESSION: 1. 3.2 cm spiculated right upper lobe lung mass invading the right hilum and right mediastinum consistent with primary lung neoplasm. 2. Several scattered subpleural pulmonary nodules are indeterminate. Metastatic disease is certainly a consideration. 3. Advanced emphysematous changes and pulmonary scarring. 4. Bilateral adrenal gland nodules and upper pole right renal lesion as seen on today's abdominal/pelvic CT scan. 5. No findings for osseous metastatic disease. 6.  Emphysema and aortic atherosclerosis. Aortic Atherosclerosis (ICD10-I70.0) and Emphysema (ICD10-J43.9). Electronically Signed   By: Marijo Sanes M.D.   On: 05/11/2020 14:26   MR BRAIN W WO CONTRAST  Result Date: 05/12/2020 CLINICAL DATA:  Lung mass EXAM: MRI HEAD WITHOUT AND WITH CONTRAST TECHNIQUE: Multiplanar, multiecho pulse sequences of the brain and surrounding structures were obtained without and with intravenous contrast. CONTRAST:  50m GADAVIST GADOBUTROL 1 MMOL/ML IV SOLN COMPARISON:  None. FINDINGS: Brain: There is no acute infarction or intracranial hemorrhage. There is no intracranial mass, mass effect, or edema. There is no hydrocephalus or extra-axial fluid collection. Prominence of the ventricles and sulci reflects minor generalized parenchymal volume loss. There is an incidental small right frontal developmental venous anomaly. 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 Patchy T2 hyperintensity in the supratentorial white matter is nonspecific but may reflect minor chronic microvascular ischemic changes. Vascular: Major vessel flow voids at the skull base are preserved. Skull and upper cervical spine: Normal marrow signal is preserved. Sinuses/Orbits: Paranasal sinuses are aerated. Orbits are unremarkable. Other: Sella is unremarkable.  Mastoid air cells are clear. IMPRESSION: No evidence of intracranial metastatic disease. Chronic/nonemergent findings detailed above. Electronically Signed   By: PMacy MisM.D.   On: 05/12/2020 10:19   MR ABDOMEN W WO CONTRAST  Result Date: 05/14/2020 CLINICAL DATA:  Evaluate for liver metastases. EXAM: MRI ABDOMEN WITHOUT AND WITH CONTRAST TECHNIQUE: Multiplanar multisequence MR imaging of the abdomen was performed both before  and after the administration of intravenous contrast. CONTRAST:  57m GADAVIST GADOBUTROL 1 MMOL/ML IV SOLN COMPARISON:  CT AP 05/11/2020 FINDINGS: Lower chest: No acute findings.  Hepatobiliary: Within segment 5 there is a T1 hypointense and mildly T2 hyperintense lesion adjacent to the gallbladder fossa which shows peripheral enhancement consistent with a metastatic lesion. This measures approximately 2.1 x 1.7 cm, image 49/16. Within the subcapsular aspect of the posterior right hepatic lobe there is a 1.3 x 0.7 cm T2 hyperintense and T1 hypointense structure without corresponding enhancement which is favored to represent a benign abnormality. The gallbladder is unremarkable. No bile duct dilatation identified. Pancreas: No mass, inflammatory changes, or other parenchymal abnormality identified. Spleen:  Within normal limits in size and appearance. Adrenals/Urinary Tract:  Normal appearance of the adrenal glands. Again seen are multiple peripherally enhancing right kidney lesions. The dominant lesion arises from the inferior pole of the right kidney measuring 5.1 x 5.4 cm, image 65/6. This is T1 hypointense, T2 isointense with mild peripheral enhancement. Similarly, arising from the medial cortex of the upper pole there is a irregular mass measuring 3.4 x 2.5 cm which is T2 isointense, T1 hypointense, with peripheral enhancement. This lesion also contains a thin internal area of linear enhancement, image 48/16. Also arising from the upper pole of the right kidney is a small peripherally enhancing lesion which is T1 hypointense, teen 2 hyperintense and measures 1.9 x 1.5 cm, image 49/16. Small simple appearing cyst is identified involving the lateral cortex of the right kidney measuring 0.5 cm, image 18/4. 2 distinct lesions are noted arising from the upper pole of left kidney which have similar signal and enhancement characteristics to the suspicious right kidney lesions, image 34/18. The largest of these measures 1.5 cm. Stomach/Bowel: Visualized portions within the abdomen are unremarkable. Vascular/Lymphatic: No signs of renal vein involvement bilaterally. The IVC is patent without  evidence for tumor thrombus. Aortic atherosclerosis. No aneurysm. No abdominal adenopathy. Other: No ascites or focal fluid collections identified within the imaged portions of the upper abdomen. Musculoskeletal: No suspicious bone lesions identified. IMPRESSION: 1. Again seen are multiple, bilateral peripherally enhancing kidney lesions. In the setting of a suspicious lung mass, the multiplicity, signal and enhancement characteristics of these lesions favor metastatic disease. Differential considerations include multiple cystic renal cell carcinomas versus multiple liver metastases. 2. Liver lesion adjacent to the gallbladder fossa has signal and enhancement characteristics favoring a metastatic lesion. Electronically Signed   By: TKerby MoorsM.D.   On: 05/14/2020 05:11   CT Abdomen Pelvis W Contrast  Addendum Date: 05/11/2020   ADDENDUM REPORT: 05/11/2020 13:43 ADDENDUM: Of note, an alternative differential consideration for the bilateral renal masses is renal abscesses. The hypoechoic mass in the liver could represent a benign hemangioma, and MR abdomen according could be considered for further evaluation as part of the patient's workup. These results were called by telephone at the time of interpretation on 05/11/2020 at 1:42 pm to [facility], who verbally acknowledged these results. Electronically Signed   By: TZerita BoersM.D.   On: 05/11/2020 13:43   Result Date: 05/11/2020 CLINICAL DATA:  Lower abdominal pain and generalized weakness for 2 weeks. EXAM: CT ABDOMEN AND PELVIS WITH CONTRAST TECHNIQUE: Multidetector CT imaging of the abdomen and pelvis was performed using the standard protocol following bolus administration of intravenous contrast. CONTRAST:  868mOMNIPAQUE IOHEXOL 300 MG/ML  SOLN COMPARISON:  Report from abdominal radiograph dated 03/05/2002. FINDINGS: Lower chest: No acute abnormality. Hepatobiliary: There is  a 1.6 cm hypoattenuating mass in hepatic segment 5/4B near  the gallbladder fossa. No gallstones, gallbladder wall thickening, or biliary dilatation. Pancreas: Unremarkable. No pancreatic ductal dilatation or surrounding inflammatory changes. Spleen: Normal in size without focal abnormality. Adrenals/Urinary Tract: Two left adrenal nodules measure approximately 0.8 and 0.9 cm (series 5, image 49 and image 46). The right adrenal gland appears normal. Multiple indistinct hypoechoic masses enlarged the right kidney with the largest measuring 6.2 cm in diameter. A mass is seen in the right renal pelvis, measuring 2.9 cm (series 7, image 16). At least two hypoechoic masses are seen in the left kidney, measuring up to 1.7 cm. A 7 mm mass in the lateral right kidney likely represents a benign cyst (series 2, image 33). There is no hydronephrosis. Bladder is unremarkable. Stomach/Bowel: Stomach is within normal limits. Appendix appears normal. There is colonic diverticulosis without evidence of diverticulitis. No evidence of bowel wall thickening, distention, or inflammatory changes. Vascular/Lymphatic: Aortic atherosclerosis. The inferior vena cava and both renal veins appear normal. No enlarged abdominal or pelvic lymph nodes. Reproductive: The prostate is mildly enlarged, measuring 5.1 cm in transverse dimension Other: No abdominal wall hernia or abnormality. No abdominopelvic ascites. Musculoskeletal: Degenerative changes are seen in the spine. IMPRESSION: 1. Multiple hypoechoic masses in both kidneys as well as a mass in the right renal pelvis. Differential considerations include renal cell carcinoma, transitional cell carcinoma, renal lymphoma, and metastatic disease. Indeterminate hypoattenuating mass in the liver and two left adrenal nodules may represent metastatic disease. CT abdomen pelvis according to a renal protocol could be considered for further evaluation. Aortic Atherosclerosis (ICD10-I70.0). Electronically Signed: By: Zerita Boers M.D. On: 05/11/2020 12:28   US  BIOPSY (LIVER)  Result Date: 05/15/2020 INDICATION: Right hilar mass, liver lesion adjacent to the gallbladder EXAM: ULTRASOUND GUIDED CORE BIOPSY OF RIGHT LIVER LESION MEDICATIONS: 1% LIDOCAINE LOCAL ANESTHESIA/SEDATION: Fentanyl 50 mcg IV; Versed 1.0 mg IV Moderate Sedation Time:  10 MINUTES The patient was continuously monitored during the procedure by the interventional radiology nurse under my direct supervision. PROCEDURE: The procedure, risks, benefits, and alternatives were explained to the patient. Questions regarding the procedure were encouraged and answered. The patient understands and consents to the procedure. previous imaging reviewed. preliminary ultrasound performed. the solid hypoechoic lesion adjacent to the gallbladder in the right lobe was localized and marked. this is well below the subcostal margin. Under sterile conditions and local anesthesia, a 17 gauge 6.8 cm access was advanced to the lesion under direct ultrasound. Needle position confirmed with ultrasound. 3 18 gauge core biopsies obtained of the lesion under direct ultrasound. Samples were intact and non fragmented. These were placed in formalin. Needle tract occluded with Gel-Foam. Postprocedure imaging demonstrates no hemorrhage or hematoma. Patient tolerated biopsy well. COMPLICATIONS: None immediate. FINDINGS: Imaging confirms needle placed into the right liver lesion adjacent to the gallbladder for core biopsy IMPRESSION: Successful ultrasound right liver lesion 18 gauge core biopsy Electronically Signed   By: Jerilynn Mages.  Shick M.D.   On: 05/15/2020 15:56   US Venous Img Lower Bilateral (DVT)  Result Date: 05/12/2020 CLINICAL DATA:  Bilateral lower extremity pain.  Evaluate for DVT. EXAM: BILATERAL LOWER EXTREMITY VENOUS DOPPLER ULTRASOUND TECHNIQUE: Gray-scale sonography with graded compression, as well as color Doppler and duplex ultrasound were performed to evaluate the lower extremity deep venous systems from the level of the  common femoral vein and including the common femoral, femoral, profunda femoral, popliteal and calf veins including the posterior tibial, peroneal and gastrocnemius  veins when visible. The superficial great saphenous vein was also interrogated. Spectral Doppler was utilized to evaluate flow at rest and with distal augmentation maneuvers in the common femoral, femoral and popliteal veins. COMPARISON:  None. FINDINGS: RIGHT LOWER EXTREMITY Common Femoral Vein: No evidence of thrombus. Normal compressibility, respiratory phasicity and response to augmentation. Saphenofemoral Junction: No evidence of thrombus. Normal compressibility and flow on color Doppler imaging. Profunda Femoral Vein: No evidence of thrombus. Normal compressibility and flow on color Doppler imaging. Femoral Vein: No evidence of thrombus. Normal compressibility, respiratory phasicity and response to augmentation. Popliteal Vein: No evidence of thrombus. Normal compressibility, respiratory phasicity and response to augmentation. Calf Veins: No evidence of thrombus. Normal compressibility and flow on color Doppler imaging. Superficial Great Saphenous Vein: No evidence of thrombus. Normal compressibility. Venous Reflux:  None. Other Findings:  None. LEFT LOWER EXTREMITY Common Femoral Vein: No evidence of thrombus. Normal compressibility, respiratory phasicity and response to augmentation. Saphenofemoral Junction: No evidence of thrombus. Normal compressibility and flow on color Doppler imaging. Profunda Femoral Vein: No evidence of thrombus. Normal compressibility and flow on color Doppler imaging. Femoral Vein: No evidence of thrombus. Normal compressibility, respiratory phasicity and response to augmentation. Popliteal Vein: No evidence of thrombus. Normal compressibility, respiratory phasicity and response to augmentation. Calf Veins: No evidence of thrombus. Normal compressibility and flow on color Doppler imaging. Superficial Great Saphenous  Vein: No evidence of thrombus. Normal compressibility. Venous Reflux:  None. Other Findings:  None. IMPRESSION: No evidence of DVT within either lower extremity. Electronically Signed   By: Sandi Mariscal M.D.   On: 05/12/2020 17:38    Micro Results   Recent Results (from the past 240 hour(s))  Respiratory Panel by RT PCR (Flu A&B, Covid) - Nasopharyngeal Swab     Status: None   Collection Time: 05/11/20  1:21 PM   Specimen: Nasopharyngeal Swab  Result Value Ref Range Status   SARS Coronavirus 2 by RT PCR NEGATIVE NEGATIVE Final    Comment: (NOTE) SARS-CoV-2 target nucleic acids are NOT DETECTED.  The SARS-CoV-2 RNA is generally detectable in upper respiratoy specimens during the acute phase of infection. The lowest concentration of SARS-CoV-2 viral copies this assay can detect is 131 copies/mL. A negative result does not preclude SARS-Cov-2 infection and should not be used as the sole basis for treatment or other patient management decisions. A negative result may occur with  improper specimen collection/handling, submission of specimen other than nasopharyngeal swab, presence of viral mutation(s) within the areas targeted by this assay, and inadequate number of viral copies (<131 copies/mL). A negative result must be combined with clinical observations, patient history, and epidemiological information. The expected result is Negative.  Fact Sheet for Patients:  PinkCheek.be  Fact Sheet for Healthcare Providers:  GravelBags.it  This test is no t yet approved or cleared by the [place] FDA and  has been authorized for detection and/or diagnosis of SARS-CoV-2 by FDA under an Emergency Use Authorization (EUA). This EUA will remain  in effect (meaning this test can be used) for the duration of the COVID-19 declaration under Section 564(b)(1) of the Act, 21 U.S.C. section 360bbb-3(b)(1), unless the authorization is  terminated or revoked sooner.     Influenza A by PCR NEGATIVE NEGATIVE Final   Influenza B by PCR NEGATIVE NEGATIVE Final    Comment: (NOTE) The Xpert Xpress SARS-CoV-2/FLU/RSV assay is intended as an aid in  the diagnosis of influenza from Nasopharyngeal swab specimens and  should not be used as a  sole basis for treatment. Nasal washings and  aspirates are unacceptable for Xpert Xpress SARS-CoV-2/FLU/RSV  testing.  Fact Sheet for Patients: PinkCheek.be  Fact Sheet for Healthcare Providers: GravelBags.it  This test is not yet approved or cleared by the [place] FDA and  has been authorized for detection and/or diagnosis of SARS-CoV-2 by  FDA under an Emergency Use Authorization (EUA). This EUA will remain  in effect (meaning this test can be used) for the duration of the  Covid-19 declaration under Section 564(b)(1) of the Act, 21  U.S.C. section 360bbb-3(b)(1), unless the authorization is  terminated or revoked. Performed at [facility], [address] [place], [place] [address]        Today   Subjective    Brandon Castillo today has no new complaints -His brother is at bedside          Patient has been seen and examined prior to discharge   Objective   Blood pressure (!) 148/81, pulse 83, temperature 97.6 F (36.4 C), temperature source Oral, resp. rate 20, height 5' 9"  (1.753 m), weight 68 kg, SpO2 100 %.   Intake/Output Summary (Last 24 hours) at 05/16/2020 1412 Last data filed at 05/16/2020 1344 Gross per 24 hour  Intake 840 ml  Output 700 ml  Net 140 ml    Exam Gen:- Awake Alert, no acute distress , frail, cachectic appearing HEENT:- [place].AT, No sclera icterus Eyes--patient is legally blind Neck-Supple Neck,No JVD,.  Lungs-  CTAB , good air movement bilaterally  CV- S1, S2 normal, regular Abd-  +ve B.Sounds, Abd Soft, No tenderness,    Extremity/Skin:- No  edema,   good pulses Psych-affect  is appropriate, oriented x3 Neuro-no new focal deficits, no tremors    Data Review   CBC w Diff:  Lab Results  Component Value Date   WBC 10.6 (H) 05/16/2020   HGB 11.7 (L) 05/16/2020   HCT 36.5 (L) 05/16/2020   PLT 365 05/16/2020   LYMPHOPCT 16 05/16/2020   MONOPCT 12 05/16/2020   EOSPCT 1 05/16/2020   BASOPCT 0 05/16/2020    CMP:  Lab Results  Component Value Date   NA 137 05/16/2020   K 3.9 05/16/2020   CL 99 05/16/2020   CO2 28 05/16/2020   BUN 17 05/16/2020   CREATININE 0.53 (L) 05/16/2020   PROT 6.4 (L) 05/16/2020   ALBUMIN 3.0 (L) 05/16/2020   BILITOT 0.5 05/16/2020   ALKPHOS 119 05/16/2020   AST 35 05/16/2020   ALT 40 05/16/2020  . Total Discharge time is about 33 minutes  Roxan Hockey M.D on 05/16/2020 at 2:12 PM  Go to [email or web address] -  for contact info  [facility] - Office  [phone]

## 2020-05-16 NOTE — Discharge Instructions (Signed)
1)Please follow-up with Oncologist Dr. Derek Jack, MD -Address: inside [facility] (4th Floor), [place], [place], [place] [address] Phone: [phone] --If you did not get a call from the oncology office on Monday, 05/19/2020 please give them a call at Phone: [phone] --- The oncology office will help arrange for you to get a Port-A-Cath placed as an outpatient in order to allow for blood draws and administration of your chemotherapy medications  2)Home Health Registered Nurse and Physical Therapist will come to your home to help you get stronger  3)Ensure or Boost Nutritional supplements recommended

## 2020-05-16 NOTE — TOC Transition Note (Signed)
Transition of Care [facility]) - CM/SW Discharge Note   Patient Details  Name: Brandon Castillo MRN: [id number] Date of Birth: February 20, 1951  [facility] CM/SW Contact:  Shade Flood, LCSW Phone Number: 05/16/2020, 1:45 PM   Clinical Narrative:     Pt stable for dc today per MD. Arranged HH with [facility] Dr. Delton Coombes to follow for [facility] orders per Dr. Fredric Dine. Linda aware. Attempted to discuss with pt's brother by phone though he did not answer on his cell, his home, or patient's room. Spoke with Colletta Maryland at [facility] and requested follow up phone call with pt's brother to explain palliative care program and offer services. Referral will be put on the hub for [facility] and she will follow up.  There are no other TOC needs identified for dc.   Final next level of care: [place] Barriers to Discharge: Barriers Resolved   Patient Goals and CMS Choice Patient states their goals for this hospitalization and ongoing recovery are:: Return home with brother   Choice offered to / list presented to : NA  Discharge Placement                       Discharge Plan and Services In-house Referral: Clinical Social Work, Hospice / Palliative Care Discharge Planning Services: NA Post Acute Care Choice: Home Health                    HH Arranged: PT, RN [facility] Agency: [place] ([place]) Date HH Agency Contacted: 05/16/20   Representative spoke with at [place]: [place] ([place]) Interventions     Readmission Risk Interventions No flowsheet data found.

## 2020-05-16 NOTE — Care Management Important Message (Signed)
Important Message  Patient Details  Name: Brandon Castillo MRN: [id number] Date of Birth: February 12, 1951   Medicare Important Message Given:  Yes     Tommy Medal 05/16/2020, 4:07 PM

## 2020-05-17 ENCOUNTER — Emergency Department (HOSPITAL_COMMUNITY): Payer: Medicare HMO

## 2020-05-17 ENCOUNTER — Inpatient Hospital Stay (HOSPITAL_COMMUNITY)
Admission: EM | Admit: 2020-05-17 | Discharge: 2020-05-20 | DRG: 522 | Disposition: A | Discharge status: Home Health | Payer: Medicare HMO | Attending: Family Medicine | Admitting: Family Medicine

## 2020-05-17 ENCOUNTER — Other Ambulatory Visit: Payer: Self-pay

## 2020-05-17 ENCOUNTER — Inpatient Hospital Stay (HOSPITAL_COMMUNITY): Payer: Medicare HMO

## 2020-05-17 ENCOUNTER — Encounter (HOSPITAL_COMMUNITY): Payer: Self-pay | Admitting: Emergency Medicine

## 2020-05-17 DIAGNOSIS — Z515 Encounter for palliative care: Secondary | ICD-10-CM | POA: Diagnosis not present

## 2020-05-17 DIAGNOSIS — G35 Multiple sclerosis: Secondary | ICD-10-CM | POA: Diagnosis present

## 2020-05-17 DIAGNOSIS — Z66 Do not resuscitate: Secondary | ICD-10-CM | POA: Diagnosis present

## 2020-05-17 DIAGNOSIS — W109XXA Fall (on) (from) unspecified stairs and steps, initial encounter: Secondary | ICD-10-CM | POA: Diagnosis present

## 2020-05-17 DIAGNOSIS — C787 Secondary malignant neoplasm of liver and intrahepatic bile duct: Secondary | ICD-10-CM | POA: Diagnosis present

## 2020-05-17 DIAGNOSIS — Y92009 Unspecified place in unspecified non-institutional (private) residence as the place of occurrence of the external cause: Principal | ICD-10-CM

## 2020-05-17 DIAGNOSIS — D630 Anemia in neoplastic disease: Secondary | ICD-10-CM | POA: Diagnosis present

## 2020-05-17 DIAGNOSIS — S0083XA Contusion of other part of head, initial encounter: Secondary | ICD-10-CM

## 2020-05-17 DIAGNOSIS — Z20822 Contact with and (suspected) exposure to covid-19: Secondary | ICD-10-CM | POA: Diagnosis present

## 2020-05-17 DIAGNOSIS — F411 Generalized anxiety disorder: Secondary | ICD-10-CM | POA: Diagnosis present

## 2020-05-17 DIAGNOSIS — D62 Acute posthemorrhagic anemia: Secondary | ICD-10-CM | POA: Diagnosis not present

## 2020-05-17 DIAGNOSIS — S7292XA Unspecified fracture of left femur, initial encounter for closed fracture: Secondary | ICD-10-CM | POA: Diagnosis present

## 2020-05-17 DIAGNOSIS — Z8249 Family history of ischemic heart disease and other diseases of the circulatory system: Secondary | ICD-10-CM

## 2020-05-17 DIAGNOSIS — H548 Legal blindness, as defined in USA: Secondary | ICD-10-CM | POA: Diagnosis present

## 2020-05-17 DIAGNOSIS — Z79899 Other long term (current) drug therapy: Secondary | ICD-10-CM

## 2020-05-17 DIAGNOSIS — F419 Anxiety disorder, unspecified: Secondary | ICD-10-CM | POA: Diagnosis present

## 2020-05-17 DIAGNOSIS — R54 Age-related physical debility: Secondary | ICD-10-CM | POA: Diagnosis present

## 2020-05-17 DIAGNOSIS — S52572A Other intraarticular fracture of lower end of left radius, initial encounter for closed fracture: Secondary | ICD-10-CM

## 2020-05-17 DIAGNOSIS — C3411 Malignant neoplasm of upper lobe, right bronchus or lung: Secondary | ICD-10-CM | POA: Diagnosis present

## 2020-05-17 DIAGNOSIS — T148XXA Other injury of unspecified body region, initial encounter: Secondary | ICD-10-CM

## 2020-05-17 DIAGNOSIS — J439 Emphysema, unspecified: Secondary | ICD-10-CM | POA: Diagnosis present

## 2020-05-17 DIAGNOSIS — H543 Unqualified visual loss, both eyes: Secondary | ICD-10-CM | POA: Diagnosis present

## 2020-05-17 DIAGNOSIS — C3491 Malignant neoplasm of unspecified part of right bronchus or lung: Secondary | ICD-10-CM | POA: Diagnosis present

## 2020-05-17 DIAGNOSIS — G4700 Insomnia, unspecified: Secondary | ICD-10-CM | POA: Diagnosis present

## 2020-05-17 DIAGNOSIS — E278 Other specified disorders of adrenal gland: Secondary | ICD-10-CM | POA: Diagnosis present

## 2020-05-17 DIAGNOSIS — S62102A Fracture of unspecified carpal bone, left wrist, initial encounter for closed fracture: Secondary | ICD-10-CM | POA: Diagnosis not present

## 2020-05-17 DIAGNOSIS — Z885 Allergy status to narcotic agent status: Secondary | ICD-10-CM | POA: Diagnosis not present

## 2020-05-17 DIAGNOSIS — W19XXXA Unspecified fall, initial encounter: Secondary | ICD-10-CM

## 2020-05-17 DIAGNOSIS — Z5948 Other specified lack of adequate food: Secondary | ICD-10-CM

## 2020-05-17 DIAGNOSIS — R627 Adult failure to thrive: Secondary | ICD-10-CM | POA: Diagnosis present

## 2020-05-17 DIAGNOSIS — S72002A Fracture of unspecified part of neck of left femur, initial encounter for closed fracture: Principal | ICD-10-CM

## 2020-05-17 DIAGNOSIS — Y92018 Other place in single-family (private) house as the place of occurrence of the external cause: Secondary | ICD-10-CM | POA: Diagnosis not present

## 2020-05-17 DIAGNOSIS — Z87891 Personal history of nicotine dependence: Secondary | ICD-10-CM | POA: Diagnosis not present

## 2020-05-17 DIAGNOSIS — L723 Sebaceous cyst: Secondary | ICD-10-CM | POA: Diagnosis present

## 2020-05-17 DIAGNOSIS — C7989 Secondary malignant neoplasm of other specified sites: Secondary | ICD-10-CM | POA: Diagnosis present

## 2020-05-17 DIAGNOSIS — K219 Gastro-esophageal reflux disease without esophagitis: Secondary | ICD-10-CM | POA: Diagnosis present

## 2020-05-17 HISTORY — DX: Malignant (primary) neoplasm, unspecified: C80.1

## 2020-05-17 LAB — COMPREHENSIVE METABOLIC PANEL
ALT: 64 U/L — ABNORMAL HIGH (ref 0–44)
AST: 43 U/L — ABNORMAL HIGH (ref 15–41)
Albumin: 3.2 g/dL — ABNORMAL LOW (ref 3.5–5.0)
Alkaline Phosphatase: 120 U/L (ref 38–126)
Anion gap: 11 (ref 5–15)
BUN: 14 mg/dL (ref 8–23)
CO2: 25 mmol/L (ref 22–32)
Calcium: 9.5 mg/dL (ref 8.9–10.3)
Chloride: 98 mmol/L (ref 98–111)
Creatinine, Ser: 0.54 mg/dL — ABNORMAL LOW (ref 0.61–1.24)
GFR, Estimated: >60 mL/min (ref >60)
Glucose, Bld: 142 mg/dL — ABNORMAL HIGH (ref 70–99)
Potassium: 3.8 mmol/L (ref 3.5–5.1)
Sodium: 134 mmol/L — ABNORMAL LOW (ref 135–145)
Total Bilirubin: 0.5 mg/dL (ref 0.3–1.2)
Total Protein: 6.6 g/dL (ref 6.5–8.1)

## 2020-05-17 LAB — URINALYSIS, ROUTINE W REFLEX MICROSCOPIC
Bilirubin Urine: NEGATIVE
Glucose, UA: NEGATIVE mg/dL
Ketones, ur: NEGATIVE mg/dL
Leukocytes,Ua: NEGATIVE
Nitrite: NEGATIVE
Protein, ur: 30 mg/dL — AB
Specific Gravity, Urine: 1.016 (ref 1.005–1.030)
pH: 5 (ref 5.0–8.0)

## 2020-05-17 LAB — CBC WITH DIFFERENTIAL/PLATELET
Abs Immature Granulocytes: 0.11 10*3/uL — ABNORMAL HIGH (ref 0.00–0.07)
Basophils Absolute: 0 10*3/uL (ref 0.0–0.1)
Basophils Relative: 0 %
Eosinophils Absolute: 0 10*3/uL (ref 0.0–0.5)
Eosinophils Relative: 0 %
HCT: 37.9 % — ABNORMAL LOW (ref 39.0–52.0)
Hemoglobin: 12 g/dL — ABNORMAL LOW (ref 13.0–17.0)
Immature Granulocytes: 1 %
Lymphocytes Relative: 5 %
Lymphs Abs: 0.9 10*3/uL (ref 0.7–4.0)
MCH: 32.3 pg (ref 26.0–34.0)
MCHC: 31.7 g/dL (ref 30.0–36.0)
MCV: 102.2 fL — ABNORMAL HIGH (ref 80.0–100.0)
Monocytes Absolute: 0.9 10*3/uL (ref 0.1–1.0)
Monocytes Relative: 5 %
Neutro Abs: 14.9 10*3/uL — ABNORMAL HIGH (ref 1.7–7.7)
Neutrophils Relative %: 89 %
Platelets: 331 10*3/uL (ref 150–400)
RBC: 3.71 MIL/uL — ABNORMAL LOW (ref 4.22–5.81)
RDW: 12.3 % (ref 11.5–15.5)
WBC: 16.8 10*3/uL — ABNORMAL HIGH (ref 4.0–10.5)
nRBC: 0 % (ref 0.0–0.2)

## 2020-05-17 LAB — RESPIRATORY PANEL BY RT PCR (FLU A&B, COVID)
Influenza A by PCR: NEGATIVE
Influenza B by PCR: NEGATIVE
SARS Coronavirus 2 by RT PCR: NEGATIVE

## 2020-05-17 LAB — PROTIME-INR
INR: 1.1 (ref 0.8–1.2)
Prothrombin Time: 13.7 seconds (ref 11.4–15.2)

## 2020-05-17 LAB — APTT: aPTT: 27 seconds (ref 24–36)

## 2020-05-17 MED ORDER — SODIUM CHLORIDE 0.9% FLUSH: 3.0000 mL | Freq: Two times a day (BID) | INTRAVENOUS | Status: DC

## 2020-05-17 MED ORDER — ONDANSETRON HCL 4 MG/2ML IJ SOLN: 4.0000 mg | Freq: Four times a day (QID) | INTRAMUSCULAR | Status: DC | PRN

## 2020-05-17 MED ORDER — POLYETHYLENE GLYCOL 3350 17 G PO PACK
17.0000 g | PACK | Freq: Every day | ORAL | Status: DC
  Administered 2020-05-18 – 2020-05-20 (×2): 17 g via ORAL
  Filled 2020-05-17 (×2): qty 1

## 2020-05-17 MED ORDER — SODIUM CHLORIDE 0.9% FLUSH
3.0000 mL | Freq: Two times a day (BID) | INTRAVENOUS | Status: DC
  Administered 2020-05-17: 3 mL via INTRAVENOUS

## 2020-05-17 MED ORDER — FENTANYL CITRATE (PF) 100 MCG/2ML IJ SOLN: 25.0000 ug | INTRAMUSCULAR | Status: DC | PRN

## 2020-05-17 MED ORDER — OCUVITE-LUTEIN PO CAPS: 1.0000 | ORAL_CAPSULE | Freq: Every day | ORAL | Status: DC

## 2020-05-17 MED ORDER — TRAZODONE HCL 50 MG PO TABS
50.0000 mg | ORAL_TABLET | Freq: Every day | ORAL | Status: DC
  Administered 2020-05-17 – 2020-05-19 (×3): 50 mg via ORAL
  Filled 2020-05-17 (×3): qty 1

## 2020-05-17 MED ORDER — ACETAMINOPHEN 325 MG PO TABS
650.0000 mg | ORAL_TABLET | Freq: Four times a day (QID) | ORAL | Status: DC | PRN
  Administered 2020-05-17: 650 mg via ORAL
  Filled 2020-05-17: qty 2

## 2020-05-17 MED ORDER — SERTRALINE HCL 50 MG PO TABS
50.0000 mg | ORAL_TABLET | Freq: Every day | ORAL | Status: DC
  Administered 2020-05-20: 50 mg via ORAL
  Filled 2020-05-17 (×2): qty 1

## 2020-05-17 MED ORDER — POLYETHYLENE GLYCOL 3350 17 G PO PACK: 17.0000 g | PACK | Freq: Every day | ORAL | Status: DC

## 2020-05-17 MED ORDER — SODIUM CHLORIDE 0.9% FLUSH: 3.0000 mL | INTRAVENOUS | Status: DC | PRN

## 2020-05-17 MED ORDER — ADULT MULTIVITAMIN W/MINERALS CH
1.0000 | ORAL_TABLET | Freq: Every day | ORAL | Status: DC
  Administered 2020-05-20: 1 via ORAL
  Filled 2020-05-17 (×2): qty 1

## 2020-05-17 MED ORDER — DEXTROSE-NACL 5-0.45 % IV SOLN: INTRAVENOUS | Status: DC

## 2020-05-17 MED ORDER — ACETAMINOPHEN 650 MG RE SUPP: 650.0000 mg | Freq: Four times a day (QID) | RECTAL | Status: DC | PRN

## 2020-05-17 MED ORDER — FENTANYL CITRATE (PF) 100 MCG/2ML IJ SOLN
50.0000 ug | Freq: Once | INTRAMUSCULAR | Status: AC
  Administered 2020-05-17: 50 ug via INTRAVENOUS
  Filled 2020-05-17: qty 2

## 2020-05-17 MED ORDER — CEFAZOLIN SODIUM-DEXTROSE 2-4 GM/100ML-% IV SOLN
2.0000 g | INTRAVENOUS | Status: AC
  Administered 2020-05-18: 2 g via INTRAVENOUS
  Filled 2020-05-17: qty 100

## 2020-05-17 MED ORDER — LORAZEPAM 1 MG PO TABS
1.0000 mg | ORAL_TABLET | Freq: Three times a day (TID) | ORAL | Status: DC | PRN
  Administered 2020-05-17: 1 mg via ORAL
  Filled 2020-05-17: qty 1

## 2020-05-17 MED ORDER — POVIDONE-IODINE 10 % EX SWAB
2.0000 "application " | Freq: Once | CUTANEOUS | Status: AC
  Administered 2020-05-18: 2 via TOPICAL

## 2020-05-17 MED ORDER — ENSURE PRE-SURGERY PO LIQD
296.0000 mL | Freq: Once | ORAL | Status: DC
  Filled 2020-05-17: qty 296

## 2020-05-17 MED ORDER — PANTOPRAZOLE SODIUM 40 MG PO TBEC
40.0000 mg | DELAYED_RELEASE_TABLET | Freq: Every day | ORAL | Status: DC
  Administered 2020-05-20: 40 mg via ORAL
  Filled 2020-05-17 (×2): qty 1

## 2020-05-17 MED ORDER — OXYCODONE HCL 5 MG PO TABS
10.0000 mg | ORAL_TABLET | ORAL | Status: DC | PRN
  Administered 2020-05-17 – 2020-05-20 (×7): 10 mg via ORAL
  Filled 2020-05-17 (×7): qty 2

## 2020-05-17 MED ORDER — SENNOSIDES-DOCUSATE SODIUM 8.6-50 MG PO TABS
2.0000 | ORAL_TABLET | Freq: Two times a day (BID) | ORAL | Status: DC
  Administered 2020-05-17: 2 via ORAL
  Filled 2020-05-17 (×6): qty 2

## 2020-05-17 MED ORDER — ONDANSETRON HCL 4 MG PO TABS: 4.0000 mg | ORAL_TABLET | Freq: Four times a day (QID) | ORAL | Status: DC | PRN

## 2020-05-17 MED ORDER — SODIUM CHLORIDE 0.9 % IV SOLN: 250.0000 mL | INTRAVENOUS | Status: DC | PRN

## 2020-05-17 MED ORDER — SODIUM CHLORIDE 0.9 % IV SOLN
1.0000 g | INTRAVENOUS | Status: DC
  Administered 2020-05-17: 1 g via INTRAVENOUS
  Filled 2020-05-17: qty 10

## 2020-05-17 MED ORDER — TRANEXAMIC ACID-NACL 1000-0.7 MG/100ML-% IV SOLN
1000.0000 mg | INTRAVENOUS | Status: AC
  Administered 2020-05-18: 1000 mg via INTRAVENOUS
  Filled 2020-05-17: qty 100

## 2020-05-17 MED ORDER — SENNOSIDES-DOCUSATE SODIUM 8.6-50 MG PO TABS: 2.0000 | ORAL_TABLET | Freq: Every day | ORAL | Status: DC

## 2020-05-17 MED ORDER — CHLORHEXIDINE GLUCONATE 4 % EX LIQD: 60.0000 mL | Freq: Once | CUTANEOUS | Status: DC

## 2020-05-17 MED ORDER — HEPARIN SODIUM (PORCINE) 5000 UNIT/ML IJ SOLN: 5000.0000 [IU] | Freq: Three times a day (TID) | INTRAMUSCULAR | Status: DC

## 2020-05-17 MED ORDER — POVIDONE-IODINE 10 % EX SWAB: 2.0000 "application " | Freq: Once | CUTANEOUS | Status: DC

## 2020-05-17 MED ORDER — ENSURE ENLIVE PO LIQD
237.0000 mL | Freq: Three times a day (TID) | ORAL | Status: DC
  Administered 2020-05-17: 1 mL via ORAL
  Administered 2020-05-17 – 2020-05-18 (×2): 237 mL via ORAL
  Administered 2020-05-18: 1 mL via ORAL
  Administered 2020-05-19: 237 mL via ORAL
  Administered 2020-05-19: 1 mL via ORAL
  Administered 2020-05-20: 237 mL via ORAL
  Filled 2020-05-17 (×6): qty 237

## 2020-05-17 MED ORDER — METHOCARBAMOL 500 MG PO TABS
500.0000 mg | ORAL_TABLET | Freq: Four times a day (QID) | ORAL | Status: DC
  Administered 2020-05-17 – 2020-05-20 (×9): 500 mg via ORAL
  Filled 2020-05-17 (×10): qty 1

## 2020-05-17 NOTE — Progress Notes (Signed)
Patient recently diagnosed with metastatic lung CA. Outpatient follow up with oncology is underway. Patient was admitted s/p fall with L hip and left wrist fx. MR L femur negative for metastatic disease. Plan for L THA tomorrow am. NPO after MN.  heparin d/c'd. Full consult in am. Dr. Jeannie Fend to weigh in about wrist early next week. Continue LUE splint for now. Will order CT L ankle and foot to r/o fx due to negative xrays.

## 2020-05-17 NOTE — Progress Notes (Signed)
Patient to CT scan

## 2020-05-17 NOTE — Progress Notes (Signed)
Patient discussed with Dr. Tomi Bamberger overnight and Dr. Laverta Baltimore this am.  Will need transfer to [facility] for definitive care.  Plan for MRI left Femur to rule out metastatic lesions.  Then surgery for Left hip with Dr. Lyla Glassing Sunday am, may eat until Saturday at [place].  Wrist surgery likely in a delayed fashion due to logistics and surgeon availability.

## 2020-05-17 NOTE — ED Provider Notes (Signed)
Blood pressure (!) 157/81, pulse (!) 101, temperature 98.2 F (36.8 C), temperature source Oral, resp. rate 19, height 5\' 9"  (1.753 m), weight 68 kg, SpO2 97 %.  In short, Brandon Castillo is a 69 y.o. male with a chief complaint of Fall .  Refer to the original H&P for additional details.  Spoke with Dr. Stann Mainland with Ortho. Plan for OR repair on Sunday. Requesting MRI of the left femur to evaluate for any mets in the area. Patient has bed assigned at [facility]. Will update TRH team and I have placed the MRI order to be done here if possible or at [facility] when the patient arrives.     Margette Fast, MD 05/17/20 [phone]

## 2020-05-17 NOTE — Progress Notes (Signed)
Prn given prior to MRI

## 2020-05-17 NOTE — ED Notes (Signed)
Called [facility] for transport to [facility]. 

## 2020-05-17 NOTE — ED Notes (Signed)
Brandon Castillo brother and [place], [phone]

## 2020-05-17 NOTE — Progress Notes (Signed)
Patient is back from CT scan.

## 2020-05-17 NOTE — Progress Notes (Signed)
Sugar tong splint noted to the left wrist.  Left arm elevated on pillow

## 2020-05-17 NOTE — Progress Notes (Signed)
Orthopedic Tech Progress Note Patient Details:  Brandon Castillo 04-27-1951 [id number]  Ortho Devices Type of Ortho Device: Sugartong splint Ortho Device/Splint Location: LLE Ortho Device/Splint Interventions: Ordered, Application, Adjustment   Post Interventions Patient Tolerated: Well Instructions Provided: Care of device   [name] 05/17/2020, 4:58 PM

## 2020-05-17 NOTE — Progress Notes (Signed)
69 year old male received from [place]- transported from [facility].  The patient reports that he has a history of MS and is Blind in both eyes.  He is wearing sunglasses.  The patient answers all questions appropriately and is very pleasant.  Left wrist is supported in a velcro splint.  He co having pain in his left hip area and is positioned as comfortable as possible with pillows  No skin issues noted.  Skin Assessment done- coccyx appears red but no broken skin areas noted.  Both heels are elevated, small amount of redness noted on the right heel.  Foam protective dressings have been placed on coccyx and bilateral heels.

## 2020-05-17 NOTE — ED Notes (Signed)
Left wrist splint applied, pt tolerated well.

## 2020-05-17 NOTE — H&P (Signed)
Patient Demographics:    Brandon Castillo, is a 69 y.o. 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 Please consider Port-A-Cath placement during this hospitalization prior to discharge in order to facilitate patient's chemotherapy post discharge  A/p 1)Status post mechanical fall with left Femur fracture and left wrist fracture--patient is left-hand dominant ---hip x-rays with mildly displaced left femoral neck fracture, -Left wrist x-rays with comminuted fracture of the distal radius with intra-articular extension and dorsal angulation ---EDP discussed case with on-call orthopedic surgeon Dr. Victorino December -Transfer to [facility] for definitive orthopedic intervention - As per orthopedic surgeon Dr. Benn Moulder for MRI left Femur to rule out metastatic lesions.  Then surgery for Left hip with Dr. Lyla Glassing Sunday (05/18/20) am -Wrist surgery likely in a delayed fashion due to logistics and surgeon availability  2)NewlyDiscovered primary lung cancer of the RUL with metastases -  Dr Annamaria Boots from interventional radiology and Dr. Delton Coombes from oncology has reviewed available imaging --oncology and IR consult appreciated -On 05/15/2020 patient underwent image guided biopsy of liver presumed metastatic lesions  [facility]--- -Pathology from  the liver lesion biopsy on 05/15/2020 is consistent with non-small cell cancer most likely pulmonary primary - discussed with Dr. Beather Arbour pulmonologist -Plan is for outpatient follow-up with Dr. 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   4) social/ethics--- patient is legally blind, his brother helps him from time to time,  -Patient is a full code, palliative care consult requested for goals of care and advanced directives  5)Generalized anxiety -Zoloft 50 mg daily, lorazepam ordered as needed.   6)Scrotal sebaceous cyst -not really bothersome at this time, expectant management  7)Multiple Sclerosis - Pt reports that he is not followed for this and has not seen a provider in many years.   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 -May use trazodone for sleep  9)acute anemia---MCV elevated, hemoglobin currently around 12, baseline usually around 13  --suspect neoplastic anemia -No evidence of ongoing bleeding,  10) possible UTI-leukocytosis may be partly reactive due to fall/trauma -Treat empirically with IV Rocephin pending urine culture results  Code Status:Full Family Communication:brother at bedside Disposition Plan:  Patient will most likely need SNF rehab post hip replacement   Disposition/Need for  in-Hospital Stay- patient unable to be discharged at this time due to --left hip fracture requiring surgical/orthopedic operative correction*  Status is: Inpatient  Remains inpatient appropriate because:left hip fracture requiring surgical/orthopedic operative correction*   Dispo: The patient is from: Home              Anticipated d/c is to: SNF              Anticipated d/c date is: 3 days              Patient currently is not medically stable to  d/c. Barriers: Not Clinically Stable- left hip fracture requiring surgical/orthopedic operative correction*  With History of - Reviewed by me  Past Medical History:  Diagnosis Date  . Cancer [facility])    Diagnosed yesterday  . Multiple sclerosis ([place])       History reviewed. No pertinent surgical history.    Chief Complaint  Patient presents with  . Fall      HPI:    Brandon Castillo  is a 69 y.o. male left handed male who is a reformed smoker with past medical history relevant for vision loss/blindness related to underlying multiple sclerosis, COPD/emphysema, anxiety disorder and recently diagnosed stage IV primary lung cancer of the non-small cell type with metastatic to the liver and abdomen who was discharged home on 05/16/2020 after work-up for metastatic malignancy -Upon discharge patient moved in with his brother, patient is legally blind he was not very familiar with the layout of his brother's house -He was trying to go from the bedroom to the bathroom he went the wrong way and fell down a flight of stairs sustaining left femoral fracture and left wrist/radial fractures   Pt fell at home after being dced home yesterday (05/16/20) -CT head without acute findings He sustained Lt Hip and Lt wrist Fractures-- -hip x-rays with mildly displaced left femoral neck fracture, -Left wrist x-rays with comminuted fracture of the distal radius with intra-articular extension and dorsal angulation - -EDP discussed case with on-call orthopedic surgeon Dr. Victorino December -Transfer to [facility] for definitive orthopedic intervention - As per orthopedic surgeon Dr. Benn Moulder for MRI left Femur to rule out metastatic lesions.  Then surgery for Left hip with Dr. Lyla Glassing Sunday (05/18/20) am -Wrist surgery likely in a delayed fashion due to logistics and surgeon availability  He is Re-admitted to [facility] for ortho Rx- - -UA suggestive of UTI, leukocytosis noted,  --AST is 43 with ALT of  64 patient has known hepatic metastasis from his lung cancer  --NB!! Please consider Port-A-Cath placement during this hospitalization prior to discharge in order to facilitate patient's chemotherapy post discharge   Review of systems:    In addition to the HPI above,   A full Review of  Systems was done, all other systems reviewed are negative except as noted above in HPI , .    Social History:  Reviewed by me    Social History   Tobacco Use  . Smoking status: Smoked for 30 yrs/quit  . Smokeless tobacco: Never Used  Substance Use Topics  . Alcohol use: Not Currently       Family History :  Reviewed by me   History reviewed.  HTN   Home Medications:   Prior to Admission medications   Medication Sig Start Date End Date Taking? Authorizing Provider  feeding supplement (ENSURE ENLIVE / ENSURE PLUS) LIQD Take 237 mLs by mouth 3 (three) times daily. 05/16/20  Yes Deyana Wnuk,  MD  HYDROcodone-acetaminophen (NORCO) 10-325 MG tablet Take 1-2 tablets by mouth every 6 (six) hours as needed for severe pain. 05/16/20  Yes Karra Pink, MD  LORazepam (ATIVAN) 1 MG tablet Take 1 tablet (1 mg total) by mouth every 8 (eight) hours as needed for anxiety. 05/16/20  Yes Josephus Harriger, MD  multivitamin-lutein (OCUVITE-LUTEIN) CAPS capsule Take 1 capsule by mouth daily. 05/17/20  Yes Georgia Baria, MD  ondansetron (ZOFRAN) 4 MG tablet Take 1 tablet (4 mg total) by mouth every 6 (six) hours as needed for nausea. 05/16/20  Yes Sharalyn Lomba, MD  pantoprazole (PROTONIX) 40 MG tablet Take 1 tablet (40 mg total) by mouth daily. 05/17/20  Yes Geena Weinhold, MD  polyethylene glycol (MIRALAX / GLYCOLAX) 17 g packet Take 17 g by mouth daily. 05/17/20  Yes Montague Corella, MD  senna-docusate (SENOKOT-S) 8.6-50 MG tablet Take 2 tablets by mouth at bedtime. 05/16/20 05/16/21 Yes Roxan Hockey, MD  traZODone (DESYREL) 100 MG tablet Take 0.5-1 tablets (50-100 mg total) by mouth at bedtime.  05/16/20  Yes Roxan Hockey, MD     Allergies:     Allergies  Allergen Reactions  . Demerol [Meperidine Hcl]      Physical Exam:   Vitals  Blood pressure (!) 146/76, pulse 96, temperature 98.2 F (36.8 C), temperature source Oral, resp. rate 19, height _0  (1.753 m), weight 68 kg, SpO2 96 %.  Gen:- Awake Alert, no acute distress , frail, cachectic appearing HEENT:- [place].AT, No sclera icterus Eyes--patient is legally blind Neck-Supple Neck,No JVD,.  Lungs-  CTAB , Fair air movement bilaterally  CV- S1, S2 normal, regular Abd-  +ve B.Sounds, Abd Soft, No tenderness,    Extremity/Skin:- No  edema,   good pulses Psych-affect is appropriate, oriented x3 Neuro-no new focal deficits, no tremors , patient is left-hand dominant MSK-left lower extremity is shortened and externally rotated, point tenderness over the left hip area-pedal pulse intact -Left wrist deformity and swelling noted-capillary refill and radial pulse intact      Data Review:    CBC Recent Labs  Lab 05/13/20 0607 05/14/20 0505 05/15/20 0449 05/16/20 0647 05/17/20 0446  WBC 9.7 11.5* 9.9 10.6* 16.8*  HGB 12.0* 13.5 11.4* 11.7* 12.0*  HCT 38.0* 42.4 36.2* 36.5* 37.9*  PLT 339 411* 299 365 331  MCV 102.4* 102.4* 103.1* 101.4* 102.2*  MCH 32.3 32.6 32.5 32.5 32.3  MCHC 31.6 31.8 31.5 32.1 31.7  RDW 12.1 12.1 12.3 12.1 12.3  LYMPHSABS 1.8 1.7 1.6 1.7 0.9  MONOABS 0.8 1.1* 1.1* 1.3* 0.9  EOSABS 0.1 0.2 0.1 0.2 0.0  BASOSABS 0.0 0.0 0.0 0.0 0.0   ------------------------------------------------------------------------------------------------------------------  Chemistries  Recent Labs  Lab 05/12/20 0428 05/12/20 0428 05/13/20 0607 05/14/20 0505 05/15/20 0449 05/16/20 0647 05/17/20 0446  NA 138   < > 136 137 136 137 134*  K 4.0   < > 3.7 3.8 4.0 3.9 3.8  CL 103   < > 102 100 100 99 98  CO2 23   < > _1 GLUCOSE 93   < > 115* 122* 102* 112* 142*  BUN 13   < > _2 CREATININE 0.53*   < > 0.55* 0.66 0.61 0.53* 0.54*  CALCIUM 9.3   < > 9.2 9.8 9.3 9.3 9.5  MG 2.0  --  1.9 1.9 1.9  --   --   AST 51*   < > _3 35 43*  ALT 55*   < > 31 35 34 40 64*  ALKPHOS 153*   < > 130* 143* 115 119 120  BILITOT 0.9   < > 0.7 0.6 0.6 0.5 0.5   < > = values in this interval not displayed.   ------------------------------------------------------------------------------------------------------------------ estimated creatinine clearance is 85 mL/min (A) (by C-G formula based on SCr of 0.54 mg/dL (L)). ------------------------------------------------------------------------------------------------------------------ No results for input(s): TSH, T4TOTAL, T3FREE, THYROIDAB in the last 72 hours.  Invalid input(s): FREET3   Coagulation profile Recent Labs  Lab 05/15/20 0449 05/17/20 0446  INR 1.1 1.1   ------------------------------------------------------------------------------------------------------------------- No results for input(s): DDIMER in the last 72 hours. -------------------------------------------------------------------------------------------------------------------  Cardiac Enzymes No results for input(s): CKMB, TROPONINI, MYOGLOBIN in the last 168 hours.  Invalid input(s): CK ------------------------------------------------------------------------------------------------------------------ No results found for: BNP   ---------------------------------------------------------------------------------------------------------------  Urinalysis    Component Value Date/Time   COLORURINE YELLOW 05/17/2020 [place] 05/17/2020 0439   LABSPEC 1.016 05/17/2020 0439   PHURINE 5.0 05/17/2020 0439   GLUCOSEU NEGATIVE 05/17/2020 0439   HGBUR SMALL (A) 05/17/2020 [place] NEGATIVE 05/17/2020 [place] NEGATIVE 05/17/2020 0439   PROTEINUR 30 (A) 05/17/2020 0439   NITRITE NEGATIVE 05/17/2020 0439   LEUKOCYTESUR  NEGATIVE 05/17/2020 0439    ----------------------------------------------------------------------------------------------------------------   Imaging Results:    DG Chest 1 View  Result Date: 05/17/2020 CLINICAL DATA:  Initial evaluation for acute trauma, fall. EXAM: CHEST  1 VIEW COMPARISON:  Prior radiograph from 05/11/2020. FINDINGS: Cardiac and mediastinal silhouettes are stable in size and contour, and may within normal limits. Aortic atherosclerosis. Lungs are hyperinflated with underlying emphysematous changes. No focal infiltrates. No edema or effusion. No pneumothorax. No acute osseous finding. IMPRESSION: 1. No active cardiopulmonary disease. 2. Aortic Atherosclerosis (ICD10-I70.0) and Emphysema (ICD10-J43.9). Electronically Signed   By: Jeannine Boga M.D.   On: 05/17/2020 04:27   DG Wrist Complete Left  Result Date: 05/17/2020 CLINICAL DATA:  Initial evaluation for acute pain and swelling, fall downstairs. EXAM: LEFT WRIST - COMPLETE 3+ VIEW COMPARISON:  None. FINDINGS: Acute comminuted fracture of the distal right radius with slight impaction and dorsal angulation. Associated intra-articular extension. Distal ulna intact. Diffuse soft tissue swelling present about the wrist. IMPRESSION: Acute comminuted fracture of the distal right radius with intra-articular extension and dorsal angulation. Electronically Signed   By: Jeannine Boga M.D.   On: 05/17/2020 04:19   DG Ankle Complete Left  Result Date: 05/17/2020 CLINICAL DATA:  Initial evaluation for acute trauma, fall. EXAM: LEFT ANKLE COMPLETE - 3+ VIEW COMPARISON:  None. FINDINGS: No acute fracture dislocation. Ankle mortise approximated. Corticated osseous density at the medial malleolus consistent with a chronic finding. Diffuse soft tissue swelling present about the ankle. Underlying osteoarthritic changes noted. IMPRESSION: 1. No acute fracture or dislocation. 2. Diffuse soft tissue swelling about the ankle.  Electronically Signed   By: Jeannine Boga M.D.   On: 05/17/2020 04:21   CT Head Wo Contrast  Result Date: 05/17/2020 CLINICAL DATA:  Status post trauma.  Fall. EXAM: CT HEAD WITHOUT CONTRAST TECHNIQUE: Contiguous axial images were obtained from the base of the skull through the vertex without intravenous contrast. COMPARISON:  Brain MRI 05/12/2020 FINDINGS: Brain: No evidence of acute infarction, hemorrhage, hydrocephalus, extra-axial collection or mass lesion/mass effect. There is mild diffuse low-attenuation within the subcortical and periventricular white matter compatible with chronic microvascular disease. Prominence of the sulci and ventricles compatible with mild brain atrophy. Vascular: No hyperdense vessel or unexpected calcification.  Skull: Normal. Negative for fracture or focal lesion. Sinuses/Orbits: No acute finding. Other: None. IMPRESSION: 1. No acute intracranial abnormalities. 2. Chronic small vessel ischemic change and brain atrophy. Electronically Signed   By: Kerby Moors M.D.   On: 05/17/2020 04:10   US BIOPSY (LIVER)  Result Date: 05/15/2020 INDICATION: Right hilar mass, liver lesion adjacent to the gallbladder EXAM: ULTRASOUND GUIDED CORE BIOPSY OF RIGHT LIVER LESION MEDICATIONS: 1% LIDOCAINE LOCAL ANESTHESIA/SEDATION: Fentanyl 50 mcg IV; Versed 1.0 mg IV Moderate Sedation Time:  10 MINUTES The patient was continuously monitored during the procedure by the interventional radiology nurse under my direct supervision. PROCEDURE: The procedure, risks, benefits, and alternatives were explained to the patient. Questions regarding the procedure were encouraged and answered. The patient understands and consents to the procedure. previous imaging reviewed. preliminary ultrasound performed. the solid hypoechoic lesion adjacent to the gallbladder in the right lobe was localized and marked. this is well below the subcostal margin. Under sterile conditions and local anesthesia, a 17 gauge  6.8 cm access was advanced to the lesion under direct ultrasound. Needle position confirmed with ultrasound. 3 18 gauge core biopsies obtained of the lesion under direct ultrasound. Samples were intact and non fragmented. These were placed in formalin. Needle tract occluded with Gel-Foam. Postprocedure imaging demonstrates no hemorrhage or hematoma. Patient tolerated biopsy well. COMPLICATIONS: None immediate. FINDINGS: Imaging confirms needle placed into the right liver lesion adjacent to the gallbladder for core biopsy IMPRESSION: Successful ultrasound right liver lesion 18 gauge core biopsy Electronically Signed   By: Jerilynn Mages.  Shick M.D.   On: 05/15/2020 15:56   DG Foot Complete Left  Result Date: 05/17/2020 CLINICAL DATA:  Initial evaluation for acute trauma, fall. EXAM: LEFT FOOT - COMPLETE 3+ VIEW COMPARISON:  None. FINDINGS: No acute fracture or dislocation. Joint spaces maintained. Mild soft tissue swelling about the midfoot and ankle. 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 IMPRESSION: 1. No acute fracture or dislocation. 2. Mild soft tissue swelling about the midfoot and ankle. Electronically Signed   By: Jeannine Boga M.D.   On: 05/17/2020 04:25   DG Hip Unilat W or Wo Pelvis 2-3 Views Left  Result Date: 05/17/2020 CLINICAL DATA:  Initial evaluation for acute trauma, fall. EXAM: DG HIP (WITH OR WITHOUT PELVIS) 2-3V LEFT COMPARISON:  None. FINDINGS: Acute fracture extends through the left femoral neck with slight superior subluxation. Femoral head remains in normal alignment with the acetabulum. Acetabulum itself appears intact. Bony pelvis intact. Limited views of the right hip demonstrate no acute finding. Underlying osteopenia. Scattered vascular calcifications noted about the inguinal regions bilaterally. IMPRESSION: Acute mildly displaced fracture of the left femoral neck. Electronically Signed   By: Jeannine Boga M.D.   On: 05/17/2020 04:22    Radiological Exams on  Admission: DG Chest 1 View  Result Date: 05/17/2020 CLINICAL DATA:  Initial evaluation for acute trauma, fall. EXAM: CHEST  1 VIEW COMPARISON:  Prior radiograph from 05/11/2020. FINDINGS: Cardiac and mediastinal silhouettes are stable in size and contour, and may within normal limits. Aortic atherosclerosis. Lungs are hyperinflated with underlying emphysematous changes. No focal infiltrates. No edema or effusion. No pneumothorax. No acute osseous finding. IMPRESSION: 1. No active cardiopulmonary disease. 2. Aortic Atherosclerosis (ICD10-I70.0) and Emphysema (ICD10-J43.9). Electronically Signed   By: Jeannine Boga M.D.   On: 05/17/2020 04:27   DG Wrist Complete Left  Result Date: 05/17/2020 CLINICAL DATA:  Initial evaluation for acute pain and swelling, fall downstairs. EXAM: LEFT WRIST - COMPLETE 3+ VIEW COMPARISON:  None. FINDINGS: Acute comminuted fracture of the distal right radius with slight impaction and dorsal angulation. Associated intra-articular extension. Distal ulna intact. Diffuse soft tissue swelling present about the wrist. IMPRESSION: Acute comminuted fracture of the distal right radius with intra-articular extension and dorsal angulation. Electronically Signed   By: Jeannine Boga M.D.   On: 05/17/2020 04:19   DG Ankle Complete Left  Result Date: 05/17/2020 CLINICAL DATA:  Initial evaluation for acute trauma, fall. EXAM: LEFT ANKLE COMPLETE - 3+ VIEW COMPARISON:  None. FINDINGS: No acute fracture dislocation. Ankle mortise approximated. Corticated osseous density at the medial malleolus consistent with a chronic finding. Diffuse soft tissue swelling present about the ankle. Underlying osteoarthritic changes noted. IMPRESSION: 1. No acute fracture or dislocation. 2. Diffuse soft tissue swelling about the ankle. Electronically Signed   By: Jeannine Boga M.D.   On: 05/17/2020 04:21   CT Head Wo Contrast  Result Date: 05/17/2020 CLINICAL DATA:  Status post trauma.   Fall. EXAM: CT HEAD WITHOUT CONTRAST TECHNIQUE: Contiguous axial images were obtained from the base of the skull through the vertex without intravenous contrast. COMPARISON:  Brain MRI 05/12/2020 FINDINGS: Brain: No evidence of acute infarction, hemorrhage, hydrocephalus, extra-axial collection or mass lesion/mass effect. There is mild diffuse low-attenuation within the subcortical and periventricular white matter compatible with chronic microvascular disease. Prominence of the sulci and ventricles compatible with mild brain atrophy. Vascular: No hyperdense vessel or unexpected calcification. Skull: Normal. Negative for fracture or focal lesion. Sinuses/Orbits: No acute finding. Other: None. IMPRESSION: 1. No acute intracranial abnormalities. 2. Chronic small vessel ischemic change and brain atrophy. Electronically Signed   By: Kerby Moors M.D.   On: 05/17/2020 04:10   US BIOPSY (LIVER)  Result Date: 05/15/2020 INDICATION: Right hilar mass, liver lesion adjacent to the gallbladder EXAM: ULTRASOUND GUIDED CORE BIOPSY OF RIGHT LIVER LESION MEDICATIONS: 1% LIDOCAINE LOCAL ANESTHESIA/SEDATION: Fentanyl 50 mcg IV; Versed 1.0 mg IV Moderate Sedation Time:  10 MINUTES The patient was continuously monitored during the procedure by the interventional radiology nurse under my direct supervision. PROCEDURE: The procedure, risks, benefits, and alternatives were explained to the patient. Questions regarding the procedure were encouraged and answered. The patient understands and consents to the procedure. previous imaging reviewed. preliminary ultrasound performed. the solid hypoechoic lesion adjacent to the gallbladder in the right lobe was localized and marked. this is well below the subcostal margin. Under sterile conditions and local anesthesia, a 17 gauge 6.8 cm access was advanced to the lesion under direct ultrasound. Needle position confirmed with ultrasound. 3 18 gauge core biopsies obtained of the lesion under  direct ultrasound. Samples were intact and non fragmented. These were placed in formalin. Needle tract occluded with Gel-Foam. Postprocedure imaging demonstrates no hemorrhage or hematoma. Patient tolerated biopsy well. COMPLICATIONS: None immediate. FINDINGS: Imaging confirms needle placed into the right liver lesion adjacent to the gallbladder for core biopsy IMPRESSION: Successful ultrasound right liver lesion 18 gauge core biopsy Electronically Signed   By: Jerilynn Mages.  Shick M.D.   On: 05/15/2020 15:56   DG Foot Complete Left  Result Date: 05/17/2020 CLINICAL DATA:  Initial evaluation for acute trauma, fall. EXAM: LEFT FOOT - COMPLETE 3+ VIEW COMPARISON:  None. FINDINGS: No acute fracture or dislocation. Joint spaces maintained. Mild soft tissue swelling about the midfoot and ankle. 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 IMPRESSION: 1. No acute fracture or dislocation. 2. Mild soft tissue swelling about the midfoot and ankle. Electronically Signed   By: Pincus Badder.D.  On: 05/17/2020 04:25   DG Hip Unilat W or Wo Pelvis 2-3 Views Left  Result Date: 05/17/2020 CLINICAL DATA:  Initial evaluation for acute trauma, fall. EXAM: DG HIP (WITH OR WITHOUT PELVIS) 2-3V LEFT COMPARISON:  None. FINDINGS: Acute fracture extends through the left femoral neck with slight superior subluxation. Femoral head remains in normal alignment with the acetabulum. Acetabulum itself appears intact. Bony pelvis intact. Limited views of the right hip demonstrate no acute finding. Underlying osteopenia. Scattered vascular calcifications noted about the inguinal regions bilaterally. IMPRESSION: Acute mildly displaced fracture of the left femoral neck. Electronically Signed   By: Jeannine Boga M.D.   On: 05/17/2020 04:22    DVT Prophylaxis -SCD /Heparin AM Labs Ordered, also please review Full Orders  Family Communication: Admission, patients condition and plan of care including tests being ordered have  been discussed with the patient and brother who indicate understanding and agree with the plan   Code Status - Full Code  Likely DC to  SNF rehab after hip surgery  Condition   stable  Roxan Hockey M.D on 05/17/2020 at 11:00 AM Go to [email or web address] -  for contact info  [facility] - Office  [phone]

## 2020-05-17 NOTE — Progress Notes (Signed)
To MRI

## 2020-05-17 NOTE — ED Provider Notes (Signed)
[facility] EMERGENCY DEPARTMENT Provider Note   CSN: [id number] Arrival date & time: 05/17/20  0028   Time seen 2:25 AM  History Chief Complaint  Patient presents with   Lytle Michaels    WYETH HOFFER is a 69 y.o. male.  HPI   Patient states he has stage IV cancer, he thinks it may be lung cancer.  He was discharged from the hospital on November 5 at about 5:15 PM.  He moved in with his brother and patient is legally blind from [employer].  He states he thought he had learned where the bathroom was which was just outside of his bedroom door.  However he evidently went the wrong way and he fell down about 4 steps of stairs.  He hit his head and has pain to his left wrist, left hip, and left ankle and foot.  He denies loss of consciousness or neck pain.  Patient is left-handed.  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 (Pleural/Chest/Abdomen/Liver)/Non-Small Cell Carcinoma)  05/16/2020   Palliative care by specialist    Goals of care, counseling/discussion    Malnutrition of moderate degree 05/12/2020   Failure to thrive in adult    Intraabdominal mass    Generalized abdominal pain 05/11/2020   Abnormal weight loss 05/11/2020   Multiple sclerosis (HCC)    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 No pertinent surgical history.     History reviewed. No pertinent family history.  Social History   Tobacco Use   Smoking status: Never Smoker   Smokeless tobacco: Never Used  Vaping Use   Vaping Use: Never used  Substance Use Topics   Alcohol use: Not Currently   Drug use: Not Currently    Home Medications Prior to Admission medications   Medication Sig Start Date End Date Taking? Authorizing Provider  feeding supplement (ENSURE ENLIVE / ENSURE PLUS) LIQD Take 237 mLs by mouth 3 (three) times daily. 05/16/20   Roxan Hockey, MD  HYDROcodone-acetaminophen (NORCO) 10-325 MG tablet Take 1-2 tablets by mouth every 6 (six) hours as needed for severe pain. 05/16/20   Roxan Hockey, MD  LORazepam (ATIVAN) 1 MG tablet Take 1 tablet (1 mg total) by mouth every 8 (eight) hours as needed for anxiety. 05/16/20   Roxan Hockey, MD  multivitamin-lutein (OCUVITE-LUTEIN) CAPS capsule Take 1 capsule by mouth daily. 05/17/20   Roxan Hockey, MD  ondansetron (ZOFRAN) 4 MG tablet Take 1 tablet (4 mg total) by mouth every 6 (six) hours as needed for nausea. 05/16/20   Roxan Hockey, MD  pantoprazole (PROTONIX) 40 MG tablet Take 1 tablet (40 mg total) by  mouth daily. 05/17/20   Roxan Hockey, MD  polyethylene glycol (MIRALAX / GLYCOLAX) 17 g packet Take 17 g by mouth daily. 05/17/20   Roxan Hockey, MD  senna-docusate (SENOKOT-S) 8.6-50 MG tablet Take 2 tablets by mouth at bedtime. 05/16/20 05/16/21  Roxan Hockey, MD  traZODone (DESYREL) 100 MG tablet Take 0.5-1 tablets (50-100 mg total) by mouth at bedtime. 05/16/20   Roxan Hockey, MD    Allergies    Demerol [meperidine hcl]  Review of Systems   Review of Systems  All other systems reviewed and are negative.   Physical Exam Updated Vital Signs BP (!) 146/71 (BP Location: Right Arm)    Pulse 96    Temp 98.2 F (36.8 C) (Oral)    Resp 17    Ht 5\' 9"  (1.753 m)    Wt 68 kg    SpO2 100%    BMI 22.14 kg/m    Physical Exam Vitals and nursing note reviewed.  Constitutional:      General: He is not in acute distress.    Appearance: Normal appearance. He is normal weight.  HENT:     Head: Normocephalic.     Comments: Patient has a very small abrasion of his left forehead near his eyebrow    Nose: Nose normal.  Cardiovascular:     Rate and Rhythm: Normal rate and regular rhythm.     Pulses: Normal pulses.     Heart sounds: Normal heart sounds.  Pulmonary:     Effort: Pulmonary effort is normal. No respiratory distress.     Breath sounds: Normal breath sounds.  Chest:     Chest wall: No tenderness.  Musculoskeletal:     Cervical back: Normal range of motion and neck supple. No tenderness.     Comments: Patient is nontender to palpation of the left clavicle, left elbow, left shoulder.  He does have some swelling and deformity noted of his left wrist.  He has good distal pulses.  On exam of his left lower extremity there is some shortening of his left lower leg however he does have a pillow underneath his left knee.  He is nontender in his left knee.  He has some swelling and tenderness over the lateral malleolus of the left ankle and the lateral proximal aspect of the left foot.  He has excellent dorsalis pedis pulses.  Skin:    General: Skin is warm and dry.  Neurological:     General: No focal deficit present.     Mental Status: He is alert and oriented to person, place, and time.     Cranial Nerves: No cranial nerve deficit.  Psychiatric:        Mood and Affect: Mood normal.        Behavior: Behavior normal.        Thought Content: Thought content normal.     ED Results / Procedures / Treatments   Labs (all labs ordered are listed, but only abnormal results are displayed) Results for orders placed or performed during the hospital encounter of 05/17/20  Comprehensive metabolic panel  Result Value Ref Range   Sodium 134 (L) 135 - 145 mmol/L   Potassium 3.8 3.5 - 5.1 mmol/L    Chloride 98 98 - 111 mmol/L   CO2 25 22 - 32 mmol/L   Glucose, Bld 142 (H) 70 - 99 mg/dL   BUN 14 8 - 23 mg/dL   Creatinine, Ser 0.54 (L) 0.61 - 1.24 mg/dL   Calcium 9.5 8.9 -  10.3 mg/dL   Total Protein 6.6 6.5 - 8.1 g/dL   Albumin 3.2 (L) 3.5 - 5.0 g/dL   AST 43 (H) 15 - 41 U/L   ALT 64 (H) 0 - 44 U/L   Alkaline Phosphatase 120 38 - 126 U/L   Total Bilirubin 0.5 0.3 - 1.2 mg/dL   GFR, Estimated >60 >60 mL/min   Anion gap 11 5 - 15  CBC with Differential  Result Value Ref Range   WBC 16.8 (H) 4.0 - 10.5 K/uL   RBC 3.71 (L) 4.22 - 5.81 MIL/uL   Hemoglobin 12.0 (L) 13.0 - 17.0 g/dL   HCT 37.9 (L) 39 - 52 %   MCV 102.2 (H) 80.0 - 100.0 fL   MCH 32.3 26.0 - 34.0 pg   MCHC 31.7 30.0 - 36.0 g/dL   RDW 12.3 11.5 - 15.5 %   Platelets 331 150 - 400 K/uL   nRBC 0.0 0.0 - 0.2 %   Neutrophils Relative % 89 %   Neutro Abs 14.9 (H) 1.7 - 7.7 K/uL   Lymphocytes Relative 5 %   Lymphs Abs 0.9 0.7 - 4.0 K/uL   Monocytes Relative 5 %   Monocytes Absolute 0.9 0.1 - 1.0 K/uL   Eosinophils Relative 0 %   Eosinophils Absolute 0.0 0.0 - 0.5 K/uL   Basophils Relative 0 %   Basophils Absolute 0.0 0.0 - 0.1 K/uL   Immature Granulocytes 1 %   Abs Immature Granulocytes 0.11 (H) 0.00 - 0.07 K/uL  Protime-INR  Result Value Ref Range   Prothrombin Time 13.7 11.4 - 15.2 seconds   INR 1.1 0.8 - 1.2  APTT  Result Value Ref Range   aPTT 27 24 - 36 seconds   Laboratory interpretation all normal except leukocytosis, anemia, minor elevation of LFTs    EKG EKG Interpretation  Date/Time:  Saturday May 17 2020 00:50:44 EDT Ventricular Rate:  103 PR Interval:  160 QRS Duration: 54 QT Interval:  310 QTC Calculation: 406 R Axis:   86 Text Interpretation: Sinus tachycardia Septal infarct , age undetermined Electrode noise No old tracing to compare Confirmed by Rolland Porter [phone]) on 05/17/2020 1:32:06 AM   Radiology DG Chest 1 View  Result Date: 05/17/2020 CLINICAL DATA:  Initial evaluation  for acute trauma, fall. EXAM: CHEST  1 VIEW COMPARISON:  Prior radiograph from 05/11/2020. FINDINGS: Cardiac and mediastinal silhouettes are stable in size and contour, and may within normal limits. Aortic atherosclerosis. Lungs are hyperinflated with underlying emphysematous changes. No focal infiltrates. No edema or effusion. No pneumothorax. No acute osseous finding. IMPRESSION: 1. No active cardiopulmonary disease. 2. Aortic Atherosclerosis (ICD10-I70.0) and Emphysema (ICD10-J43.9). Electronically Signed   By: Jeannine Boga M.D.   On: 05/17/2020 04:27   DG Wrist Complete Left  Result Date: 05/17/2020 CLINICAL DATA:  Initial evaluation for acute pain and swelling, fall downstairs. EXAM: LEFT WRIST - COMPLETE 3+ VIEW COMPARISON:  None. FINDINGS: Acute comminuted fracture of the distal right radius with slight impaction and dorsal angulation. Associated intra-articular extension. Distal ulna intact. Diffuse soft tissue swelling present about the wrist. IMPRESSION: Acute comminuted fracture of the distal right radius with intra-articular extension and dorsal angulation. Electronically Signed   By: Jeannine Boga M.D.   On: 05/17/2020 04:19   DG Ankle Complete Left  Result Date: 05/17/2020 CLINICAL DATA:  Initial evaluation for acute trauma, fall. EXAM: LEFT ANKLE COMPLETE - 3+ VIEW COMPARISON:  None. FINDINGS: No acute fracture dislocation. Ankle mortise approximated. Corticated osseous density  at the medial malleolus consistent with a chronic finding. Diffuse soft tissue swelling present about the ankle. Underlying osteoarthritic changes noted. IMPRESSION: 1. No acute fracture or dislocation. 2. Diffuse soft tissue swelling about the ankle. Electronically Signed   By: Jeannine Boga M.D.   On: 05/17/2020 04:21   CT Head Wo Contrast  Result Date: 05/17/2020 CLINICAL DATA:  Status post trauma.  Fall. EXAM: CT HEAD WITHOUT CONTRAST TECHNIQUE: Contiguous axial images were obtained from  the base of the skull through the vertex without intravenous contrast. COMPARISON:  Brain MRI 05/12/2020 FINDINGS: Brain: No evidence of acute infarction, hemorrhage, hydrocephalus, extra-axial collection or mass lesion/mass effect. There is mild diffuse low-attenuation within the subcortical and periventricular white matter compatible with chronic microvascular disease. Prominence of the sulci and ventricles compatible with mild brain atrophy. Vascular: No hyperdense vessel or unexpected calcification. Skull: Normal. Negative for fracture or focal lesion. Sinuses/Orbits: No acute finding. Other: None. IMPRESSION: 1. No acute intracranial abnormalities. 2. Chronic small vessel ischemic change and brain atrophy. Electronically Signed   By: Kerby Moors M.D.   On: 05/17/2020 04:10   US BIOPSY (LIVER)  Result Date: 05/15/2020 INDICATION: Right hilar mass, liver lesion adjacent to the gallbladder EXAM: ULTRASOUND GUIDED CORE BIOPSY OF RIGHT LIVER LESION . FINDINGS: Imaging confirms needle placed into the right liver lesion adjacent to the gallbladder for core biopsy IMPRESSION: Successful ultrasound right liver lesion 18 gauge core biopsy Electronically Signed   By: Jerilynn Mages.  Shick M.D.   On: 05/15/2020 15:56   DG Foot Complete Left  Result Date: 05/17/2020 CLINICAL DATA:  Initial evaluation for acute trauma, fall. EXAM: LEFT FOOT - COMPLETE 3+ VIEW COMPARISON:  None. FINDINGS: No acute fracture or dislocation. Joint spaces maintained. Mild soft tissue swelling about the midfoot and ankle. 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 IMPRESSION: 1. No acute fracture or dislocation. 2. Mild soft tissue swelling about the midfoot and ankle. Electronically Signed   By: Jeannine Boga M.D.   On: 05/17/2020 04:25   DG Hip Unilat W or Wo Pelvis 2-3 Views Left  Result Date: 05/17/2020 CLINICAL DATA:  Initial evaluation for acute trauma, fall. EXAM: DG HIP (WITH OR WITHOUT PELVIS) 2-3V LEFT COMPARISON:   None. FINDINGS: Acute fracture extends through the left femoral neck with slight superior subluxation. Femoral head remains in normal alignment with the acetabulum. Acetabulum itself appears intact. Bony pelvis intact. Limited views of the right hip demonstrate no acute finding. Underlying osteopenia. Scattered vascular calcifications noted about the inguinal regions bilaterally. IMPRESSION: Acute mildly displaced fracture of the left femoral neck. Electronically Signed   By: Jeannine Boga M.D.   On: 05/17/2020 04:22    Procedures Procedures (including critical care time)  Medications Ordered in ED Medications  fentaNYL (SUBLIMAZE) injection 50 mcg (50 mcg Intravenous Given 05/17/20 0249)    ED Course  I have reviewed the triage vital signs and the nursing notes.  Pertinent labs & imaging results that were available during my care of the patient were reviewed by me and considered in my medical decision making (see chart for details).    MDM Rules/Calculators/A&P                          Patient was given IV pain medication and x-rays were CT scan was ordered of the injured areas.  I have talked to the patient that he does have a fracture of his wrist and his left hip.  He  states he does not have an orthopedist.  Laboratory testing was done so that patient could talk to the orthopedist about surgery.  Unfortunately our operating room is closed this weekend at [facility].  He will need to be transferred to [facility].  6:07 AM Dr. Stann Mainland, orthopedics.  States to have the hospitalist admit to [facility].  When he gets there they can call the orthopedist on-call.  6:30 AM Dr. Josephine Cables, hospitalist will get patient admitted.  Final Clinical Impression(s) / ED Diagnoses Final diagnoses:  Fall in home, initial encounter  Closed fracture of neck of left femur, initial encounter ([place])  Other closed intra-articular fracture of distal end of left radius, initial encounter  Contusion of  other part of head, initial encounter    Rx / DC Orders  Plan admission  Rolland Porter, MD, Barbette Or, MD 05/17/20 [id number]

## 2020-05-17 NOTE — Progress Notes (Signed)
Palliative consult in progress

## 2020-05-17 NOTE — ED Notes (Signed)
Dr. Tomi Bamberger to bedside.

## 2020-05-17 NOTE — Plan of Care (Signed)

## 2020-05-17 NOTE — Consult Note (Signed)
Palliative Medicine   Name: Brandon Castillo Date: 05/17/2020 MRN: [id number]  DOB: 04/10/1951  Patient Care Team: Patient, No Pcp Per as PCP - General (General Practice)    REASON FOR CONSULTATION: Brandon Castillo is a 69 y.o. 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  CT of the abdomen revealed bilateral renal masses, liver nodule, bilateral adrenal nodules.  CT of the chest revealed a right upper lobe mass suspicious for primary lung neoplasm.  Patient underwent liver biopsy with pathology revealing squamous cell carcinoma.  Patient was discharged home on 11/5 with plan for outpatient oncology follow-up.  Unfortunately, after returning home, he fell down several steps and was readmitted to the hospital with fractures to the left hip and left wrist.  Palliative care was consulted to address goals.  SOCIAL HISTORY:     reports that he has never smoked. He has never used smokeless tobacco. He reports previous alcohol use. He reports previous drug use.  Patient was never married and had no children.  He lived alone.  He has a brother who is very involved in his care.  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  ADVANCE DIRECTIVES:  Does not have  CODE STATUS: DNR  PAST MEDICAL HISTORY: Past Medical History:  Diagnosis Date  . Cancer [facility])    Diagnosed yesterday  . Multiple sclerosis ([place])     PAST SURGICAL HISTORY: History reviewed. No pertinent surgical history.  HEMATOLOGY/ONCOLOGY HISTORY:  Oncology History   No history exists.    ALLERGIES:  is allergic to demerol [meperidine hcl].  MEDICATIONS:  Current Facility-Administered Medications  Medication Dose Route Frequency Provider Last Rate Last Admin  . 0.9 %  sodium chloride infusion  250 mL Intravenous PRN Emokpae,  Courage, MD      . acetaminophen (TYLENOL) tablet 650 mg  650 mg Oral Q6H PRN Emokpae, Courage, MD       Or  . acetaminophen (TYLENOL) suppository 650 mg  650 mg Rectal Q6H PRN Emokpae, Courage, MD      . cefTRIAXone (ROCEPHIN) 1 g in sodium chloride 0.9 % 100 mL IVPB  1 g Intravenous Q24H Roxan Hockey, MD   Stopped at 05/17/20 0939  . dextrose 5 %-0.45 % sodium chloride infusion   Intravenous Continuous Emokpae, Courage, MD      . feeding supplement (ENSURE ENLIVE / ENSURE PLUS) liquid 237 mL  237 mL Oral TID Emokpae, Courage, MD      . fentaNYL (SUBLIMAZE) injection 25-50 mcg  25-50 mcg Intravenous Q2H PRN Emokpae, Courage, MD      . Derrill Memo ON 05/19/2020] heparin injection 5,000 Units  5,000 Units Subcutaneous Q8H Emokpae, Courage, MD      . LORazepam (ATIVAN) tablet 1 mg  1 mg Oral Q8H PRN Emokpae, Courage, MD      . methocarbamol (ROBAXIN) tablet 500 mg  500 mg Oral QID Emokpae, Courage, MD   500 mg at 05/17/20 1428  . [START ON 05/18/2020] multivitamin with minerals tablet 1 tablet  1 tablet Oral Daily Esmond Plants, RPH      . ondansetron [facility]) tablet 4 mg  4 mg Oral Q6H PRN Emokpae, Courage, MD       Or  . ondansetron (ZOFRAN) injection 4 mg  4 mg Intravenous Q6H PRN Roxan Hockey, MD      .  oxyCODONE (Oxy IR/ROXICODONE) immediate release tablet 10 mg  10 mg Oral Q4H PRN Denton Brick, Courage, MD   10 mg at 05/17/20 1428  . pantoprazole (PROTONIX) EC tablet 40 mg  40 mg Oral Daily Emokpae, Courage, MD      . polyethylene glycol (MIRALAX / GLYCOLAX) packet 17 g  17 g Oral Daily Emokpae, Courage, MD      . senna-docusate (Senokot-S) tablet 2 tablet  2 tablet Oral BID Emokpae, Courage, MD      . sertraline (ZOLOFT) tablet 50 mg  50 mg Oral Daily Emokpae, Courage, MD      . sodium chloride flush (NS) 0.9 % injection 3 mL  3 mL Intravenous Q12H Emokpae, Courage, MD      . sodium chloride flush (NS) 0.9 % injection 3 mL  3 mL Intravenous Q12H Emokpae, Courage, MD      . sodium chloride  flush (NS) 0.9 % injection 3 mL  3 mL Intravenous PRN Emokpae, Courage, MD      . traZODone (DESYREL) tablet 50 mg  50 mg Oral QHS Emokpae, Courage, MD        VITAL SIGNS: BP (!) 147/82 (BP Location: Right Arm)   Pulse (!) 103   Temp 98.8 F (37.1 C) (Oral)   Resp 16   Ht 5' 9"  (1.753 m)   Wt 149 lb 14.6 oz (68 kg)   SpO2 97%   BMI 22.14 kg/m  Filed Weights   05/17/20 0041  Weight: 149 lb 14.6 oz (68 kg)    Estimated body mass index is 22.14 kg/m as calculated from the following:   Height as of this encounter: 5' 9"  (1.753 m).   Weight as of this encounter: 149 lb 14.6 oz (68 kg).  LABS: CBC:    Component Value Date/Time   WBC 16.8 (H) 05/17/2020 0446   HGB 12.0 (L) 05/17/2020 0446   HCT 37.9 (L) 05/17/2020 0446   PLT 331 05/17/2020 0446   MCV 102.2 (H) 05/17/2020 0446   NEUTROABS 14.9 (H) 05/17/2020 0446   LYMPHSABS 0.9 05/17/2020 0446   MONOABS 0.9 05/17/2020 0446   EOSABS 0.0 05/17/2020 0446   BASOSABS 0.0 05/17/2020 0446   Comprehensive Metabolic Panel:    Component Value Date/Time   NA 134 (L) 05/17/2020 0446   K 3.8 05/17/2020 0446   CL 98 05/17/2020 0446   CO2 25 05/17/2020 0446   BUN 14 05/17/2020 0446   CREATININE 0.54 (L) 05/17/2020 0446   GLUCOSE 142 (H) 05/17/2020 0446   CALCIUM 9.5 05/17/2020 0446   AST 43 (H) 05/17/2020 0446   ALT 64 (H) 05/17/2020 0446   ALKPHOS 120 05/17/2020 0446   BILITOT 0.5 05/17/2020 0446   PROT 6.6 05/17/2020 0446   ALBUMIN 3.2 (L) 05/17/2020 0446    RADIOGRAPHIC STUDIES: DG Chest 1 View  Result Date: 05/17/2020 CLINICAL DATA:  Initial evaluation for acute trauma, fall. EXAM: CHEST  1 VIEW COMPARISON:  Prior radiograph from 05/11/2020. FINDINGS: Cardiac and mediastinal silhouettes are stable in size and contour, and may within normal limits. Aortic atherosclerosis. Lungs are hyperinflated with underlying emphysematous changes. No focal infiltrates. No edema or effusion. No pneumothorax. No acute osseous finding.  IMPRESSION: 1. No active cardiopulmonary disease. 2. Aortic Atherosclerosis (ICD10-I70.0) and Emphysema (ICD10-J43.9). Electronically Signed   By: Jeannine Boga M.D.   On: 05/17/2020 04:27   DG Chest 2 View  Result Date: 05/11/2020 CLINICAL DATA:  Chest pain. EXAM: CHEST - 2 VIEW COMPARISON:  None. FINDINGS: The  heart is normal in size. Mild tortuosity and calcification of the thoracic aorta. Slight ill-defined density in the right suprahilar/right upper lobe region. This is indeterminate finding but chest CT suggested to further evaluate. Lungs demonstrate hyperinflation and emphysematous changes. No pleural effusions. IMPRESSION: 1. Ill-defined right suprahilar/right upper lobe density. Chest CT suggested for further evaluate. 2. Underlying emphysematous changes. Electronically Signed   By: Marijo Sanes M.D.   On: 05/11/2020 13:26   DG Wrist Complete Left  Result Date: 05/17/2020 CLINICAL DATA:  Initial evaluation for acute pain and swelling, fall downstairs. EXAM: LEFT WRIST - COMPLETE 3+ VIEW COMPARISON:  None. FINDINGS: Acute comminuted fracture of the distal right radius with slight impaction and dorsal angulation. Associated intra-articular extension. Distal ulna intact. Diffuse soft tissue swelling present about the wrist. IMPRESSION: Acute comminuted fracture of the distal right radius with intra-articular extension and dorsal angulation. Electronically Signed   By: Jeannine Boga M.D.   On: 05/17/2020 04:19   DG Ankle Complete Left  Result Date: 05/17/2020 CLINICAL DATA:  Initial evaluation for acute trauma, fall. EXAM: LEFT ANKLE COMPLETE - 3+ VIEW COMPARISON:  None. FINDINGS: No acute fracture dislocation. Ankle mortise approximated. Corticated osseous density at the medial malleolus consistent with a chronic finding. Diffuse soft tissue swelling present about the ankle. Underlying osteoarthritic changes noted. IMPRESSION: 1. No acute fracture or dislocation. 2. Diffuse soft  tissue swelling about the ankle. Electronically Signed   By: Jeannine Boga M.D.   On: 05/17/2020 04:21   CT Head Wo Contrast  Result Date: 05/17/2020 CLINICAL DATA:  Status post trauma.  Fall. EXAM: CT HEAD WITHOUT CONTRAST TECHNIQUE: Contiguous axial images were obtained from the base of the skull through the vertex without intravenous contrast. COMPARISON:  Brain MRI 05/12/2020 FINDINGS: Brain: No evidence of acute infarction, hemorrhage, hydrocephalus, extra-axial collection or mass lesion/mass effect. There is mild diffuse low-attenuation within the subcortical and periventricular white matter compatible with chronic microvascular disease. Prominence of the sulci and ventricles compatible with mild brain atrophy. Vascular: No hyperdense vessel or unexpected calcification. Skull: Normal. Negative for fracture or focal lesion. Sinuses/Orbits: No acute finding. Other: None. IMPRESSION: 1. No acute intracranial abnormalities. 2. Chronic small vessel ischemic change and brain atrophy. Electronically Signed   By: Kerby Moors M.D.   On: 05/17/2020 04:10   CT Chest Wo Contrast  Result Date: 05/11/2020 CLINICAL DATA:  Abnormal chest x-ray. EXAM: CT CHEST WITHOUT CONTRAST TECHNIQUE: Multidetector CT imaging of the chest was performed following the standard protocol without IV contrast. COMPARISON:  Chest x-ray, same date. FINDINGS: Cardiovascular: The heart is normal in size. No pericardial effusion. Mild tortuosity and ectasia of the thoracic aorta with scattered atherosclerotic calcifications. Scattered coronary artery calcifications. Mediastinum/Nodes: Right upper lobe lung mass is invading the right hilum and right mediastinum. I do not see any discrete measurable mediastinal lymph nodes. The esophagus is grossly normal. Lungs/Pleura: 3.2 cm spiculated right upper lobe lung mass medially invading the right hilum and the right mediastinum. Findings consistent with primary lung neoplasm. Advanced  emphysematous changes and areas of pulmonary scarring. There are several scattered subpleural pulmonary nodules noted which are indeterminate. None of these measures more than 5 mm. Metastatic disease is certainly a consideration. No acute pulmonary findings.  No pleural effusions. Upper Abdomen: Bilateral adrenal gland nodules and upper pole right renal lesion as seen on today's abdominal/pelvic CT scan. Musculoskeletal: No worrisome bone lesions to suggest metastatic bone disease. IMPRESSION: 1. 3.2 cm spiculated right upper lobe lung mass invading  the right hilum and right mediastinum consistent with primary lung neoplasm. 2. Several scattered subpleural pulmonary nodules are indeterminate. Metastatic disease is certainly a consideration. 3. Advanced emphysematous changes and pulmonary scarring. 4. Bilateral adrenal gland nodules and upper pole right renal lesion as seen on today's abdominal/pelvic CT scan. 5. No findings for osseous metastatic disease. 6. Emphysema and aortic atherosclerosis. Aortic Atherosclerosis (ICD10-I70.0) and Emphysema (ICD10-J43.9). Electronically Signed   By: Marijo Sanes M.D.   On: 05/11/2020 14:26   MR BRAIN W WO CONTRAST  Result Date: 05/12/2020 CLINICAL DATA:  Lung mass EXAM: MRI HEAD WITHOUT AND WITH CONTRAST TECHNIQUE: Multiplanar, multiecho pulse sequences of the brain and surrounding structures were obtained without and with intravenous contrast. CONTRAST:  5m GADAVIST GADOBUTROL 1 MMOL/ML IV SOLN COMPARISON:  None. FINDINGS: Brain: There is no acute infarction or intracranial hemorrhage. There is no intracranial mass, mass effect, or edema. There is no hydrocephalus or extra-axial fluid collection. Prominence of the ventricles and sulci reflects minor generalized parenchymal volume loss. There is an incidental small right frontal developmental venous anomaly. 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 Patchy T2 hyperintensity in  the supratentorial white matter is nonspecific but may reflect minor chronic microvascular ischemic changes. Vascular: Major vessel flow voids at the skull base are preserved. Skull and upper cervical spine: Normal marrow signal is preserved. Sinuses/Orbits: Paranasal sinuses are aerated. Orbits are unremarkable. Other: Sella is unremarkable.  Mastoid air cells are clear. IMPRESSION: No evidence of intracranial metastatic disease. Chronic/nonemergent findings detailed above. Electronically Signed   By: PMacy MisM.D.   On: 05/12/2020 10:19   MR ABDOMEN W WO CONTRAST  Result Date: 05/14/2020 CLINICAL DATA:  Evaluate for liver metastases. EXAM: MRI ABDOMEN WITHOUT AND WITH CONTRAST TECHNIQUE: Multiplanar multisequence MR imaging of the abdomen was performed both before and after the administration of intravenous contrast. CONTRAST:  776mGADAVIST GADOBUTROL 1 MMOL/ML IV SOLN COMPARISON:  CT AP 05/11/2020 FINDINGS: Lower chest: No acute findings. Hepatobiliary: Within segment 5 there is a T1 hypointense and mildly T2 hyperintense lesion adjacent to the gallbladder fossa which shows peripheral enhancement consistent with a metastatic lesion. This measures approximately 2.1 x 1.7 cm, image 49/16. Within the subcapsular aspect of the posterior right hepatic lobe there is a 1.3 x 0.7 cm T2 hyperintense and T1 hypointense structure without corresponding enhancement which is favored to represent a benign abnormality. The gallbladder is unremarkable. No bile duct dilatation identified. Pancreas: No mass, inflammatory changes, or other parenchymal abnormality identified. Spleen:  Within normal limits in size and appearance. Adrenals/Urinary Tract:  Normal appearance of the adrenal glands. Again seen are multiple peripherally enhancing right kidney lesions. The dominant lesion arises from the inferior pole of the right kidney measuring 5.1 x 5.4 cm, image 65/6. This is T1 hypointense, T2 isointense with mild peripheral  enhancement. Similarly, arising from the medial cortex of the upper pole there is a irregular mass measuring 3.4 x 2.5 cm which is T2 isointense, T1 hypointense, with peripheral enhancement. This lesion also contains a thin internal area of linear enhancement, image 48/16. Also arising from the upper pole of the right kidney is a small peripherally enhancing lesion which is T1 hypointense, teen 2 hyperintense and measures 1.9 x 1.5 cm, image 49/16. Small simple appearing cyst is identified involving the lateral cortex of the right kidney measuring 0.5 cm, image 18/4. 2 distinct lesions are noted arising from the upper pole of left kidney which have similar signal and enhancement characteristics to  the suspicious right kidney lesions, image 34/18. The largest of these measures 1.5 cm. Stomach/Bowel: Visualized portions within the abdomen are unremarkable. Vascular/Lymphatic: No signs of renal vein involvement bilaterally. The IVC is patent without evidence for tumor thrombus. Aortic atherosclerosis. No aneurysm. No abdominal adenopathy. Other: No ascites or focal fluid collections identified within the imaged portions of the upper abdomen. Musculoskeletal: No suspicious bone lesions identified. IMPRESSION: 1. Again seen are multiple, bilateral peripherally enhancing kidney lesions. In the setting of a suspicious lung mass, the multiplicity, signal and enhancement characteristics of these lesions favor metastatic disease. Differential considerations include multiple cystic renal cell carcinomas versus multiple liver metastases. 2. Liver lesion adjacent to the gallbladder fossa has signal and enhancement characteristics favoring a metastatic lesion. Electronically Signed   By: Kerby Moors M.D.   On: 05/14/2020 05:11   CT Abdomen Pelvis W Contrast  Addendum Date: 05/11/2020   ADDENDUM REPORT: 05/11/2020 13:43 ADDENDUM: Of note, an alternative differential consideration for the bilateral renal masses is renal  abscesses. The hypoechoic mass in the liver could represent a benign hemangioma, and MR abdomen according could be considered for further evaluation as part of the patient's workup. These results were called by telephone at the time of interpretation on 05/11/2020 at 1:42 pm to provider [facility] , who verbally acknowledged these results. Electronically Signed   By: Zerita Boers M.D.   On: 05/11/2020 13:43   Result Date: 05/11/2020 CLINICAL DATA:  Lower abdominal pain and generalized weakness for 2 weeks. EXAM: CT ABDOMEN AND PELVIS WITH CONTRAST TECHNIQUE: Multidetector CT imaging of the abdomen and pelvis was performed using the standard protocol following bolus administration of intravenous contrast. CONTRAST:  46m OMNIPAQUE IOHEXOL 300 MG/ML  SOLN COMPARISON:  Report from abdominal radiograph dated 03/05/2002. FINDINGS: Lower chest: No acute abnormality. Hepatobiliary: There is a 1.6 cm hypoattenuating mass in hepatic segment 5/4B near the gallbladder fossa. No gallstones, gallbladder wall thickening, or biliary dilatation. Pancreas: Unremarkable. No pancreatic ductal dilatation or surrounding inflammatory changes. Spleen: Normal in size without focal abnormality. Adrenals/Urinary Tract: Two left adrenal nodules measure approximately 0.8 and 0.9 cm (series 5, image 49 and image 46). The right adrenal gland appears normal. Multiple indistinct hypoechoic masses enlarged the right kidney with the largest measuring 6.2 cm in diameter. A mass is seen in the right renal pelvis, measuring 2.9 cm (series 7, image 16). At least two hypoechoic masses are seen in the left kidney, measuring up to 1.7 cm. A 7 mm mass in the lateral right kidney likely represents a benign cyst (series 2, image 33). There is no hydronephrosis. Bladder is unremarkable. Stomach/Bowel: Stomach is within normal limits. Appendix appears normal. There is colonic diverticulosis without evidence of diverticulitis. No evidence of bowel wall  thickening, distention, or inflammatory changes. Vascular/Lymphatic: Aortic atherosclerosis. The inferior vena cava and both renal veins appear normal. No enlarged abdominal or pelvic lymph nodes. Reproductive: The prostate is mildly enlarged, measuring 5.1 cm in transverse dimension Other: No abdominal wall hernia or abnormality. No abdominopelvic ascites. Musculoskeletal: Degenerative changes are seen in the spine. IMPRESSION: 1. Multiple hypoechoic masses in both kidneys as well as a mass in the right renal pelvis. Differential considerations include renal cell carcinoma, transitional cell carcinoma, renal lymphoma, and metastatic disease. Indeterminate hypoattenuating mass in the liver and two left adrenal nodules may represent metastatic disease. CT abdomen pelvis according to a renal protocol could be considered for further evaluation. Aortic Atherosclerosis (ICD10-I70.0). Electronically Signed: By: TZerita BoersM.D. On: 05/11/2020  12:28   US BIOPSY (LIVER)  Result Date: 05/15/2020 INDICATION: Right hilar mass, liver lesion adjacent to the gallbladder EXAM: ULTRASOUND GUIDED CORE BIOPSY OF RIGHT LIVER LESION MEDICATIONS: 1% LIDOCAINE LOCAL ANESTHESIA/SEDATION: Fentanyl 50 mcg IV; Versed 1.0 mg IV Moderate Sedation Time:  10 MINUTES The patient was continuously monitored during the procedure by the interventional radiology nurse under my direct supervision. PROCEDURE: The procedure, risks, benefits, and alternatives were explained to the patient. Questions regarding the procedure were encouraged and answered. The patient understands and consents to the procedure. previous imaging reviewed. preliminary ultrasound performed. the solid hypoechoic lesion adjacent to the gallbladder in the right lobe was localized and marked. this is well below the subcostal margin. Under sterile conditions and local anesthesia, a 17 gauge 6.8 cm access was advanced to the lesion under direct ultrasound. Needle position  confirmed with ultrasound. 3 18 gauge core biopsies obtained of the lesion under direct ultrasound. Samples were intact and non fragmented. These were placed in formalin. Needle tract occluded with Gel-Foam. Postprocedure imaging demonstrates no hemorrhage or hematoma. Patient tolerated biopsy well. COMPLICATIONS: None immediate. FINDINGS: Imaging confirms needle placed into the right liver lesion adjacent to the gallbladder for core biopsy IMPRESSION: Successful ultrasound right liver lesion 18 gauge core biopsy Electronically Signed   By: Jerilynn Mages.  Shick M.D.   On: 05/15/2020 15:56   US Venous Img Lower Bilateral (DVT)  Result Date: 05/12/2020 CLINICAL DATA:  Bilateral lower extremity pain.  Evaluate for DVT. EXAM: BILATERAL LOWER EXTREMITY VENOUS DOPPLER ULTRASOUND TECHNIQUE: Gray-scale sonography with graded compression, as well as color Doppler and duplex ultrasound were performed to evaluate the lower extremity deep venous systems from the level of the common femoral vein and including the common femoral, femoral, profunda femoral, popliteal and calf veins including the posterior tibial, peroneal and gastrocnemius veins when visible. The superficial great saphenous vein was also interrogated. Spectral Doppler was utilized to evaluate flow at rest and with distal augmentation maneuvers in the common femoral, femoral and popliteal veins. COMPARISON:  None. FINDINGS: RIGHT LOWER EXTREMITY Common Femoral Vein: No evidence of thrombus. Normal compressibility, respiratory phasicity and response to augmentation. Saphenofemoral Junction: No evidence of thrombus. Normal compressibility and flow on color Doppler imaging. Profunda Femoral Vein: No evidence of thrombus. Normal compressibility and flow on color Doppler imaging. Femoral Vein: No evidence of thrombus. Normal compressibility, respiratory phasicity and response to augmentation. Popliteal Vein: No evidence of thrombus. Normal compressibility, respiratory  phasicity and response to augmentation. Calf Veins: No evidence of thrombus. Normal compressibility and flow on color Doppler imaging. Superficial Great Saphenous Vein: No evidence of thrombus. Normal compressibility. Venous Reflux:  None. Other Findings:  None. LEFT LOWER EXTREMITY Common Femoral Vein: No evidence of thrombus. Normal compressibility, respiratory phasicity and response to augmentation. Saphenofemoral Junction: No evidence of thrombus. Normal compressibility and flow on color Doppler imaging. Profunda Femoral Vein: No evidence of thrombus. Normal compressibility and flow on color Doppler imaging. Femoral Vein: No evidence of thrombus. Normal compressibility, respiratory phasicity and response to augmentation. Popliteal Vein: No evidence of thrombus. Normal compressibility, respiratory phasicity and response to augmentation. Calf Veins: No evidence of thrombus. Normal compressibility and flow on color Doppler imaging. Superficial Great Saphenous Vein: No evidence of thrombus. Normal compressibility. Venous Reflux:  None. Other Findings:  None. IMPRESSION: No evidence of DVT within either lower extremity. Electronically Signed   By: Sandi Mariscal M.D.   On: 05/12/2020 17:38   DG Foot Complete Left  Result Date: 05/17/2020 CLINICAL DATA:  Initial evaluation for acute trauma, fall. EXAM: LEFT FOOT - COMPLETE 3+ VIEW COMPARISON:  None. FINDINGS: No acute fracture or dislocation. Joint spaces maintained. Mild soft tissue swelling about the midfoot and ankle. 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 IMPRESSION: 1. No acute fracture or dislocation. 2. Mild soft tissue swelling about the midfoot and ankle. Electronically Signed   By: Jeannine Boga M.D.   On: 05/17/2020 04:25   DG Hip Unilat W or Wo Pelvis 2-3 Views Left  Result Date: 05/17/2020 CLINICAL DATA:  Initial evaluation for acute trauma, fall. EXAM: DG HIP (WITH OR WITHOUT PELVIS) 2-3V LEFT COMPARISON:  None. FINDINGS: Acute  fracture extends through the left femoral neck with slight superior subluxation. Femoral head remains in normal alignment with the acetabulum. Acetabulum itself appears intact. Bony pelvis intact. Limited views of the right hip demonstrate no acute finding. Underlying osteopenia. Scattered vascular calcifications noted about the inguinal regions bilaterally. IMPRESSION: Acute mildly displaced fracture of the left femoral neck. Electronically Signed   By: Jeannine Boga M.D.   On: 05/17/2020 04:22    PERFORMANCE STATUS (ECOG) : 3 - Symptomatic, >50% confined to bed  Review of Systems Unless otherwise noted, a complete review of systems is negative.  Physical Exam General: NAD, frail appearing, thin Pulmonary: Unlabored Extremities: no edema, no joint deformities Skin: no rashes Neurological: Weakness but otherwise nonfocal  IMPRESSION: Met with patient to discuss goals.  Patient was actually seen by palliative care NP in the hospital prior to discharge.  At that point, plan was to pursue outpatient oncology work-up and treatment.  ACP was discussed and MOST form was reviewed.  Today, patient recognizes that his fall and fracture might pose a setback to his cancer treatment plan if his performance status declines or if he requires rehab.  However, he remains committed to "buying time" and pursuing any and all treatment for the cancer that is available.  Patient clarifies that he is not afraid to die and recognizes that the cancer is ultimately incurable.  He says that he has seen multiple friends and both of his parents die from cancer.  He says that his brother struggled with the death of both parents and that patient wishes to proceed with treatment to give his brother as much time as possible.  We discussed CODE STATUS.  Patient stated clearly that he would not want to be resuscitated or have his life prolonged artificially on machines.  I called and spoke with his brother who verbalized  being devastated regarding the recent news.  His brother was in agreement with changing CODE STATUS to DNR.  However, goals remain for full scope of treatment.  Patient talks about how his blindness has not limited him over the years.  [place] and is also developed techniques that allow him to enjoy a shooting hobby despite his visual limitations.  Patient's brother talks about how optimistic patient is regarding life.  He does not allow anything to get him down.  Symptomatically, patient reports minimal pain at present.  He denies other distressing symptoms.  PLAN: -Continue current scope of treatment -Continue aggressive pain regimen -Disposition unclear.  Patient may require rehab -DNR/DNI -Chaplain consult to help with ACP documents   Time Total: 75 minutes  Visit consisted of counseling and education dealing with the complex and emotionally intense issues of symptom management and palliative care in the setting of serious and potentially life-threatening illness.Greater than 50%  of this  time was spent counseling and coordinating care related to the above assessment and plan.  Signed by: Altha Harm, PhD, NP-C

## 2020-05-17 NOTE — ED Triage Notes (Addendum)
Pt here for fall down 4 stairs. 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 Pt with c/o pain to L. Upper arm, L. Wrist, L. Hip (felt a pop), L. Ankle, and L foot.Pt also states he hit his head above L. Eye (small abrasion noted) but denies LOC.

## 2020-05-18 ENCOUNTER — Encounter (HOSPITAL_COMMUNITY): Payer: Self-pay | Admitting: Internal Medicine

## 2020-05-18 ENCOUNTER — Inpatient Hospital Stay (HOSPITAL_COMMUNITY): Payer: Medicare HMO | Admitting: Anesthesiology

## 2020-05-18 ENCOUNTER — Inpatient Hospital Stay (HOSPITAL_COMMUNITY): Payer: Medicare HMO

## 2020-05-18 ENCOUNTER — Encounter (HOSPITAL_COMMUNITY)
Admission: EM | Disposition: A | Discharge status: Home Health | Payer: Self-pay | Source: Home / Self Care | Attending: Family Medicine

## 2020-05-18 HISTORY — PX: TOTAL HIP ARTHROPLASTY: SHX124

## 2020-05-18 LAB — CBC
HCT: 35.8 % — ABNORMAL LOW (ref 39.0–52.0)
Hemoglobin: 11.8 g/dL — ABNORMAL LOW (ref 13.0–17.0)
MCH: 33 pg (ref 26.0–34.0)
MCHC: 33 g/dL (ref 30.0–36.0)
MCV: 100 fL (ref 80.0–100.0)
Platelets: 311 10*3/uL (ref 150–400)
RBC: 3.58 MIL/uL — ABNORMAL LOW (ref 4.22–5.81)
RDW: 12.6 % (ref 11.5–15.5)
WBC: 11.8 10*3/uL — ABNORMAL HIGH (ref 4.0–10.5)
nRBC: 0 % (ref 0.0–0.2)

## 2020-05-18 LAB — BASIC METABOLIC PANEL
Anion gap: 10 (ref 5–15)
BUN: 14 mg/dL (ref 8–23)
CO2: 27 mmol/L (ref 22–32)
Calcium: 9.5 mg/dL (ref 8.9–10.3)
Chloride: 102 mmol/L (ref 98–111)
Creatinine, Ser: 0.59 mg/dL — ABNORMAL LOW (ref 0.61–1.24)
GFR, Estimated: >60 mL/min (ref >60)
Glucose, Bld: 120 mg/dL — ABNORMAL HIGH (ref 70–99)
Potassium: 3.8 mmol/L (ref 3.5–5.1)
Sodium: 139 mmol/L (ref 135–145)

## 2020-05-18 LAB — URINE CULTURE: Special Requests: NORMAL

## 2020-05-18 LAB — SURGICAL PCR SCREEN
MRSA, PCR: NEGATIVE
Staphylococcus aureus: NEGATIVE

## 2020-05-18 LAB — GLUCOSE, CAPILLARY
Glucose-Capillary: 130 mg/dL — ABNORMAL HIGH (ref 70–99)
Glucose-Capillary: 269 mg/dL — ABNORMAL HIGH (ref 70–99)

## 2020-05-18 SURGERY — ARTHROPLASTY, HIP, TOTAL, ANTERIOR APPROACH
Anesthesia: General | Site: Hip | Laterality: Left

## 2020-05-18 MED ORDER — ONDANSETRON HCL 4 MG/2ML IJ SOLN
INTRAMUSCULAR | Status: DC | PRN
  Administered 2020-05-18: 4 mg via INTRAVENOUS

## 2020-05-18 MED ORDER — MIDAZOLAM HCL 2 MG/2ML IJ SOLN
INTRAMUSCULAR | Status: DC | PRN
  Administered 2020-05-18: 1 mg via INTRAVENOUS

## 2020-05-18 MED ORDER — OXYCODONE HCL 5 MG PO TABS: 5.0000 mg | ORAL_TABLET | Freq: Once | ORAL | Status: DC | PRN

## 2020-05-18 MED ORDER — PROPOFOL 10 MG/ML IV BOLUS
INTRAVENOUS | Status: DC | PRN
  Administered 2020-05-18: 80 mg via INTRAVENOUS

## 2020-05-18 MED ORDER — SENNA 8.6 MG PO TABS
1.0000 | ORAL_TABLET | Freq: Two times a day (BID) | ORAL | Status: DC
  Administered 2020-05-18 – 2020-05-20 (×3): 8.6 mg via ORAL
  Filled 2020-05-18 (×3): qty 1

## 2020-05-18 MED ORDER — SODIUM CHLORIDE (PF) 0.9 % IJ SOLN
INTRAMUSCULAR | Status: DC | PRN
  Administered 2020-05-18: 30 mL

## 2020-05-18 MED ORDER — ACETAMINOPHEN 500 MG PO TABS
1000.0000 mg | ORAL_TABLET | Freq: Once | ORAL | Status: AC
  Administered 2020-05-18: 1000 mg via ORAL
  Filled 2020-05-18: qty 2

## 2020-05-18 MED ORDER — PROMETHAZINE HCL 25 MG/ML IJ SOLN: 6.2500 mg | INTRAMUSCULAR | Status: DC | PRN

## 2020-05-18 MED ORDER — 0.9 % SODIUM CHLORIDE (POUR BTL) OPTIME
TOPICAL | Status: DC | PRN
  Administered 2020-05-18: 1000 mL

## 2020-05-18 MED ORDER — ALBUMIN HUMAN 5 % IV SOLN: INTRAVENOUS | Status: DC | PRN

## 2020-05-18 MED ORDER — ROCURONIUM BROMIDE 10 MG/ML (PF) SYRINGE
PREFILLED_SYRINGE | INTRAVENOUS | Status: DC | PRN
  Administered 2020-05-18: 50 mg via INTRAVENOUS

## 2020-05-18 MED ORDER — FENTANYL CITRATE (PF) 250 MCG/5ML IJ SOLN
INTRAMUSCULAR | Status: AC
  Filled 2020-05-18: qty 5

## 2020-05-18 MED ORDER — SODIUM CHLORIDE 0.9 % IV SOLN
INTRAVENOUS | Status: AC | PRN
  Administered 2020-05-18: 1000 mL

## 2020-05-18 MED ORDER — PHENYLEPHRINE HCL-NACL 10-0.9 MG/250ML-% IV SOLN
INTRAVENOUS | Status: DC | PRN
  Administered 2020-05-18: 25 ug/min via INTRAVENOUS

## 2020-05-18 MED ORDER — BUPIVACAINE-EPINEPHRINE (PF) 0.5% -1:200000 IJ SOLN
INTRAMUSCULAR | Status: DC | PRN
  Administered 2020-05-18: 50 mL

## 2020-05-18 MED ORDER — CHLORHEXIDINE GLUCONATE 0.12 % MT SOLN
OROMUCOSAL | Status: AC
  Administered 2020-05-18: 15 mL
  Filled 2020-05-18: qty 15

## 2020-05-18 MED ORDER — ONDANSETRON HCL 4 MG/2ML IJ SOLN: 4.0000 mg | Freq: Four times a day (QID) | INTRAMUSCULAR | Status: DC | PRN

## 2020-05-18 MED ORDER — SUGAMMADEX SODIUM 200 MG/2ML IV SOLN
INTRAVENOUS | Status: DC | PRN
  Administered 2020-05-18: 150 mg via INTRAVENOUS

## 2020-05-18 MED ORDER — APIXABAN 2.5 MG PO TABS
2.5000 mg | ORAL_TABLET | Freq: Two times a day (BID) | ORAL | Status: DC
  Administered 2020-05-19 – 2020-05-20 (×2): 2.5 mg via ORAL
  Filled 2020-05-18 (×2): qty 1

## 2020-05-18 MED ORDER — MIDAZOLAM HCL 2 MG/2ML IJ SOLN: 0.5000 mg | Freq: Once | INTRAMUSCULAR | Status: DC | PRN

## 2020-05-18 MED ORDER — DEXAMETHASONE SODIUM PHOSPHATE 10 MG/ML IJ SOLN
INTRAMUSCULAR | Status: DC | PRN
  Administered 2020-05-18: 10 mg via INTRAVENOUS

## 2020-05-18 MED ORDER — OXYCODONE HCL 5 MG/5ML PO SOLN: 5.0000 mg | Freq: Once | ORAL | Status: DC | PRN

## 2020-05-18 MED ORDER — CHLORHEXIDINE GLUCONATE CLOTH 2 % EX PADS
6.0000 | MEDICATED_PAD | Freq: Every day | CUTANEOUS | Status: DC
  Administered 2020-05-18 – 2020-05-20 (×3): 6 via TOPICAL

## 2020-05-18 MED ORDER — ONDANSETRON HCL 4 MG PO TABS: 4.0000 mg | ORAL_TABLET | Freq: Four times a day (QID) | ORAL | Status: DC | PRN

## 2020-05-18 MED ORDER — METOCLOPRAMIDE HCL 5 MG PO TABS: 5.0000 mg | ORAL_TABLET | Freq: Three times a day (TID) | ORAL | Status: DC | PRN

## 2020-05-18 MED ORDER — MIDAZOLAM HCL 2 MG/2ML IJ SOLN
INTRAMUSCULAR | Status: AC
  Filled 2020-05-18: qty 2

## 2020-05-18 MED ORDER — SODIUM CHLORIDE 0.9 % IR SOLN
Status: DC | PRN
  Administered 2020-05-18: 3000 mL

## 2020-05-18 MED ORDER — PHENOL 1.4 % MT LIQD: 1.0000 | OROMUCOSAL | Status: DC | PRN

## 2020-05-18 MED ORDER — DOCUSATE SODIUM 100 MG PO CAPS
100.0000 mg | ORAL_CAPSULE | Freq: Two times a day (BID) | ORAL | Status: DC
  Administered 2020-05-18 – 2020-05-20 (×3): 100 mg via ORAL
  Filled 2020-05-18 (×3): qty 1

## 2020-05-18 MED ORDER — CEFAZOLIN SODIUM-DEXTROSE 2-4 GM/100ML-% IV SOLN
2.0000 g | Freq: Four times a day (QID) | INTRAVENOUS | Status: AC
  Administered 2020-05-18 (×2): 2 g via INTRAVENOUS
  Filled 2020-05-18 (×2): qty 100

## 2020-05-18 MED ORDER — LIDOCAINE 2% (20 MG/ML) 5 ML SYRINGE
INTRAMUSCULAR | Status: DC | PRN
  Administered 2020-05-18: 20 mg via INTRAVENOUS

## 2020-05-18 MED ORDER — BUPIVACAINE-EPINEPHRINE 0.5% -1:200000 IJ SOLN
INTRAMUSCULAR | Status: AC
  Filled 2020-05-18: qty 1

## 2020-05-18 MED ORDER — KETOROLAC TROMETHAMINE 30 MG/ML IJ SOLN
INTRAMUSCULAR | Status: AC
  Filled 2020-05-18: qty 1

## 2020-05-18 MED ORDER — FENTANYL CITRATE (PF) 250 MCG/5ML IJ SOLN
INTRAMUSCULAR | Status: DC | PRN
  Administered 2020-05-18: 25 ug via INTRAVENOUS
  Administered 2020-05-18 (×2): 50 ug via INTRAVENOUS
  Administered 2020-05-18: 25 ug via INTRAVENOUS
  Administered 2020-05-18 (×3): 50 ug via INTRAVENOUS

## 2020-05-18 MED ORDER — PHENYLEPHRINE HCL (PRESSORS) 10 MG/ML IV SOLN
INTRAVENOUS | Status: DC | PRN
  Administered 2020-05-18 (×2): 80 ug via INTRAVENOUS

## 2020-05-18 MED ORDER — PROPOFOL 10 MG/ML IV BOLUS
INTRAVENOUS | Status: AC
  Filled 2020-05-18: qty 20

## 2020-05-18 MED ORDER — MENTHOL 3 MG MT LOZG: 1.0000 | LOZENGE | OROMUCOSAL | Status: DC | PRN

## 2020-05-18 MED ORDER — KETOROLAC TROMETHAMINE 30 MG/ML IJ SOLN
INTRAMUSCULAR | Status: DC | PRN
  Administered 2020-05-18: 30 mg via INTRA_ARTICULAR

## 2020-05-18 MED ORDER — APIXABAN 2.5 MG PO TABS: 2.5000 mg | ORAL_TABLET | Freq: Two times a day (BID) | ORAL | Status: DC

## 2020-05-18 MED ORDER — HYDROMORPHONE HCL 1 MG/ML IJ SOLN: 0.2500 mg | INTRAMUSCULAR | Status: DC | PRN

## 2020-05-18 MED ORDER — METOCLOPRAMIDE HCL 5 MG/ML IJ SOLN: 5.0000 mg | Freq: Three times a day (TID) | INTRAMUSCULAR | Status: DC | PRN

## 2020-05-18 MED ORDER — LACTATED RINGERS IV SOLN: INTRAVENOUS | Status: DC | PRN

## 2020-05-18 SURGICAL SUPPLY — 61 items
ADH SKN CLS APL DERMABOND .7 (GAUZE/BANDAGES/DRESSINGS) ×2
ALCOHOL 70% 16 OZ (MISCELLANEOUS) ×3 IMPLANT
APL PRP STRL LF DISP 70% ISPRP (MISCELLANEOUS) ×1
CHLORAPREP W/TINT 26 (MISCELLANEOUS) ×3 IMPLANT
COVER SURGICAL LIGHT HANDLE (MISCELLANEOUS) ×3 IMPLANT
CUP SECTOR GRIPTON 58MM (Orthopedic Implant) ×2 IMPLANT
DERMABOND ADVANCED (GAUZE/BANDAGES/DRESSINGS) ×4
DERMABOND ADVANCED .7 DNX12 (GAUZE/BANDAGES/DRESSINGS) ×2 IMPLANT
DRAPE C-ARM 42X72 X-RAY (DRAPES) ×3 IMPLANT
DRAPE STERI IOBAN 125X83 (DRAPES) ×3 IMPLANT
DRAPE U-SHAPE 47X51 STRL (DRAPES) ×9 IMPLANT
DRSG AQUACEL AG ADV 3.5X10 (GAUZE/BANDAGES/DRESSINGS) ×3 IMPLANT
ELECT BLADE 4.0 EZ CLEAN MEGAD (MISCELLANEOUS) ×3
ELECT PENCIL ROCKER SW 15FT (MISCELLANEOUS) ×3 IMPLANT
ELECT REM PT RETURN 9FT ADLT (ELECTROSURGICAL) ×3
ELECTRODE BLDE 4.0 EZ CLN MEGD (MISCELLANEOUS) ×1 IMPLANT
ELECTRODE REM PT RTRN 9FT ADLT (ELECTROSURGICAL) ×1 IMPLANT
GLOVE BIO SURGEON STRL SZ8.5 (GLOVE) ×6 IMPLANT
GLOVE BIOGEL M 7.0 STRL (GLOVE) ×3 IMPLANT
GLOVE BIOGEL PI IND STRL 7.5 (GLOVE) ×1 IMPLANT
GLOVE BIOGEL PI IND STRL 8.5 (GLOVE) ×1 IMPLANT
GLOVE BIOGEL PI INDICATOR 7.5 (GLOVE) ×2
GLOVE BIOGEL PI INDICATOR 8.5 (GLOVE) ×2
GOWN STRL REUS W/ TWL LRG LVL3 (GOWN DISPOSABLE) ×2 IMPLANT
GOWN STRL REUS W/TWL 2XL LVL3 (GOWN DISPOSABLE) ×5 IMPLANT
GOWN STRL REUS W/TWL LRG LVL3 (GOWN DISPOSABLE) ×6
HANDPIECE INTERPULSE COAX TIP (DISPOSABLE) ×3
HEAD CERAMIC 36 PLUS 8.5 12 14 (Hips) ×2 IMPLANT
HOOD PEEL AWAY FACE SHEILD DIS (HOOD) ×8 IMPLANT
JET LAVAGE IRRISEPT WOUND (IRRIGATION / IRRIGATOR) ×3
KIT BASIN OR (CUSTOM PROCEDURE TRAY) ×3 IMPLANT
KIT TURNOVER KIT B (KITS) ×3 IMPLANT
LAVAGE JET IRRISEPT WOUND (IRRIGATION / IRRIGATOR) ×1 IMPLANT
LINER NEUTRAL 52X36X58N (Liner) ×2 IMPLANT
MANIFOLD NEPTUNE II (INSTRUMENTS) ×3 IMPLANT
MARKER SKIN DUAL TIP RULER LAB (MISCELLANEOUS) ×6 IMPLANT
NDL SPNL 18GX3.5 QUINCKE PK (NEEDLE) ×1 IMPLANT
NEEDLE SPNL 18GX3.5 QUINCKE PK (NEEDLE) ×3 IMPLANT
NS IRRIG 1000ML POUR BTL (IV SOLUTION) ×3 IMPLANT
PACK TOTAL JOINT (CUSTOM PROCEDURE TRAY) ×3 IMPLANT
PACK UNIVERSAL I (CUSTOM PROCEDURE TRAY) ×3 IMPLANT
PAD ARMBOARD 7.5X6 YLW CONV (MISCELLANEOUS) ×6 IMPLANT
SAW OSC TIP CART 19.5X105X1.3 (SAW) ×3 IMPLANT
SEALER BIPOLAR AQUA 6.0 (INSTRUMENTS) ×2 IMPLANT
SET HNDPC FAN SPRY TIP SCT (DISPOSABLE) ×1 IMPLANT
STEM TRI LOC BPS SZ8 W GRIPTON IMPLANT
SUT ETHIBOND NAB CT1 #1 30IN (SUTURE) ×6 IMPLANT
SUT MNCRL AB 3-0 PS2 18 (SUTURE) ×3 IMPLANT
SUT MON AB 2-0 CT1 36 (SUTURE) ×3 IMPLANT
SUT VIC AB 1 CT1 27 (SUTURE) ×3
SUT VIC AB 1 CT1 27XBRD ANBCTR (SUTURE) ×1 IMPLANT
SUT VIC AB 2-0 CT1 27 (SUTURE) ×3
SUT VIC AB 2-0 CT1 TAPERPNT 27 (SUTURE) ×1 IMPLANT
SUT VLOC 180 0 24IN GS25 (SUTURE) ×3 IMPLANT
SYR 50ML LL SCALE MARK (SYRINGE) ×3 IMPLANT
TOWEL GREEN STERILE (TOWEL DISPOSABLE) ×3 IMPLANT
TOWEL GREEN STERILE FF (TOWEL DISPOSABLE) ×3 IMPLANT
TRAY FOLEY W/BAG SLVR 16FR (SET/KITS/TRAYS/PACK) ×3
TRAY FOLEY W/BAG SLVR 16FR ST (SET/KITS/TRAYS/PACK) IMPLANT
TRI LOC BPS SZ8 W GRIPTON ×3 IMPLANT
WATER STERILE IRR 1000ML POUR (IV SOLUTION) ×5 IMPLANT

## 2020-05-18 NOTE — Anesthesia Postprocedure Evaluation (Signed)
Anesthesia Post Note  Patient: SHADEN LACHER  Procedure(s) Performed: TOTAL HIP ARTHROPLASTY ANTERIOR APPROACH (Left Hip)     Patient location during evaluation: PACU Anesthesia Type: General Level of consciousness: awake and alert, patient cooperative and oriented Pain management: pain level controlled Vital Signs Assessment: post-procedure vital signs reviewed and stable Respiratory status: spontaneous breathing, nonlabored ventilation and respiratory function stable Cardiovascular status: blood pressure returned to baseline and stable Postop Assessment: no apparent nausea or vomiting Anesthetic complications: no   No complications documented.  Last Vitals:  Vitals:   05/18/20 1025 05/18/20 1040  BP: 130/71 120/73  Pulse: 95 93  Resp: 20 14  Temp:  (!) 36.4 C  SpO2: 98% 99%    Last Pain:  Vitals:   05/18/20 1040  TempSrc:   PainSc: 0-No pain                 Errin Whitelaw,E. Keyonta Barradas

## 2020-05-18 NOTE — Anesthesia Procedure Notes (Signed)
Procedure Name: Intubation Date/Time: 05/18/2020 7:50 AM Performed by: Clearnce Sorrel, CRNA Pre-anesthesia Checklist: Patient identified, Emergency Drugs available, Suction available, Patient being monitored and Timeout performed Patient Re-evaluated:Patient Re-evaluated prior to induction Oxygen Delivery Method: Circle system utilized Preoxygenation: Pre-oxygenation with 100% oxygen Induction Type: IV induction Ventilation: Mask ventilation without difficulty Laryngoscope Size: Mac and 4 Grade View: Grade II Tube type: Oral Tube size: 7.5 mm Number of attempts: 1 Airway Equipment and Method: Stylet Placement Confirmation: ETT inserted through vocal cords under direct vision,  positive ETCO2 and breath sounds checked- equal and bilateral Secured at: 23 cm Tube secured with: Tape Dental Injury: Teeth and Oropharynx as per pre-operative assessment

## 2020-05-18 NOTE — Plan of Care (Signed)

## 2020-05-18 NOTE — Op Note (Signed)
OPERATIVE REPORT  SURGEON: Rod Can, MD   ASSISTANT: Clabe Seal, [place]  PREOPERATIVE DIAGNOSIS: Displaced Left femoral neck fracture.   POSTOPERATIVE DIAGNOSIS: Displaced Left femoral neck fracture.   PROCEDURE: Left total hip arthroplasty, anterior approach.   IMPLANTS: DePuy Tri Lock stem, size 8, hi offset. DePuy Pinnacle Cup, size 58 mm. DePuy Altrx liner, size 36 by 58 mm, neutral. DePuy Biolox ceramic head ball, size 36 + 8.5 mm.  ANESTHESIA:  General  ANTIBIOTICS: 2g ancef.  ESTIMATED BLOOD LOSS:-350 mL    DRAINS: None.  COMPLICATIONS: None   CONDITION: PACU - hemodynamically stable.   BRIEF CLINICAL NOTE: Brandon Castillo is a 69 y.o. male with a displaced Left femoral neck fracture. The patient was admitted to the hospitalist service and underwent perioperative risk stratification and medical optimization. The risks, benefits, and alternatives to total hip arthroplasty were explained, and the patient elected to proceed.  PROCEDURE IN DETAIL: The patient was taken to the operating room and general anesthesia was induced on the hospital bed.  The patient was then positioned on the Hana table.  All bony prominences were well padded.  The hip was prepped and draped in the normal sterile surgical fashion.  A time-out was called verifying side and site of surgery. Antibiotics were given within 60 minutes of beginning the procedure.  The direct anterior approach to the hip was performed through the Hueter interval.  Lateral femoral circumflex vessels were treated with the Auqumantys. The anterior capsule was exposed and an inverted T capsulotomy was made.  Fracture hematoma was encountered and evacuated. The patient was found to have a comminuted Left subcapital femoral neck fracture.  I freshened the femoral neck cut with a saw.  I removed the femoral neck fragment.  A corkscrew was placed into the head and the head was removed.  This was passed to the back  table and was measured.   Acetabular exposure was achieved, and the pulvinar and labrum were excised. Sequential reaming of the acetabulum was then performed up to a size 57 mm reamer. A 58 mm cup was then opened and impacted into place at approximately 40 degrees of abduction and 20 degrees of anteversion. The final polyethylene liner was impacted into place.    I then gained femoral exposure taking care to protect the abductors and greater trochanter.  This was performed using standard external rotation, extension, and adduction.  The capsule was peeled off the inner aspect of the greater trochanter, taking care to preserve the short external rotators. A cookie cutter was used to enter the femoral canal, and then the femoral canal finder was used to confirm location.  I then sequentially broached up to a size 8.  Calcar planer was used on the femoral neck remnant.  I paced a hi neck and a trial head ball. The hip was reduced.  Leg lengths were checked fluoroscopically.  The hip was dislocated and trial components were removed.  I placed the real stem followed by the real head ball.  The hip was reduced.  Fluoroscopy was used to confirm component position and leg lengths.  At 90 degrees of external rotation and extension, the hip was stable to an anterior directed force.   The wound was copiously irrigated with Irrisept solution and normal saline using pule lavage.  Marcaine solution was injected into the periarticular soft tissue.  The wound was closed in layers using #1 Vicryl and V-Loc for the fascia, 2-0 Vicryl for the subcutaneous fat, 2-0  Monocryl for the deep dermal layer, 3-0 running Monocryl subcuticular stitch and glue for the skin.  Once the glue was fully dried, an Aquacell Ag dressing was applied.  The patient was then awakened from anesthesia and transported to the recovery room in stable condition.  Sponge, needle, and instrument counts were correct at the end of the case x2.  The patient  tolerated the procedure well and there were no known complications.  Please note that a surgical assistant was a medical necessity for this procedure to perform it in a safe and expeditious manner. Assistant was necessary to provide appropriate retraction of vital neurovascular structures, to prevent femoral fracture, and to allow for anatomic placement of the prosthesis.  POSTOPERATIVE PLAN: The patient will be readmitted to the hospitalist. Beginning tomorrow morning, start apixaban 2.5 mg PO BID for DVT prophylaxis. Mobilize out of bed with physical therapy. He will undergo disposition planning. Return the the office in 2 weeks for routine postoperative care.

## 2020-05-18 NOTE — Anesthesia Preprocedure Evaluation (Addendum)
Anesthesia Evaluation  Patient identified by MRN, date of birth, ID band Patient awake    Reviewed: Allergy & Precautions, NPO status , Patient's Chart, lab work & pertinent test results  History of Anesthesia Complications Negative for: history of anesthetic complications  Airway Mallampati: II  TM Distance: >3 FB Neck ROM: Full    Dental  (+) Poor Dentition, Chipped, Missing, Dental Advisory Given   Pulmonary COPD, former smoker,  05/17/2020 SARS coronavirus NEG RUL mass: metastatic R lung cancer   breath sounds clear to auscultation       Cardiovascular (-) anginanegative cardio ROS   Rhythm:Regular Rate:Normal     Neuro/Psych Anxiety MS    GI/Hepatic Neg liver ROS, GERD  Medicated and Controlled,  Endo/Other  negative endocrine ROS  Renal/GU negative Renal ROS     Musculoskeletal   Abdominal   Peds  Hematology negative hematology ROS (+)   Anesthesia Other Findings   Reproductive/Obstetrics                           Anesthesia Physical Anesthesia Plan  ASA: III  Anesthesia Plan: General   Post-op Pain Management:    Induction: Intravenous  PONV Risk Score and Plan: 2 and Ondansetron and Dexamethasone  Airway Management Planned: Oral ETT  Additional Equipment: None  Intra-op Plan:   Post-operative Plan: Extubation in OR  Informed Consent: I have reviewed the patients History and Physical, chart, labs and discussed the procedure including the risks, benefits and alternatives for the proposed anesthesia with the patient or authorized representative who has indicated his/her understanding and acceptance.   Patient has DNR.  Discussed DNR with patient and Suspend DNR.   Dental advisory given  Plan Discussed with: CRNA and Surgeon  Anesthesia Plan Comments:        Anesthesia Quick Evaluation

## 2020-05-18 NOTE — Transfer of Care (Signed)
Immediate Anesthesia Transfer of Care Note  Patient: Brandon Castillo  Procedure(s) Performed: TOTAL HIP ARTHROPLASTY ANTERIOR APPROACH (Left Hip)  Patient Location: PACU  Anesthesia Type:General  Level of Consciousness: awake, alert  and oriented  Airway & Oxygen Therapy: Patient Spontanous Breathing  Post-op Assessment: Report given to RN and Post -op Vital signs reviewed and stable  Post vital signs: Reviewed and stable  Last Vitals:  Vitals Value Taken Time  BP    Temp    Pulse    Resp    SpO2      Last Pain:  Vitals:   05/18/20 0656  TempSrc:   PainSc: 8       Patients Stated Pain Goal: 3 (21/58/72 7618)  Complications: No complications documented.

## 2020-05-18 NOTE — Interval H&P Note (Signed)
History and Physical Interval Note:  05/18/2020 7:44 AM  Brandon Castillo  has presented today for surgery, with the diagnosis of HIP FX.  The various methods of treatment have been discussed with the patient and family. After consideration of risks, benefits and other options for treatment, the patient has consented to  Procedure(s): TOTAL HIP ARTHROPLASTY ANTERIOR APPROACH (Left) as a surgical intervention.  The patient's history has been reviewed, patient examined, no change in status, stable for surgery.  I have reviewed the patient's chart and labs.  Questions were answered to the patient's satisfaction.    The risks, benefits, and alternatives were discussed with the patient. There are risks associated with the surgery including, but not limited to, problems with anesthesia (death), infection, instability (giving out of the joint), dislocation, differences in leg length/angulation/rotation, fracture of bones, loosening or failure of implants, hematoma (blood accumulation) which may require surgical drainage, blood clots, pulmonary embolism, nerve injury (foot drop and lateral thigh numbness), and blood vessel injury. The patient understands these risks and elects to proceed.    Hilton Cork Lamere Lightner

## 2020-05-18 NOTE — Progress Notes (Signed)
PROGRESS NOTE    Brandon Castillo  QJJ:[id number] DOB: Oct 12, 1950 DOA: 05/17/2020 PCP: Patient, No Pcp Per  Brief Narrative:  69 year old white male Known multiple sclerosis with blindness Former heavy smoker Admitted to [facility] 10/31 through 05/16/2020 after having been found with decreasing appetite generalized weakness difficulty urinating and abdominal pain and generalized frailty CT scan abdomen showed bilateral renal masses liver nodules bilateral adrenal nodules-during that hospital stay underwent liver biopsy showing an Justice lung primary needs Port-A-Cath placement--palliative care also saw the patient during the hospital stay Also had a scrotal sebaceous cyst expectant management  Was discharged on 05/16/2020 but readmitted after having an accidental fall with left displaced femoral fracture in addition to comminuted left wrist fracture of distal radius   Assessment & Plan:   Principal Problem:   Closed left femoral fracture (HCC) Active Problems:   Multiple sclerosis (HCC)   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 Left femoral fracture a. Status post repair 11/7 b. Will need Eliquis twice daily for anticoagulation, weightbearing as tolerated or as per surgeons c. Will need outpatient follow-up with orthopedic  2. Left hip wrist fracture a. Defer to orthopedics planning for repair 3. Lung cancer right upper lobe with metastases needing Port-A-Cath a. Will place order for Port-A-Cath as well need chemotherapy in the near future b. Will delay eliquis till tomorrow evening--orde rin place for IR to place porta-cath and is NPO post Midnight c. Defer management of cancer to the outpatient  setting 4. COPD/emphysema a. Does not usually use oxygen he tells me b. Not on inhalers-monitor and add if needed 5. Possible UTI a. Multiple colony-forming units on urine therefore discontinue antibiotics 6. Anxiety a. Continue trazodone 50 at bedtime, Ativan every 8 as needed anxiety 7. Multiple sclerosis with blindness a. Is dependent on family for care b. Probably will need skilled c. Outpatient follow-up 8. 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 Stable at this time  DVT prophylaxis: To start Eliquis a.m. Code Status: Presumed full Family Communication: None at the bedside currently Disposition:   Status is: Inpatient  Remains inpatient appropriate because:Hemodynamically unstable, Persistent severe electrolyte disturbances and Ongoing diagnostic testing needed not appropriate for outpatient work up   Dispo: The patient is from: Home              Anticipated d/c is to: SNF              Anticipated d/c date is: 2 days              Patient currently is not medically stable to d/c.       Consultants:   Orthopedics hip repair  Procedures: 11/7 hip repair  Antimicrobials: Stopped awake alert coherent conversant tells me finished all the dyspnea no chest pain no fever   Subjective: No nausea no vomiting looks relatively comfortable states pain is 50% better than previous to surgery   Objective: Vitals:   05/17/20 2005 05/18/20 0015 05/18/20 0418 05/18/20 0656  BP: 114/61 111/69 134/67 (!) 159/75  Pulse: (!) 103 77 93 96  Resp: 16 16 16 16   Temp: 98.2 F (36.8 C) 97.7 F (36.5 C) 98.4 F (36.9 C)   TempSrc: Oral Oral Oral   SpO2: 94% 94%  95%   Weight:      Height:        Intake/Output Summary (Last 24 hours) at 05/18/2020 0805 Last data filed at 05/18/2020 0435 Gross per 24 hour  Intake 482.59 ml  Output 1200 ml  Net -717.41 ml   Filed Weights   05/17/20 0041  Weight: 68 kg    Examination:  Awake coherent no distress is blind no JVD quite  frail Chest clear no added sound no rales no rhonchi on oxygen Abdomen soft no rebound no guarding Power 5/5 No lower extremity edema S1-S2 no murmur no rub no gallop  Data Reviewed: I have personally reviewed following labs and imaging studies BUNs/creatinine 14/0.5 Potassium 3.8 Hemoglobin 11.8 WBC down from 16.8-11.8 Platelet 311    Radiology Studies: DG Chest 1 View  Result Date: 05/17/2020 CLINICAL DATA:  Initial evaluation for acute trauma, fall. EXAM: CHEST  1 VIEW COMPARISON:  Prior radiograph from 05/11/2020. FINDINGS: Cardiac and mediastinal silhouettes are stable in size and contour, and may within normal limits. Aortic atherosclerosis. Lungs are hyperinflated with underlying emphysematous changes. No focal infiltrates. No edema or effusion. No pneumothorax. No acute osseous finding. IMPRESSION: 1. No active cardiopulmonary disease. 2. Aortic Atherosclerosis (ICD10-I70.0) and Emphysema (ICD10-J43.9). Electronically Signed   By: Jeannine Boga M.D.   On: 05/17/2020 04:27   DG Wrist Complete Left  Result Date: 05/17/2020 CLINICAL DATA:  Initial evaluation for acute pain and swelling, fall downstairs. EXAM: LEFT WRIST - COMPLETE 3+ VIEW COMPARISON:  None. FINDINGS: Acute comminuted fracture of the distal right radius with slight impaction and dorsal angulation. Associated intra-articular extension. Distal ulna intact. Diffuse soft tissue swelling present about the wrist. IMPRESSION: Acute comminuted fracture of the distal right radius with intra-articular extension and dorsal angulation. Electronically Signed   By: Jeannine Boga M.D.   On: 05/17/2020 04:19   DG Ankle Complete Left  Result Date: 05/17/2020 CLINICAL DATA:  Initial evaluation for acute trauma, fall. EXAM: LEFT ANKLE COMPLETE - 3+ VIEW COMPARISON:  None. FINDINGS: No acute fracture dislocation. Ankle mortise approximated. Corticated osseous density at the medial malleolus consistent with a chronic finding.  Diffuse soft tissue swelling present about the ankle. Underlying osteoarthritic changes noted. IMPRESSION: 1. No acute fracture or dislocation. 2. Diffuse soft tissue swelling about the ankle. Electronically Signed   By: Jeannine Boga M.D.   On: 05/17/2020 04:21   CT Head Wo Contrast  Result Date: 05/17/2020 CLINICAL DATA:  Status post trauma.  Fall. EXAM: CT HEAD WITHOUT CONTRAST TECHNIQUE: Contiguous axial images were obtained from the base of the skull through the vertex without intravenous contrast. COMPARISON:  Brain MRI 05/12/2020 FINDINGS: Brain: No evidence of acute infarction, hemorrhage, hydrocephalus, extra-axial collection or mass lesion/mass effect. There is mild diffuse low-attenuation within the subcortical and periventricular white matter compatible with chronic microvascular disease. Prominence of the sulci and ventricles compatible with mild brain atrophy. Vascular: No hyperdense vessel or unexpected calcification. Skull: Normal. Negative for fracture or focal lesion. Sinuses/Orbits: No acute finding. Other: None. IMPRESSION: 1. No acute intracranial abnormalities. 2. Chronic small vessel ischemic change and brain atrophy. Electronically Signed   By: Kerby Moors M.D.   On: 05/17/2020 04:10   CT FOOT LEFT WO CONTRAST  Result Date: 05/17/2020 CLINICAL DATA:  Ankle and foot injury question of fracture EXAM: CT OF THE LEFT FOOT WITHOUT CONTRAST TECHNIQUE: Multidetector CT imaging of the left foot was performed according to the standard protocol. Multiplanar CT image reconstructions were also generated. COMPARISON:  None. FINDINGS: Bones/Joint/Cartilage No fracture or dislocation. No widening of the Lisfranc joint is seen. There is diffuse osteopenia. Small well corticated ossicle seen at the adjacent to the medial malleolus. Mild tibiotalar joint osteoarthritis seen with joint space loss and subchondral sclerosis. First MTP joint osteoarthritis is with joint space. Ligaments  Suboptimally assessed by CT. Muscles and Tendons The muscles surrounding the foot are intact without focal atrophy or tear. The flexor and extensor tendons intact. The plantar fascia and Achilles tendon. Soft tissues Soft tissue swelling.  No ankle joint effusion is noted. IMPRESSION: No acute fracture or dislocation the ankle or foot. Electronically Signed   By: Prudencio Pair M.D.   On: 05/17/2020 21:24   CT ANKLE LEFT WO CONTRAST  Result Date: 05/17/2020 CLINICAL DATA:  Ankle and foot injury question of fracture EXAM: CT OF THE LEFT FOOT WITHOUT CONTRAST TECHNIQUE: Multidetector CT imaging of the left foot was performed according to the standard protocol. Multiplanar CT image reconstructions were also generated. COMPARISON:  None. FINDINGS: Bones/Joint/Cartilage No fracture or dislocation. No widening of the Lisfranc joint is seen. There is diffuse osteopenia. Small well corticated ossicle seen at the adjacent to the medial malleolus. Mild tibiotalar joint osteoarthritis seen with joint space loss and subchondral sclerosis. First MTP joint osteoarthritis is with joint space. Ligaments Suboptimally assessed by CT. Muscles and Tendons The muscles surrounding the foot are intact without focal atrophy or tear. The flexor and extensor tendons intact. The plantar fascia and Achilles tendon. Soft tissues Soft tissue swelling.  No ankle joint effusion is noted. IMPRESSION: No acute fracture or dislocation the ankle or foot. Electronically Signed   By: Prudencio Pair M.D.   On: 05/17/2020 21:24   MR FEMUR LEFT WO CONTRAST  Result Date: 05/17/2020 CLINICAL DATA:  Left femoral neck fracture after fall. History of metastatic lung cancer. EXAM: MR OF THE LEFT FEMUR WITHOUT CONTRAST TECHNIQUE: Multiplanar, multisequence MR imaging of the left femur was performed. No intravenous contrast was administered. COMPARISON:  Left hip x-rays from same day. CT abdomen pelvis dated May 12, 2011. FINDINGS: Bones/Joint/Cartilage  Acute mildly displaced left femoral neck fracture with apex anterior angulation again noted. No focal bone lesion. No dislocation. No joint effusion. Muscles and Tendons Mild edema in the bilateral adductor muscles.  No muscle atrophy. Soft tissue No fluid collection or hematoma.  No soft tissue mass. IMPRESSION: 1. Acute mildly displaced left femoral neck fracture again noted. No underlying bone lesion. Electronically Signed   By: Titus Dubin M.D.   On: 05/17/2020 16:12   DG Foot Complete Left  Result Date: 05/17/2020 CLINICAL DATA:  Initial evaluation for acute trauma, fall. EXAM: LEFT FOOT - COMPLETE 3+ VIEW COMPARISON:  None. FINDINGS: No acute fracture or dislocation. Joint spaces maintained. Mild soft tissue swelling about the midfoot and ankle. 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 IMPRESSION: 1. No acute fracture or dislocation. 2. Mild soft tissue swelling about the midfoot and ankle. Electronically Signed   By: Jeannine Boga M.D.   On: 05/17/2020 04:25   DG Hip Unilat W or Wo Pelvis 2-3 Views Left  Result Date: 05/17/2020 CLINICAL DATA:  Initial evaluation for acute trauma, fall. EXAM: DG HIP (WITH OR WITHOUT PELVIS) 2-3V LEFT COMPARISON:  None. FINDINGS: Acute fracture extends through the left femoral neck with slight superior subluxation. Femoral head remains in normal alignment with the acetabulum. Acetabulum itself appears intact. Bony pelvis intact. Limited views of the right hip demonstrate no acute finding. Underlying osteopenia. Scattered  vascular calcifications noted about the inguinal regions bilaterally. IMPRESSION: Acute mildly displaced fracture of the left femoral neck. Electronically Signed   By: Jeannine Boga M.D.   On: 05/17/2020 04:22     Scheduled Meds: . chlorhexidine  60 mL Topical Once  . [MAR Hold] feeding supplement  237 mL Oral TID  . feeding supplement  296 mL Oral Once  . [MAR Hold] methocarbamol  500 mg Oral QID  . [MAR Hold]  multivitamin with minerals  1 tablet Oral Daily  . [MAR Hold] pantoprazole  40 mg Oral Daily  . [MAR Hold] polyethylene glycol  17 g Oral Daily  . povidone-iodine  2 application Topical Once  . [MAR Hold] senna-docusate  2 tablet Oral BID  . [MAR Hold] sertraline  50 mg Oral Daily  . [MAR Hold] sodium chloride flush  3 mL Intravenous Q12H  . [MAR Hold] sodium chloride flush  3 mL Intravenous Q12H  . [MAR Hold] traZODone  50 mg Oral QHS   Continuous Infusions: . [MAR Hold] sodium chloride    . [MAR Hold] cefTRIAXone (ROCEPHIN)  IV Stopped (05/17/20 0912)  . dextrose 5 % and 0.45% NaCl 40 mL/hr at 05/18/20 0400  . tranexamic acid       LOS: 1 day    Time spent: [place], MD [facility] To contact the attending provider between 7A-7P or the covering provider during after hours 7P-7A, please log into the web site [email or web address] and access using universal  password for that web site. If you do not have the password, please call the hospital operator.  05/18/2020, 8:05 AM

## 2020-05-18 NOTE — Consult Note (Signed)
ORTHOPAEDIC CONSULTATION  REQUESTING PHYSICIAN: Nita Sells, MD  PCP:  Patient, No Pcp Per  Chief Complaint: Fall  HPI: Brandon Castillo is a 69 y.o. male who was recently diagnosed with stage IV cancer. He was discharged from the hospital on Friday, and returned to the [facility] ED that evening after a fall at home. He reports he had moved in with his family upon discharge, and turned the wrong way in the house and fell down 4 steps. He denies LOC, but did hit his head. He reports left wrist, hip, and foot/ankle pain. Imaging in the ED revealed left wrist fracture and left femoral neck fracture. Orthopaedics was consulted for management, and the patient was transferred to [facility] for surgical treatment.  This morning he is resting in bed comfortably. He is not taking blood thinners. He reports he ambulates without assistive devices at home.   Past Medical History:  Diagnosis Date  . Cancer [facility])    Diagnosed yesterday  . Multiple sclerosis ([place])    History reviewed. No pertinent surgical history. Social History   Socioeconomic History  . Marital status: Single    Spouse name: Not on file  . Number of children: Not on file  . Years of education: Not on file  . Highest education level: Not on file  Occupational History  . Not on file  Tobacco Use  . Smoking status: Never Smoker  . Smokeless tobacco: Never Used  Vaping Use  . Vaping Use: Never used  Substance and Sexual Activity  . Alcohol use: Not Currently  . Drug use: Not Currently  . Sexual activity: Not on file  Other Topics Concern  . Not on file  Social History Narrative  . Not on file   Social Determinants of Health   Financial Resource Strain:   . Difficulty of Paying Living Expenses: Not on file  Food Insecurity:   . Worried About Charity fundraiser in the Last Year: Not on file  . Ran Out of Food in the Last Year: Not on file  Transportation Needs:   . Lack of Transportation (Medical):  Not on file  . Lack of Transportation (Non-Medical): Not on file  Physical Activity:   . Days of Exercise per Week: Not on file  . Minutes of Exercise per Session: Not on file  Stress:   . Feeling of Stress : Not on file  Social Connections:   . Frequency of Communication with Friends and Family: Not on file  . Frequency of Social Gatherings with Friends and Family: Not on file  . Attends Religious Services: Not on file  . Active Member of Clubs or Organizations: Not on file  . Attends Archivist Meetings: Not on file  . Marital Status: Not on file   History reviewed. No pertinent family history. Allergies  Allergen Reactions  . Demerol [Meperidine Hcl]    Prior to Admission medications   Medication Sig Start Date End Date Taking? Authorizing Provider  feeding supplement (ENSURE ENLIVE / ENSURE PLUS) LIQD Take 237 mLs by mouth 3 (three) times daily. 05/16/20  Yes Roxan Hockey, MD  HYDROcodone-acetaminophen (NORCO) 10-325 MG tablet Take 1-2 tablets by mouth every 6 (six) hours as needed for severe pain. 05/16/20  Yes Emokpae, Courage, MD  LORazepam (ATIVAN) 1 MG tablet Take 1 tablet (1 mg total) by mouth every 8 (eight) hours as needed for anxiety. 05/16/20  Yes Roxan Hockey, MD  multivitamin-lutein (OCUVITE-LUTEIN) CAPS capsule Take 1  capsule by mouth daily. 05/17/20  Yes Emokpae, Courage, MD  ondansetron (ZOFRAN) 4 MG tablet Take 1 tablet (4 mg total) by mouth every 6 (six) hours as needed for nausea. 05/16/20  Yes Emokpae, Courage, MD  pantoprazole (PROTONIX) 40 MG tablet Take 1 tablet (40 mg total) by mouth daily. 05/17/20  Yes Emokpae, Courage, MD  polyethylene glycol (MIRALAX / GLYCOLAX) 17 g packet Take 17 g by mouth daily. 05/17/20  Yes Emokpae, Courage, MD  senna-docusate (SENOKOT-S) 8.6-50 MG tablet Take 2 tablets by mouth at bedtime. 05/16/20 05/16/21 Yes Roxan Hockey, MD  traZODone (DESYREL) 100 MG tablet Take 0.5-1 tablets (50-100 mg total) by mouth at bedtime.  05/16/20  Yes Roxan Hockey, MD   DG Chest 1 View  Result Date: 05/17/2020 CLINICAL DATA:  Initial evaluation for acute trauma, fall. EXAM: CHEST  1 VIEW COMPARISON:  Prior radiograph from 05/11/2020. FINDINGS: Cardiac and mediastinal silhouettes are stable in size and contour, and may within normal limits. Aortic atherosclerosis. Lungs are hyperinflated with underlying emphysematous changes. No focal infiltrates. No edema or effusion. No pneumothorax. No acute osseous finding. IMPRESSION: 1. No active cardiopulmonary disease. 2. Aortic Atherosclerosis (ICD10-I70.0) and Emphysema (ICD10-J43.9). Electronically Signed   By: Jeannine Boga M.D.   On: 05/17/2020 04:27   DG Wrist Complete Left  Result Date: 05/17/2020 CLINICAL DATA:  Initial evaluation for acute pain and swelling, fall downstairs. EXAM: LEFT WRIST - COMPLETE 3+ VIEW COMPARISON:  None. FINDINGS: Acute comminuted fracture of the distal right radius with slight impaction and dorsal angulation. Associated intra-articular extension. Distal ulna intact. Diffuse soft tissue swelling present about the wrist. IMPRESSION: Acute comminuted fracture of the distal right radius with intra-articular extension and dorsal angulation. Electronically Signed   By: Jeannine Boga M.D.   On: 05/17/2020 04:19   DG Ankle Complete Left  Result Date: 05/17/2020 CLINICAL DATA:  Initial evaluation for acute trauma, fall. EXAM: LEFT ANKLE COMPLETE - 3+ VIEW COMPARISON:  None. FINDINGS: No acute fracture dislocation. Ankle mortise approximated. Corticated osseous density at the medial malleolus consistent with a chronic finding. Diffuse soft tissue swelling present about the ankle. Underlying osteoarthritic changes noted. IMPRESSION: 1. No acute fracture or dislocation. 2. Diffuse soft tissue swelling about the ankle. Electronically Signed   By: Jeannine Boga M.D.   On: 05/17/2020 04:21   CT Head Wo Contrast  Result Date: 05/17/2020 CLINICAL DATA:   Status post trauma.  Fall. EXAM: CT HEAD WITHOUT CONTRAST TECHNIQUE: Contiguous axial images were obtained from the base of the skull through the vertex without intravenous contrast. COMPARISON:  Brain MRI 05/12/2020 FINDINGS: Brain: No evidence of acute infarction, hemorrhage, hydrocephalus, extra-axial collection or mass lesion/mass effect. There is mild diffuse low-attenuation within the subcortical and periventricular white matter compatible with chronic microvascular disease. Prominence of the sulci and ventricles compatible with mild brain atrophy. Vascular: No hyperdense vessel or unexpected calcification. Skull: Normal. Negative for fracture or focal lesion. Sinuses/Orbits: No acute finding. Other: None. IMPRESSION: 1. No acute intracranial abnormalities. 2. Chronic small vessel ischemic change and brain atrophy. Electronically Signed   By: Kerby Moors M.D.   On: 05/17/2020 04:10   CT FOOT LEFT WO CONTRAST  Result Date: 05/17/2020 CLINICAL DATA:  Ankle and foot injury question of fracture EXAM: CT OF THE LEFT FOOT WITHOUT CONTRAST TECHNIQUE: Multidetector CT imaging of the left foot was performed according to the standard protocol. Multiplanar CT image reconstructions were also generated. COMPARISON:  None. FINDINGS: Bones/Joint/Cartilage No fracture or dislocation. No widening of  the Lisfranc joint is seen. There is diffuse osteopenia. Small well corticated ossicle seen at the adjacent to the medial malleolus. Mild tibiotalar joint osteoarthritis seen with joint space loss and subchondral sclerosis. First MTP joint osteoarthritis is with joint space. Ligaments Suboptimally assessed by CT. Muscles and Tendons The muscles surrounding the foot are intact without focal atrophy or tear. The flexor and extensor tendons intact. The plantar fascia and Achilles tendon. Soft tissues Soft tissue swelling.  No ankle joint effusion is noted. IMPRESSION: No acute fracture or dislocation the ankle or foot.  Electronically Signed   By: Prudencio Pair M.D.   On: 05/17/2020 21:24   CT ANKLE LEFT WO CONTRAST  Result Date: 05/17/2020 CLINICAL DATA:  Ankle and foot injury question of fracture EXAM: CT OF THE LEFT FOOT WITHOUT CONTRAST TECHNIQUE: Multidetector CT imaging of the left foot was performed according to the standard protocol. Multiplanar CT image reconstructions were also generated. COMPARISON:  None. FINDINGS: Bones/Joint/Cartilage No fracture or dislocation. No widening of the Lisfranc joint is seen. There is diffuse osteopenia. Small well corticated ossicle seen at the adjacent to the medial malleolus. Mild tibiotalar joint osteoarthritis seen with joint space loss and subchondral sclerosis. First MTP joint osteoarthritis is with joint space. Ligaments Suboptimally assessed by CT. Muscles and Tendons The muscles surrounding the foot are intact without focal atrophy or tear. The flexor and extensor tendons intact. The plantar fascia and Achilles tendon. Soft tissues Soft tissue swelling.  No ankle joint effusion is noted. IMPRESSION: No acute fracture or dislocation the ankle or foot. Electronically Signed   By: Prudencio Pair M.D.   On: 05/17/2020 21:24   MR FEMUR LEFT WO CONTRAST  Result Date: 05/17/2020 CLINICAL DATA:  Left femoral neck fracture after fall. History of metastatic lung cancer. EXAM: MR OF THE LEFT FEMUR WITHOUT CONTRAST TECHNIQUE: Multiplanar, multisequence MR imaging of the left femur was performed. No intravenous contrast was administered. COMPARISON:  Left hip x-rays from same day. CT abdomen pelvis dated May 12, 2011. FINDINGS: Bones/Joint/Cartilage Acute mildly displaced left femoral neck fracture with apex anterior angulation again noted. No focal bone lesion. No dislocation. No joint effusion. Muscles and Tendons Mild edema in the bilateral adductor muscles.  No muscle atrophy. Soft tissue No fluid collection or hematoma.  No soft tissue mass. IMPRESSION: 1. Acute mildly displaced  left femoral neck fracture again noted. No underlying bone lesion. Electronically Signed   By: Titus Dubin M.D.   On: 05/17/2020 16:12   DG Foot Complete Left  Result Date: 05/17/2020 CLINICAL DATA:  Initial evaluation for acute trauma, fall. EXAM: LEFT FOOT - COMPLETE 3+ VIEW COMPARISON:  None. FINDINGS: No acute fracture or dislocation. Joint spaces maintained. Mild soft tissue swelling about the midfoot and ankle. 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 IMPRESSION: 1. No acute fracture or dislocation. 2. Mild soft tissue swelling about the midfoot and ankle. Electronically Signed   By: Jeannine Boga M.D.   On: 05/17/2020 04:25   DG Hip Unilat W or Wo Pelvis 2-3 Views Left  Result Date: 05/17/2020 CLINICAL DATA:  Initial evaluation for acute trauma, fall. EXAM: DG HIP (WITH OR WITHOUT PELVIS) 2-3V LEFT COMPARISON:  None. FINDINGS: Acute fracture extends through the left femoral neck with slight superior subluxation. Femoral head remains in normal alignment with the acetabulum. Acetabulum itself appears intact. Bony pelvis intact. Limited views of the right hip demonstrate no acute finding. Underlying osteopenia. Scattered vascular calcifications noted about the inguinal regions bilaterally. IMPRESSION: Acute  mildly displaced fracture of the left femoral neck. Electronically Signed   By: Jeannine Boga M.D.   On: 05/17/2020 04:22    Positive ROS: All other systems have been reviewed and were otherwise negative with the exception of those mentioned in the HPI and as above.  Physical Exam: General: Alert, no acute distress Cardiovascular: No pedal edema Respiratory: No cyanosis, no use of accessory musculature GI: No organomegaly, abdomen is soft and non-tender Skin: No lesions in the area of chief complaint Neurologic: Sensation intact distally Psychiatric: Patient is competent for consent with normal mood and affect Lymphatic: No axillary or cervical  lymphadenopathy  MUSCULOSKELETAL:   Left Wrist: In splint. ROM not assessed.  Left Hip: Skin intact. ROM not assessed. Distal pulses and sensation intact. Moving his ankle and toes without difficulty.   Assessment: Left femoral neck fracture  Plan: Dr. Lyla Glassing discussed the diagnosis of left femoral neck fracture with the patient today, as well as plans for surgical treatment. We will plan for left total hip arthroplasty this morning with Dr. Lyla Glassing. Risks, benefits, and expectations reviewed.     Maurice March, PA-C Cell [phone]   05/18/2020 7:36 AM

## 2020-05-18 NOTE — H&P (View-Only) (Signed)
ORTHOPAEDIC CONSULTATION  REQUESTING PHYSICIAN: Nita Sells, MD  PCP:  Patient, No Pcp Per  Chief Complaint: Fall  HPI: Brandon Castillo is a 69 y.o. male who was recently diagnosed with stage IV cancer. He was discharged from the hospital on Friday, and returned to the [facility] ED that evening after a fall at home. He reports he had moved in with his family upon discharge, and turned the wrong way in the house and fell down 4 steps. He denies LOC, but did hit his head. He reports left wrist, hip, and foot/ankle pain. Imaging in the ED revealed left wrist fracture and left femoral neck fracture. Orthopaedics was consulted for management, and the patient was transferred to [facility] for surgical treatment.  This morning he is resting in bed comfortably. He is not taking blood thinners. He reports he ambulates without assistive devices at home.   Past Medical History:  Diagnosis Date  . Cancer [facility])    Diagnosed yesterday  . Multiple sclerosis ([place])    History reviewed. No pertinent surgical history. Social History   Socioeconomic History  . Marital status: Single    Spouse name: Not on file  . Number of children: Not on file  . Years of education: Not on file  . Highest education level: Not on file  Occupational History  . Not on file  Tobacco Use  . Smoking status: Never Smoker  . Smokeless tobacco: Never Used  Vaping Use  . Vaping Use: Never used  Substance and Sexual Activity  . Alcohol use: Not Currently  . Drug use: Not Currently  . Sexual activity: Not on file  Other Topics Concern  . Not on file  Social History Narrative  . Not on file   Social Determinants of Health   Financial Resource Strain:   . Difficulty of Paying Living Expenses: Not on file  Food Insecurity:   . Worried About Charity fundraiser in the Last Year: Not on file  . Ran Out of Food in the Last Year: Not on file  Transportation Needs:   . Lack of Transportation (Medical):  Not on file  . Lack of Transportation (Non-Medical): Not on file  Physical Activity:   . Days of Exercise per Week: Not on file  . Minutes of Exercise per Session: Not on file  Stress:   . Feeling of Stress : Not on file  Social Connections:   . Frequency of Communication with Friends and Family: Not on file  . Frequency of Social Gatherings with Friends and Family: Not on file  . Attends Religious Services: Not on file  . Active Member of Clubs or Organizations: Not on file  . Attends Archivist Meetings: Not on file  . Marital Status: Not on file   History reviewed. No pertinent family history. Allergies  Allergen Reactions  . Demerol [Meperidine Hcl]    Prior to Admission medications   Medication Sig Start Date End Date Taking? Authorizing Provider  feeding supplement (ENSURE ENLIVE / ENSURE PLUS) LIQD Take 237 mLs by mouth 3 (three) times daily. 05/16/20  Yes Roxan Hockey, MD  HYDROcodone-acetaminophen (NORCO) 10-325 MG tablet Take 1-2 tablets by mouth every 6 (six) hours as needed for severe pain. 05/16/20  Yes Emokpae, Courage, MD  LORazepam (ATIVAN) 1 MG tablet Take 1 tablet (1 mg total) by mouth every 8 (eight) hours as needed for anxiety. 05/16/20  Yes Roxan Hockey, MD  multivitamin-lutein (OCUVITE-LUTEIN) CAPS capsule Take 1  capsule by mouth daily. 05/17/20  Yes Emokpae, Courage, MD  ondansetron (ZOFRAN) 4 MG tablet Take 1 tablet (4 mg total) by mouth every 6 (six) hours as needed for nausea. 05/16/20  Yes Emokpae, Courage, MD  pantoprazole (PROTONIX) 40 MG tablet Take 1 tablet (40 mg total) by mouth daily. 05/17/20  Yes Emokpae, Courage, MD  polyethylene glycol (MIRALAX / GLYCOLAX) 17 g packet Take 17 g by mouth daily. 05/17/20  Yes Emokpae, Courage, MD  senna-docusate (SENOKOT-S) 8.6-50 MG tablet Take 2 tablets by mouth at bedtime. 05/16/20 05/16/21 Yes Roxan Hockey, MD  traZODone (DESYREL) 100 MG tablet Take 0.5-1 tablets (50-100 mg total) by mouth at bedtime.  05/16/20  Yes Roxan Hockey, MD   DG Chest 1 View  Result Date: 05/17/2020 CLINICAL DATA:  Initial evaluation for acute trauma, fall. EXAM: CHEST  1 VIEW COMPARISON:  Prior radiograph from 05/11/2020. FINDINGS: Cardiac and mediastinal silhouettes are stable in size and contour, and may within normal limits. Aortic atherosclerosis. Lungs are hyperinflated with underlying emphysematous changes. No focal infiltrates. No edema or effusion. No pneumothorax. No acute osseous finding. IMPRESSION: 1. No active cardiopulmonary disease. 2. Aortic Atherosclerosis (ICD10-I70.0) and Emphysema (ICD10-J43.9). Electronically Signed   By: Jeannine Boga M.D.   On: 05/17/2020 04:27   DG Wrist Complete Left  Result Date: 05/17/2020 CLINICAL DATA:  Initial evaluation for acute pain and swelling, fall downstairs. EXAM: LEFT WRIST - COMPLETE 3+ VIEW COMPARISON:  None. FINDINGS: Acute comminuted fracture of the distal right radius with slight impaction and dorsal angulation. Associated intra-articular extension. Distal ulna intact. Diffuse soft tissue swelling present about the wrist. IMPRESSION: Acute comminuted fracture of the distal right radius with intra-articular extension and dorsal angulation. Electronically Signed   By: Jeannine Boga M.D.   On: 05/17/2020 04:19   DG Ankle Complete Left  Result Date: 05/17/2020 CLINICAL DATA:  Initial evaluation for acute trauma, fall. EXAM: LEFT ANKLE COMPLETE - 3+ VIEW COMPARISON:  None. FINDINGS: No acute fracture dislocation. Ankle mortise approximated. Corticated osseous density at the medial malleolus consistent with a chronic finding. Diffuse soft tissue swelling present about the ankle. Underlying osteoarthritic changes noted. IMPRESSION: 1. No acute fracture or dislocation. 2. Diffuse soft tissue swelling about the ankle. Electronically Signed   By: Jeannine Boga M.D.   On: 05/17/2020 04:21   CT Head Wo Contrast  Result Date: 05/17/2020 CLINICAL DATA:   Status post trauma.  Fall. EXAM: CT HEAD WITHOUT CONTRAST TECHNIQUE: Contiguous axial images were obtained from the base of the skull through the vertex without intravenous contrast. COMPARISON:  Brain MRI 05/12/2020 FINDINGS: Brain: No evidence of acute infarction, hemorrhage, hydrocephalus, extra-axial collection or mass lesion/mass effect. There is mild diffuse low-attenuation within the subcortical and periventricular white matter compatible with chronic microvascular disease. Prominence of the sulci and ventricles compatible with mild brain atrophy. Vascular: No hyperdense vessel or unexpected calcification. Skull: Normal. Negative for fracture or focal lesion. Sinuses/Orbits: No acute finding. Other: None. IMPRESSION: 1. No acute intracranial abnormalities. 2. Chronic small vessel ischemic change and brain atrophy. Electronically Signed   By: Kerby Moors M.D.   On: 05/17/2020 04:10   CT FOOT LEFT WO CONTRAST  Result Date: 05/17/2020 CLINICAL DATA:  Ankle and foot injury question of fracture EXAM: CT OF THE LEFT FOOT WITHOUT CONTRAST TECHNIQUE: Multidetector CT imaging of the left foot was performed according to the standard protocol. Multiplanar CT image reconstructions were also generated. COMPARISON:  None. FINDINGS: Bones/Joint/Cartilage No fracture or dislocation. No widening of  the Lisfranc joint is seen. There is diffuse osteopenia. Small well corticated ossicle seen at the adjacent to the medial malleolus. Mild tibiotalar joint osteoarthritis seen with joint space loss and subchondral sclerosis. First MTP joint osteoarthritis is with joint space. Ligaments Suboptimally assessed by CT. Muscles and Tendons The muscles surrounding the foot are intact without focal atrophy or tear. The flexor and extensor tendons intact. The plantar fascia and Achilles tendon. Soft tissues Soft tissue swelling.  No ankle joint effusion is noted. IMPRESSION: No acute fracture or dislocation the ankle or foot.  Electronically Signed   By: Prudencio Pair M.D.   On: 05/17/2020 21:24   CT ANKLE LEFT WO CONTRAST  Result Date: 05/17/2020 CLINICAL DATA:  Ankle and foot injury question of fracture EXAM: CT OF THE LEFT FOOT WITHOUT CONTRAST TECHNIQUE: Multidetector CT imaging of the left foot was performed according to the standard protocol. Multiplanar CT image reconstructions were also generated. COMPARISON:  None. FINDINGS: Bones/Joint/Cartilage No fracture or dislocation. No widening of the Lisfranc joint is seen. There is diffuse osteopenia. Small well corticated ossicle seen at the adjacent to the medial malleolus. Mild tibiotalar joint osteoarthritis seen with joint space loss and subchondral sclerosis. First MTP joint osteoarthritis is with joint space. Ligaments Suboptimally assessed by CT. Muscles and Tendons The muscles surrounding the foot are intact without focal atrophy or tear. The flexor and extensor tendons intact. The plantar fascia and Achilles tendon. Soft tissues Soft tissue swelling.  No ankle joint effusion is noted. IMPRESSION: No acute fracture or dislocation the ankle or foot. Electronically Signed   By: Prudencio Pair M.D.   On: 05/17/2020 21:24   MR FEMUR LEFT WO CONTRAST  Result Date: 05/17/2020 CLINICAL DATA:  Left femoral neck fracture after fall. History of metastatic lung cancer. EXAM: MR OF THE LEFT FEMUR WITHOUT CONTRAST TECHNIQUE: Multiplanar, multisequence MR imaging of the left femur was performed. No intravenous contrast was administered. COMPARISON:  Left hip x-rays from same day. CT abdomen pelvis dated May 12, 2011. FINDINGS: Bones/Joint/Cartilage Acute mildly displaced left femoral neck fracture with apex anterior angulation again noted. No focal bone lesion. No dislocation. No joint effusion. Muscles and Tendons Mild edema in the bilateral adductor muscles.  No muscle atrophy. Soft tissue No fluid collection or hematoma.  No soft tissue mass. IMPRESSION: 1. Acute mildly displaced  left femoral neck fracture again noted. No underlying bone lesion. Electronically Signed   By: Titus Dubin M.D.   On: 05/17/2020 16:12   DG Foot Complete Left  Result Date: 05/17/2020 CLINICAL DATA:  Initial evaluation for acute trauma, fall. EXAM: LEFT FOOT - COMPLETE 3+ VIEW COMPARISON:  None. FINDINGS: No acute fracture or dislocation. Joint spaces maintained. Mild soft tissue swelling about the midfoot and ankle. 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 IMPRESSION: 1. No acute fracture or dislocation. 2. Mild soft tissue swelling about the midfoot and ankle. Electronically Signed   By: Jeannine Boga M.D.   On: 05/17/2020 04:25   DG Hip Unilat W or Wo Pelvis 2-3 Views Left  Result Date: 05/17/2020 CLINICAL DATA:  Initial evaluation for acute trauma, fall. EXAM: DG HIP (WITH OR WITHOUT PELVIS) 2-3V LEFT COMPARISON:  None. FINDINGS: Acute fracture extends through the left femoral neck with slight superior subluxation. Femoral head remains in normal alignment with the acetabulum. Acetabulum itself appears intact. Bony pelvis intact. Limited views of the right hip demonstrate no acute finding. Underlying osteopenia. Scattered vascular calcifications noted about the inguinal regions bilaterally. IMPRESSION: Acute  mildly displaced fracture of the left femoral neck. Electronically Signed   By: Jeannine Boga M.D.   On: 05/17/2020 04:22    Positive ROS: All other systems have been reviewed and were otherwise negative with the exception of those mentioned in the HPI and as above.  Physical Exam: General: Alert, no acute distress Cardiovascular: No pedal edema Respiratory: No cyanosis, no use of accessory musculature GI: No organomegaly, abdomen is soft and non-tender Skin: No lesions in the area of chief complaint Neurologic: Sensation intact distally Psychiatric: Patient is competent for consent with normal mood and affect Lymphatic: No axillary or cervical  lymphadenopathy  MUSCULOSKELETAL:   Left Wrist: In splint. ROM not assessed.  Left Hip: Skin intact. ROM not assessed. Distal pulses and sensation intact. Moving his ankle and toes without difficulty.   Assessment: Left femoral neck fracture  Plan: Dr. Lyla Glassing discussed the diagnosis of left femoral neck fracture with the patient today, as well as plans for surgical treatment. We will plan for left total hip arthroplasty this morning with Dr. Lyla Glassing. Risks, benefits, and expectations reviewed.     Maurice March, PA-C Cell [phone]   05/18/2020 7:36 AM

## 2020-05-18 NOTE — Progress Notes (Signed)
Patient going to the OR.

## 2020-05-19 ENCOUNTER — Encounter (HOSPITAL_COMMUNITY): Payer: Self-pay | Admitting: Internal Medicine

## 2020-05-19 ENCOUNTER — Other Ambulatory Visit (HOSPITAL_COMMUNITY): Payer: Self-pay

## 2020-05-19 DIAGNOSIS — C3491 Malignant neoplasm of unspecified part of right bronchus or lung: Secondary | ICD-10-CM

## 2020-05-19 LAB — COMPREHENSIVE METABOLIC PANEL WITH GFR
ALT: 39 U/L (ref 0–44)
AST: 31 U/L (ref 15–41)
Albumin: 2.6 g/dL — ABNORMAL LOW (ref 3.5–5.0)
Alkaline Phosphatase: 103 U/L (ref 38–126)
Anion gap: 9 (ref 5–15)
BUN: 23 mg/dL (ref 8–23)
CO2: 25 mmol/L (ref 22–32)
Calcium: 8.9 mg/dL (ref 8.9–10.3)
Chloride: 103 mmol/L (ref 98–111)
Creatinine, Ser: 0.76 mg/dL (ref 0.61–1.24)
GFR, Estimated: >60 mL/min
Glucose, Bld: 119 mg/dL — ABNORMAL HIGH (ref 70–99)
Potassium: 4.4 mmol/L (ref 3.5–5.1)
Sodium: 137 mmol/L (ref 135–145)
Total Bilirubin: 0.5 mg/dL (ref 0.3–1.2)
Total Protein: 5.5 g/dL — ABNORMAL LOW (ref 6.5–8.1)

## 2020-05-19 LAB — GLUCOSE, CAPILLARY
Glucose-Capillary: 107 mg/dL — ABNORMAL HIGH (ref 70–99)
Glucose-Capillary: 119 mg/dL — ABNORMAL HIGH (ref 70–99)
Glucose-Capillary: 131 mg/dL — ABNORMAL HIGH (ref 70–99)
Glucose-Capillary: 175 mg/dL — ABNORMAL HIGH (ref 70–99)

## 2020-05-19 LAB — CBC
HCT: 26.1 % — ABNORMAL LOW (ref 39.0–52.0)
Hemoglobin: 8.3 g/dL — ABNORMAL LOW (ref 13.0–17.0)
MCH: 32.3 pg (ref 26.0–34.0)
MCHC: 31.8 g/dL (ref 30.0–36.0)
MCV: 101.6 fL — ABNORMAL HIGH (ref 80.0–100.0)
Platelets: 281 10*3/uL (ref 150–400)
RBC: 2.57 MIL/uL — ABNORMAL LOW (ref 4.22–5.81)
RDW: 12.3 % (ref 11.5–15.5)
WBC: 16.3 10*3/uL — ABNORMAL HIGH (ref 4.0–10.5)
nRBC: 0 % (ref 0.0–0.2)

## 2020-05-19 LAB — SURGICAL PATHOLOGY

## 2020-05-19 NOTE — TOC CAGE-AID Note (Signed)
Transition of Care [facility]) - CAGE-AID Screening   Patient Details  Name: Brandon Castillo MRN: [id number] Date of Birth: 04/27/1951  Transition of Care [facility]) CM/SW Contact:    Emeterio Reeve, [place] Phone Number: 05/19/2020, 4:34 PM   Clinical Narrative:  CSW met with pt at bedside. CSW introduced self and explained role at the hospital.  Pt denies alcohol use. Pt denies substance use. Pt did not need any resources at this time.    CAGE-AID Screening:    Have You Ever Felt You Ought to Cut Down on Your Drinking or Drug Use?: No Have People Annoyed You By Critizing Your Drinking Or Drug Use?: No Have You Felt Bad Or Guilty About Your Drinking Or Drug Use?: No Have You Ever Had a Drink or Used Drugs First Thing In The Morning to Steady Your Nerves or to Get Rid of a Hangover?: No CAGE-AID Score: 0  Substance Abuse Education Offered: Yes    Blima Ledger, [place] Social Worker [phone]

## 2020-05-19 NOTE — Progress Notes (Signed)
The patient is aware that port will be placed tomorrow 11/09

## 2020-05-19 NOTE — Progress Notes (Signed)
Chief Complaint: Patient was seen in consultation today for lung cancer  Referring Physician(s): Dr. Verlon Au  Supervising Physician: Aletta Edouard  Patient Status: [facility] - In-pt  History of Present Illness: Brandon Castillo is a 69 y.o. 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  He is known to IR from recent liver biopsy confirming metastatic disease on 05/15/20.  IR now consulted for Port-A-Cath placement at the request of Oncology and Dr. Verlon Au.   Patient assessed at bedside.  He is resting comfortably, very talkative. Pleased with the progress of his hospitalization and treatment for injuries s/p fall. He understands the reasoning for Port-A-Cath placement and is in agreement with procedure.   Past Medical History:  Diagnosis Date  . [facility])    Diagnosed yesterday  . Multiple sclerosis ([place])     History reviewed. No pertinent surgical history.  Allergies: Demerol [meperidine hcl]  Medications: Prior to Admission medications   Medication Sig Start Date End Date Taking? Authorizing Provider  feeding supplement (ENSURE ENLIVE / ENSURE PLUS) LIQD Take 237 mLs by mouth 3 (three) times daily. 05/16/20  Yes Roxan Hockey, MD  HYDROcodone-acetaminophen (NORCO) 10-325 MG tablet Take 1-2 tablets by mouth every 6 (six) hours as needed for severe pain. 05/16/20  Yes Emokpae, Courage, MD  LORazepam (ATIVAN) 1 MG tablet Take 1 tablet (1 mg total) by mouth every 8 (eight) hours as needed for anxiety. 05/16/20  Yes Emokpae, Courage, MD  multivitamin-lutein (OCUVITE-LUTEIN) CAPS capsule Take 1 capsule by mouth daily. 05/17/20  Yes Emokpae, Courage, MD  ondansetron (ZOFRAN) 4 MG tablet Take 1 tablet (4 mg total) by mouth every 6 (six) hours as needed for nausea. 05/16/20  Yes Emokpae, Courage, MD  pantoprazole (PROTONIX) 40 MG tablet Take 1 tablet (40 mg  total) by mouth daily. 05/17/20  Yes Emokpae, Courage, MD  polyethylene glycol (MIRALAX / GLYCOLAX) 17 g packet Take 17 g by mouth daily. 05/17/20  Yes Emokpae, Courage, MD  senna-docusate (SENOKOT-S) 8.6-50 MG tablet Take 2 tablets by mouth at bedtime. 05/16/20 05/16/21 Yes Roxan Hockey, MD  traZODone (DESYREL) 100 MG tablet Take 0.5-1 tablets (50-100 mg total) by mouth at bedtime. 05/16/20  Yes Roxan Hockey, MD     History reviewed. No pertinent family history.  Social History   Socioeconomic History  . Marital status: Single    Spouse name: Not on file  . Number of children: Not on file  . Years of education: Not on file  . Highest education level: Not on file  Occupational History  . Not on file  Tobacco Use  . Smoking status: Never Smoker  . Smokeless tobacco: Never Used  Vaping Use  . Vaping Use: Never used  Substance and Sexual Activity  . Alcohol use: Not Currently  . Drug use: Not Currently  . Sexual activity: Not on file  Other Topics Concern  . Not on file  Social History Narrative  . Not on file   Social Determinants of Health   Financial Resource Strain:   . Difficulty of Paying Living Expenses: Not on file  Food Insecurity:   . Worried About Charity fundraiser in the Last Year: Not on file  . Ran Out of Food in the Last Year: Not on file  Transportation Needs:   . Lack of Transportation (Medical): Not on file  . Lack of Transportation (Non-Medical): Not on file  Physical Activity:   . Days of Exercise per Week: Not on file  . Minutes of Exercise per Session: Not on file  Stress:   . Feeling of Stress : Not on file  Social Connections:   . Frequency of Communication with Friends and Family: Not on file  . Frequency of Social Gatherings with Friends and Family: Not on file  . Attends Religious Services: Not on file  . Active Member of Clubs or Organizations: Not on file  . Attends Archivist Meetings: Not on file  . Marital Status: Not on  file     Review of Systems: A 12 point ROS discussed and pertinent positives are indicated in the HPI above.  All other systems are negative.  Review of Systems  Constitutional: Negative for fatigue and fever.  Respiratory: Negative for cough and shortness of breath.   Cardiovascular: Negative for chest pain.  Gastrointestinal: Negative for abdominal pain.  Musculoskeletal: Positive for back pain.  Psychiatric/Behavioral: Negative for behavioral problems and confusion.    Vital Signs: BP 118/80 (BP Location: Right Arm)   Pulse 88   Temp 98.7 F (37.1 C) (Oral)   Resp 17   Ht 5' 9"  (1.753 m)   Wt 149 lb 14.6 oz (68 kg)   SpO2 100%   BMI 22.14 kg/m   Physical Exam Vitals and nursing note reviewed.  Constitutional:      General: He is not in acute distress.    Appearance: Normal appearance. He is not ill-appearing.  HENT:     Mouth/Throat:     Mouth: Mucous membranes are moist.     Pharynx: Oropharynx is clear.  Cardiovascular:     Rate and Rhythm: Normal rate and regular rhythm.  Pulmonary:     Effort: Pulmonary effort is normal. No respiratory distress.     Breath sounds: Normal breath sounds.  Abdominal:     General: Abdomen is flat. There is no distension.     Palpations: Abdomen is soft.  Skin:    General: Skin is warm and dry.  Neurological:     General: No focal deficit present.     Mental Status: He is alert and oriented to person, place, and time. Mental status is at baseline.  Psychiatric:        Mood and Affect: Mood normal.        Behavior: Behavior normal.        Thought Content: Thought content normal.        Judgment: Judgment normal.      MD Evaluation Airway: WNL Heart: WNL Abdomen: WNL Chest/ Lungs: WNL ASA  Classification: 3 Mallampati/Airway Score: One   Imaging: DG Chest 1 View  Result Date: 05/17/2020 CLINICAL DATA:  Initial evaluation for acute trauma, fall. EXAM: CHEST  1 VIEW COMPARISON:  Prior radiograph from 05/11/2020.  FINDINGS: Cardiac and mediastinal silhouettes are stable in size and contour, and may within normal limits. Aortic atherosclerosis. Lungs are hyperinflated with underlying emphysematous changes. No focal infiltrates. No edema or effusion. No pneumothorax. No acute osseous finding. IMPRESSION: 1. No active cardiopulmonary disease. 2. Aortic Atherosclerosis (ICD10-I70.0) and Emphysema (ICD10-J43.9). Electronically Signed   By: Jeannine Boga M.D.   On: 05/17/2020 04:27   DG Chest 2 View  Result Date: 05/11/2020 CLINICAL DATA:  Chest pain. EXAM: CHEST - 2 VIEW COMPARISON:  None. FINDINGS: The heart is normal in size. Mild tortuosity and calcification of the thoracic aorta. Slight ill-defined density in the right suprahilar/right upper lobe region.  This is indeterminate finding but chest CT suggested to further evaluate. Lungs demonstrate hyperinflation and emphysematous changes. No pleural effusions. IMPRESSION: 1. Ill-defined right suprahilar/right upper lobe density. Chest CT suggested for further evaluate. 2. Underlying emphysematous changes. Electronically Signed   By: Marijo Sanes M.D.   On: 05/11/2020 13:26   DG Wrist Complete Left  Result Date: 05/17/2020 CLINICAL DATA:  Initial evaluation for acute pain and swelling, fall downstairs. EXAM: LEFT WRIST - COMPLETE 3+ VIEW COMPARISON:  None. FINDINGS: Acute comminuted fracture of the distal right radius with slight impaction and dorsal angulation. Associated intra-articular extension. Distal ulna intact. Diffuse soft tissue swelling present about the wrist. IMPRESSION: Acute comminuted fracture of the distal right radius with intra-articular extension and dorsal angulation. Electronically Signed   By: Jeannine Boga M.D.   On: 05/17/2020 04:19   DG Ankle Complete Left  Result Date: 05/17/2020 CLINICAL DATA:  Initial evaluation for acute trauma, fall. EXAM: LEFT ANKLE COMPLETE - 3+ VIEW COMPARISON:  None. FINDINGS: No acute fracture  dislocation. Ankle mortise approximated. Corticated osseous density at the medial malleolus consistent with a chronic finding. Diffuse soft tissue swelling present about the ankle. Underlying osteoarthritic changes noted. IMPRESSION: 1. No acute fracture or dislocation. 2. Diffuse soft tissue swelling about the ankle. Electronically Signed   By: Jeannine Boga M.D.   On: 05/17/2020 04:21   CT Head Wo Contrast  Result Date: 05/17/2020 CLINICAL DATA:  Status post trauma.  Fall. EXAM: CT HEAD WITHOUT CONTRAST TECHNIQUE: Contiguous axial images were obtained from the base of the skull through the vertex without intravenous contrast. COMPARISON:  Brain MRI 05/12/2020 FINDINGS: Brain: No evidence of acute infarction, hemorrhage, hydrocephalus, extra-axial collection or mass lesion/mass effect. There is mild diffuse low-attenuation within the subcortical and periventricular white matter compatible with chronic microvascular disease. Prominence of the sulci and ventricles compatible with mild brain atrophy. Vascular: No hyperdense vessel or unexpected calcification. Skull: Normal. Negative for fracture or focal lesion. Sinuses/Orbits: No acute finding. Other: None. IMPRESSION: 1. No acute intracranial abnormalities. 2. Chronic small vessel ischemic change and brain atrophy. Electronically Signed   By: Kerby Moors M.D.   On: 05/17/2020 04:10   CT Chest Wo Contrast  Result Date: 05/11/2020 CLINICAL DATA:  Abnormal chest x-ray. EXAM: CT CHEST WITHOUT CONTRAST TECHNIQUE: Multidetector CT imaging of the chest was performed following the standard protocol without IV contrast. COMPARISON:  Chest x-ray, same date. FINDINGS: Cardiovascular: The heart is normal in size. No pericardial effusion. Mild tortuosity and ectasia of the thoracic aorta with scattered atherosclerotic calcifications. Scattered coronary artery calcifications. Mediastinum/Nodes: Right upper lobe lung mass is invading the right hilum and right  mediastinum. I do not see any discrete measurable mediastinal lymph nodes. The esophagus is grossly normal. Lungs/Pleura: 3.2 cm spiculated right upper lobe lung mass medially invading the right hilum and the right mediastinum. Findings consistent with primary lung neoplasm. Advanced emphysematous changes and areas of pulmonary scarring. There are several scattered subpleural pulmonary nodules noted which are indeterminate. None of these measures more than 5 mm. Metastatic disease is certainly a consideration. No acute pulmonary findings.  No pleural effusions. Upper Abdomen: Bilateral adrenal gland nodules and upper pole right renal lesion as seen on today's abdominal/pelvic CT scan. Musculoskeletal: No worrisome bone lesions to suggest metastatic bone disease. IMPRESSION: 1. 3.2 cm spiculated right upper lobe lung mass invading the right hilum and right mediastinum consistent with primary lung neoplasm. 2. Several scattered subpleural pulmonary nodules are indeterminate. Metastatic disease is certainly  a consideration. 3. Advanced emphysematous changes and pulmonary scarring. 4. Bilateral adrenal gland nodules and upper pole right renal lesion as seen on today's abdominal/pelvic CT scan. 5. No findings for osseous metastatic disease. 6. Emphysema and aortic atherosclerosis. Aortic Atherosclerosis (ICD10-I70.0) and Emphysema (ICD10-J43.9). Electronically Signed   By: Marijo Sanes M.D.   On: 05/11/2020 14:26   MR BRAIN W WO CONTRAST  Result Date: 05/12/2020 CLINICAL DATA:  Lung mass EXAM: MRI HEAD WITHOUT AND WITH CONTRAST TECHNIQUE: Multiplanar, multiecho pulse sequences of the brain and surrounding structures were obtained without and with intravenous contrast. CONTRAST:  19m GADAVIST GADOBUTROL 1 MMOL/ML IV SOLN COMPARISON:  None. FINDINGS: Brain: There is no acute infarction or intracranial hemorrhage. There is no intracranial mass, mass effect, or edema. There is no hydrocephalus or extra-axial fluid  collection. Prominence of the ventricles and sulci reflects minor generalized parenchymal volume loss. There is an incidental small right frontal developmental venous anomaly. 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 Patchy T2 hyperintensity in the supratentorial white matter is nonspecific but may reflect minor chronic microvascular ischemic changes. Vascular: Major vessel flow voids at the skull base are preserved. Skull and upper cervical spine: Normal marrow signal is preserved. Sinuses/Orbits: Paranasal sinuses are aerated. Orbits are unremarkable. Other: Sella is unremarkable.  Mastoid air cells are clear. IMPRESSION: No evidence of intracranial metastatic disease. Chronic/nonemergent findings detailed above. Electronically Signed   By: PMacy MisM.D.   On: 05/12/2020 10:19   MR ABDOMEN W WO CONTRAST  Result Date: 05/14/2020 CLINICAL DATA:  Evaluate for liver metastases. EXAM: MRI ABDOMEN WITHOUT AND WITH CONTRAST TECHNIQUE: Multiplanar multisequence MR imaging of the abdomen was performed both before and after the administration of intravenous contrast. CONTRAST:  734mGADAVIST GADOBUTROL 1 MMOL/ML IV SOLN COMPARISON:  CT AP 05/11/2020 FINDINGS: Lower chest: No acute findings. Hepatobiliary: Within segment 5 there is a T1 hypointense and mildly T2 hyperintense lesion adjacent to the gallbladder fossa which shows peripheral enhancement consistent with a metastatic lesion. This measures approximately 2.1 x 1.7 cm, image 49/16. Within the subcapsular aspect of the posterior right hepatic lobe there is a 1.3 x 0.7 cm T2 hyperintense and T1 hypointense structure without corresponding enhancement which is favored to represent a benign abnormality. The gallbladder is unremarkable. No bile duct dilatation identified. Pancreas: No mass, inflammatory changes, or other parenchymal abnormality identified. Spleen:  Within normal limits in size and appearance.  Adrenals/Urinary Tract:  Normal appearance of the adrenal glands. Again seen are multiple peripherally enhancing right kidney lesions. The dominant lesion arises from the inferior pole of the right kidney measuring 5.1 x 5.4 cm, image 65/6. This is T1 hypointense, T2 isointense with mild peripheral enhancement. Similarly, arising from the medial cortex of the upper pole there is a irregular mass measuring 3.4 x 2.5 cm which is T2 isointense, T1 hypointense, with peripheral enhancement. This lesion also contains a thin internal area of linear enhancement, image 48/16. Also arising from the upper pole of the right kidney is a small peripherally enhancing lesion which is T1 hypointense, teen 2 hyperintense and measures 1.9 x 1.5 cm, image 49/16. Small simple appearing cyst is identified involving the lateral cortex of the right kidney measuring 0.5 cm, image 18/4. 2 distinct lesions are noted arising from the upper pole of left kidney which have similar signal and enhancement characteristics to the suspicious right kidney lesions, image 34/18. The largest of these measures 1.5 cm. Stomach/Bowel: Visualized portions within the abdomen are unremarkable. Vascular/Lymphatic:  No signs of renal vein involvement bilaterally. The IVC is patent without evidence for tumor thrombus. Aortic atherosclerosis. No aneurysm. No abdominal adenopathy. Other: No ascites or focal fluid collections identified within the imaged portions of the upper abdomen. Musculoskeletal: No suspicious bone lesions identified. IMPRESSION: 1. Again seen are multiple, bilateral peripherally enhancing kidney lesions. In the setting of a suspicious lung mass, the multiplicity, signal and enhancement characteristics of these lesions favor metastatic disease. Differential considerations include multiple cystic renal cell carcinomas versus multiple liver metastases. 2. Liver lesion adjacent to the gallbladder fossa has signal and enhancement characteristics  favoring a metastatic lesion. Electronically Signed   By: Kerby Moors M.D.   On: 05/14/2020 05:11   CT Abdomen Pelvis W Contrast  Addendum Date: 05/11/2020   ADDENDUM REPORT: 05/11/2020 13:43 ADDENDUM: Of note, an alternative differential consideration for the bilateral renal masses is renal abscesses. The hypoechoic mass in the liver could represent a benign hemangioma, and MR abdomen according could be considered for further evaluation as part of the patient's workup. These results were called by telephone at the time of interpretation on 05/11/2020 at 1:42 pm to provider [facility] , who verbally acknowledged these results. Electronically Signed   By: Zerita Boers M.D.   On: 05/11/2020 13:43   Result Date: 05/11/2020 CLINICAL DATA:  Lower abdominal pain and generalized weakness for 2 weeks. EXAM: CT ABDOMEN AND PELVIS WITH CONTRAST TECHNIQUE: Multidetector CT imaging of the abdomen and pelvis was performed using the standard protocol following bolus administration of intravenous contrast. CONTRAST:  87m OMNIPAQUE IOHEXOL 300 MG/ML  SOLN COMPARISON:  Report from abdominal radiograph dated 03/05/2002. FINDINGS: Lower chest: No acute abnormality. Hepatobiliary: There is a 1.6 cm hypoattenuating mass in hepatic segment 5/4B near the gallbladder fossa. No gallstones, gallbladder wall thickening, or biliary dilatation. Pancreas: Unremarkable. No pancreatic ductal dilatation or surrounding inflammatory changes. Spleen: Normal in size without focal abnormality. Adrenals/Urinary Tract: Two left adrenal nodules measure approximately 0.8 and 0.9 cm (series 5, image 49 and image 46). The right adrenal gland appears normal. Multiple indistinct hypoechoic masses enlarged the right kidney with the largest measuring 6.2 cm in diameter. A mass is seen in the right renal pelvis, measuring 2.9 cm (series 7, image 16). At least two hypoechoic masses are seen in the left kidney, measuring up to 1.7 cm. A 7 mm mass in  the lateral right kidney likely represents a benign cyst (series 2, image 33). There is no hydronephrosis. Bladder is unremarkable. Stomach/Bowel: Stomach is within normal limits. Appendix appears normal. There is colonic diverticulosis without evidence of diverticulitis. No evidence of bowel wall thickening, distention, or inflammatory changes. Vascular/Lymphatic: Aortic atherosclerosis. The inferior vena cava and both renal veins appear normal. No enlarged abdominal or pelvic lymph nodes. Reproductive: The prostate is mildly enlarged, measuring 5.1 cm in transverse dimension Other: No abdominal wall hernia or abnormality. No abdominopelvic ascites. Musculoskeletal: Degenerative changes are seen in the spine. IMPRESSION: 1. Multiple hypoechoic masses in both kidneys as well as a mass in the right renal pelvis. Differential considerations include renal cell carcinoma, transitional cell carcinoma, renal lymphoma, and metastatic disease. Indeterminate hypoattenuating mass in the liver and two left adrenal nodules may represent metastatic disease. CT abdomen pelvis according to a renal protocol could be considered for further evaluation. Aortic Atherosclerosis (ICD10-I70.0). Electronically Signed: By: TZerita BoersM.D. On: 05/11/2020 12:28   Pelvis Portable  Result Date: 05/18/2020 CLINICAL DATA:  Left total hip arthroplasty EXAM: PORTABLE PELVIS 1-2 VIEWS COMPARISON:  05/17/2020 left hip radiographs FINDINGS: Interval left total hip arthroplasty with no evidence of hardware fracture or loosening. No evidence of hip dislocation on these frontal views. No osseous fracture. No pelvic diastasis. No focal osseous lesions. Expected soft tissue gas surrounding the left hip. IMPRESSION: Satisfactory immediate postoperative frontal view appearance status post left total hip arthroplasty. Electronically Signed   By: Ilona Sorrel M.D.   On: 05/18/2020 15:44   CT FOOT LEFT WO CONTRAST  Result Date: 05/17/2020 CLINICAL  DATA:  Ankle and foot injury question of fracture EXAM: CT OF THE LEFT FOOT WITHOUT CONTRAST TECHNIQUE: Multidetector CT imaging of the left foot was performed according to the standard protocol. Multiplanar CT image reconstructions were also generated. COMPARISON:  None. FINDINGS: Bones/Joint/Cartilage No fracture or dislocation. No widening of the Lisfranc joint is seen. There is diffuse osteopenia. Small well corticated ossicle seen at the adjacent to the medial malleolus. Mild tibiotalar joint osteoarthritis seen with joint space loss and subchondral sclerosis. First MTP joint osteoarthritis is with joint space. Ligaments Suboptimally assessed by CT. Muscles and Tendons The muscles surrounding the foot are intact without focal atrophy or tear. The flexor and extensor tendons intact. The plantar fascia and Achilles tendon. Soft tissues Soft tissue swelling.  No ankle joint effusion is noted. IMPRESSION: No acute fracture or dislocation the ankle or foot. Electronically Signed   By: Prudencio Pair M.D.   On: 05/17/2020 21:24   CT ANKLE LEFT WO CONTRAST  Result Date: 05/17/2020 CLINICAL DATA:  Ankle and foot injury question of fracture EXAM: CT OF THE LEFT FOOT WITHOUT CONTRAST TECHNIQUE: Multidetector CT imaging of the left foot was performed according to the standard protocol. Multiplanar CT image reconstructions were also generated. COMPARISON:  None. FINDINGS: Bones/Joint/Cartilage No fracture or dislocation. No widening of the Lisfranc joint is seen. There is diffuse osteopenia. Small well corticated ossicle seen at the adjacent to the medial malleolus. Mild tibiotalar joint osteoarthritis seen with joint space loss and subchondral sclerosis. First MTP joint osteoarthritis is with joint space. Ligaments Suboptimally assessed by CT. Muscles and Tendons The muscles surrounding the foot are intact without focal atrophy or tear. The flexor and extensor tendons intact. The plantar fascia and Achilles tendon. Soft  tissues Soft tissue swelling.  No ankle joint effusion is noted. IMPRESSION: No acute fracture or dislocation the ankle or foot. Electronically Signed   By: Prudencio Pair M.D.   On: 05/17/2020 21:24   MR FEMUR LEFT WO CONTRAST  Result Date: 05/17/2020 CLINICAL DATA:  Left femoral neck fracture after fall. History of metastatic lung cancer. EXAM: MR OF THE LEFT FEMUR WITHOUT CONTRAST TECHNIQUE: Multiplanar, multisequence MR imaging of the left femur was performed. No intravenous contrast was administered. COMPARISON:  Left hip x-rays from same day. CT abdomen pelvis dated May 12, 2011. FINDINGS: Bones/Joint/Cartilage Acute mildly displaced left femoral neck fracture with apex anterior angulation again noted. No focal bone lesion. No dislocation. No joint effusion. Muscles and Tendons Mild edema in the bilateral adductor muscles.  No muscle atrophy. Soft tissue No fluid collection or hematoma.  No soft tissue mass. IMPRESSION: 1. Acute mildly displaced left femoral neck fracture again noted. No underlying bone lesion. Electronically Signed   By: Titus Dubin M.D.   On: 05/17/2020 16:12   US BIOPSY (LIVER)  Result Date: 05/15/2020 INDICATION: Right hilar mass, liver lesion adjacent to the gallbladder EXAM: ULTRASOUND GUIDED CORE BIOPSY OF RIGHT LIVER LESION MEDICATIONS: 1% LIDOCAINE LOCAL ANESTHESIA/SEDATION: Fentanyl 50 mcg IV; Versed 1.0  mg IV Moderate Sedation Time:  10 MINUTES The patient was continuously monitored during the procedure by the interventional radiology nurse under my direct supervision. PROCEDURE: The procedure, risks, benefits, and alternatives were explained to the patient. Questions regarding the procedure were encouraged and answered. The patient understands and consents to the procedure. previous imaging reviewed. preliminary ultrasound performed. the solid hypoechoic lesion adjacent to the gallbladder in the right lobe was localized and marked. this is well below the subcostal  margin. Under sterile conditions and local anesthesia, a 17 gauge 6.8 cm access was advanced to the lesion under direct ultrasound. Needle position confirmed with ultrasound. 3 18 gauge core biopsies obtained of the lesion under direct ultrasound. Samples were intact and non fragmented. These were placed in formalin. Needle tract occluded with Gel-Foam. Postprocedure imaging demonstrates no hemorrhage or hematoma. Patient tolerated biopsy well. COMPLICATIONS: None immediate. FINDINGS: Imaging confirms needle placed into the right liver lesion adjacent to the gallbladder for core biopsy IMPRESSION: Successful ultrasound right liver lesion 18 gauge core biopsy Electronically Signed   By: Jerilynn Mages.  Shick M.D.   On: 05/15/2020 15:56   US Venous Img Lower Bilateral (DVT)  Result Date: 05/12/2020 CLINICAL DATA:  Bilateral lower extremity pain.  Evaluate for DVT. EXAM: BILATERAL LOWER EXTREMITY VENOUS DOPPLER ULTRASOUND TECHNIQUE: Gray-scale sonography with graded compression, as well as color Doppler and duplex ultrasound were performed to evaluate the lower extremity deep venous systems from the level of the common femoral vein and including the common femoral, femoral, profunda femoral, popliteal and calf veins including the posterior tibial, peroneal and gastrocnemius veins when visible. The superficial great saphenous vein was also interrogated. Spectral Doppler was utilized to evaluate flow at rest and with distal augmentation maneuvers in the common femoral, femoral and popliteal veins. COMPARISON:  None. FINDINGS: RIGHT LOWER EXTREMITY Common Femoral Vein: No evidence of thrombus. Normal compressibility, respiratory phasicity and response to augmentation. Saphenofemoral Junction: No evidence of thrombus. Normal compressibility and flow on color Doppler imaging. Profunda Femoral Vein: No evidence of thrombus. Normal compressibility and flow on color Doppler imaging. Femoral Vein: No evidence of thrombus. Normal  compressibility, respiratory phasicity and response to augmentation. Popliteal Vein: No evidence of thrombus. Normal compressibility, respiratory phasicity and response to augmentation. Calf Veins: No evidence of thrombus. Normal compressibility and flow on color Doppler imaging. Superficial Great Saphenous Vein: No evidence of thrombus. Normal compressibility. Venous Reflux:  None. Other Findings:  None. LEFT LOWER EXTREMITY Common Femoral Vein: No evidence of thrombus. Normal compressibility, respiratory phasicity and response to augmentation. Saphenofemoral Junction: No evidence of thrombus. Normal compressibility and flow on color Doppler imaging. Profunda Femoral Vein: No evidence of thrombus. Normal compressibility and flow on color Doppler imaging. Femoral Vein: No evidence of thrombus. Normal compressibility, respiratory phasicity and response to augmentation. Popliteal Vein: No evidence of thrombus. Normal compressibility, respiratory phasicity and response to augmentation. Calf Veins: No evidence of thrombus. Normal compressibility and flow on color Doppler imaging. Superficial Great Saphenous Vein: No evidence of thrombus. Normal compressibility. Venous Reflux:  None. Other Findings:  None. IMPRESSION: No evidence of DVT within either lower extremity. Electronically Signed   By: Sandi Mariscal M.D.   On: 05/12/2020 17:38   DG Foot Complete Left  Result Date: 05/17/2020 CLINICAL DATA:  Initial evaluation for acute trauma, fall. EXAM: LEFT FOOT - COMPLETE 3+ VIEW COMPARISON:  None. FINDINGS: No acute fracture or dislocation. Joint spaces maintained. Mild soft tissue swelling about the midfoot and ankle. 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 IMPRESSION: 1. No acute fracture or dislocation. 2. Mild soft tissue swelling about the midfoot and ankle. Electronically Signed   By: Jeannine Boga M.D.   On: 05/17/2020 04:25   DG C-Arm 1-60 Min  Result Date: 05/18/2020 CLINICAL DATA:  Repair  of left proximal femur fracture EXAM: OPERATIVE LEFT HIP WITH PELVIS; DG C-ARM 1-60 MIN COMPARISON:  05/17/2020 left hip radiographs FLUOROSCOPY TIME:  Fluoroscopy Time:  0 minutes 11 seconds Number of Acquired Spot Images: 5 FINDINGS: Multiple nondiagnostic spot fluoroscopic intraoperative left hip radiographs demonstrate postsurgical changes from left total hip arthroplasty. IMPRESSION: Intraoperative fluoroscopic guidance for left total hip arthroplasty. Electronically Signed   By: Ilona Sorrel M.D.   On: 05/18/2020 15:32   DG HIP OPERATIVE UNILAT W OR W/O PELVIS LEFT  Result Date: 05/18/2020 CLINICAL DATA:  Repair of left proximal femur fracture EXAM: OPERATIVE LEFT HIP WITH PELVIS; DG C-ARM 1-60 MIN COMPARISON:  05/17/2020 left hip radiographs FLUOROSCOPY TIME:  Fluoroscopy Time:  0 minutes 11 seconds Number of Acquired Spot Images: 5 FINDINGS: Multiple nondiagnostic spot fluoroscopic intraoperative left hip radiographs demonstrate postsurgical changes from left total hip arthroplasty. IMPRESSION: Intraoperative fluoroscopic guidance for left total hip arthroplasty. Electronically Signed   By: Ilona Sorrel M.D.   On: 05/18/2020 15:32   DG Hip Unilat W or Wo Pelvis 2-3 Views Left  Result Date: 05/17/2020 CLINICAL DATA:  Initial evaluation for acute trauma, fall. EXAM: DG HIP (WITH OR WITHOUT PELVIS) 2-3V LEFT COMPARISON:  None. FINDINGS: Acute fracture extends through the left femoral neck with slight superior subluxation. Femoral head remains in normal alignment with the acetabulum. Acetabulum itself appears intact. Bony pelvis intact. Limited views of the right hip demonstrate no acute finding. Underlying osteopenia. Scattered vascular calcifications noted about the inguinal regions bilaterally. IMPRESSION: Acute mildly displaced fracture of the left femoral neck. Electronically Signed   By: Jeannine Boga M.D.   On: 05/17/2020 04:22    Labs:  CBC: Recent Labs    05/16/20 0647  05/17/20 0446 05/18/20 0221 05/19/20 0301  WBC 10.6* 16.8* 11.8* 16.3*  HGB 11.7* 12.0* 11.8* 8.3*  HCT 36.5* 37.9* 35.8* 26.1*  PLT 365 331 311 281    COAGS: Recent Labs    05/15/20 0449 05/17/20 0446  INR 1.1 1.1  APTT  --  27    BMP: Recent Labs    05/16/20 0647 05/17/20 0446 05/18/20 0221 05/19/20 0301  NA 137 134* 139 137  K 3.9 3.8 3.8 4.4  CL 99 98 102 103  CO2 28 25 27 25   GLUCOSE 112* 142* 120* 119*  BUN 17 14 14 23   CALCIUM 9.3 9.5 9.5 8.9  CREATININE 0.53* 0.54* 0.59* 0.76  GFRNONAA >60 >60 >60 >60    LIVER FUNCTION TESTS: Recent Labs    05/15/20 0449 05/16/20 0647 05/17/20 0446 05/19/20 0301  BILITOT 0.6 0.5 0.5 0.5  AST 26 35 43* 31  ALT 34 40 64* 39  ALKPHOS 115 119 120 103  PROT 6.3* 6.4* 6.6 5.5*  ALBUMIN 3.0* 3.0* 3.2* 2.6*    TUMOR MARKERS: No results for input(s): AFPTM, CEA, CA199, CHROMGRNA in the last 8760 hours.  Assessment and Plan: Stage IV squamous cell carcinoma of the lung Patient known to IR from recent liver lesion biopsy confirming metastatic disease.  He is also blind with MS.  Readmitted 11/6 after fall at home after recent admission at [facility] 10/31-11/5.   IR consulted for Port-A-Cath placement for initiation of chemotherapy.  Met with  patient at bedside who understands goals of Port and is agreeable to placement.  Also spoke with brother, [name], who patient reports is his HCPOA.  All agree to move forward with placement tomorrow as schedule allows.  NPO p MN.   Risks and benefits of image guided port-a-catheter placement was discussed with the patient including, but not limited to bleeding, infection, pneumothorax, or fibrin sheath development and need for additional procedures.  All of the patient's questions were answered, patient is agreeable to proceed. Consent signed and in chart.   Thank you for this interesting consult.  I greatly enjoyed meeting VIRGIE CHERY and look forward to participating in their  care.  A copy of this report was sent to the requesting provider on this date.  Electronically Signed: Docia Barrier, PA 05/19/2020, 4:29 PM   I spent a total of 40 Minutes    in face to face in clinical consultation, greater than 50% of which was counseling/coordinating care for lung cancer.

## 2020-05-19 NOTE — Progress Notes (Signed)
Order placed for [facility]

## 2020-05-19 NOTE — Plan of Care (Signed)

## 2020-05-19 NOTE — Evaluation (Signed)
Occupational Therapy Evaluation Patient Details Name: Brandon Castillo MRN: [id number] DOB: [birth year] Today's Date: 05/19/2020    History of Present Illness 69 year old white male Known multiple sclerosis with blindness, Admitted to [facility] 10/31 through 05/16/2020 with new diagnosis of bilateral renal masses liver nodules bilateral adrenal nodules-during that hospital stay underwent liver biopsy showing an [place] lung primary needs Port-A-Cath placement;  Also had a scrotal sebaceous cyst expectant management. Was discharged on 05/16/2020 but readmitted 11/6 after having an accidental fall with left displaced femoral fracture in addition to comminuted left wrist fracture of distal radius. Pt is now s/p L THA.   Clinical Impression   Prior to initial hospitalization, Pt was mod  independent in ADL and mobility (SPC and Rollator PRN) - brother or sister-in-law coming over for IADL (cooking/cleaning). Pt has cane for the blind in his room, and also he likes to hold onto his brothers L arm above the elbow for community mobility. Today Pt is Mod A for bed mobility, min A +2 safety for transfers. MOd A overall for ADL as his dominant arm in a hard splint up to the elbow. ROM WFL at digits, elbow and shoulder but BUE tasks are affected. Pt will require skilled OT in the acute setting as well as afterwards at the [facility] level. Next session to take special care to assess access to LB for ADL. Pt will have 24 hour supervision from brother and sister-in-law. Pt very motivated. VSS throughout session.   Of Note: Per patient, plan is conservative for L wrist fracture - assumed NWB.     Follow Up Recommendations  Home health OT;Supervision/Assistance - 24 hour    Equipment Recommendations  3 in 1 bedside commode    Recommendations for Other Services Other (comment) (continued Palliative support)     Precautions / Restrictions Precautions Precautions: Fall;Other (comment) (Pt is blind) Required  Braces or Orthoses: Other Brace Other Brace: L forearm tong splint from OR Restrictions Weight Bearing Restrictions: Yes LUE Weight Bearing: Non weight bearing (assumed, not in order set - message sent to clarify) LLE Weight Bearing: Weight bearing as tolerated      Mobility Bed Mobility Overal bed mobility: Needs Assistance Bed Mobility: Supine to Sit     Supine to sit: Mod assist     General bed mobility comments: Pt able to assist with LLE coming to EOB, mod A for trunk elevation and use of pad to bring hips EOB    Transfers Overall transfer level: Needs assistance Equipment used: 2 person hand held assist Transfers: Sit to/from Stand Sit to Stand: Min assist;+2 safety/equipment         General transfer comment: R hand supported through arm rest of chair, good power up through BLE, min A for balance    Balance Overall balance assessment: Needs assistance Sitting-balance support: Feet supported;No upper extremity supported Sitting balance-Leahy Scale: Good Sitting balance - Comments: EOB without back support - unchallenged   Standing balance support: Single extremity supported Standing balance-Leahy Scale: Poor Standing balance comment: dependent on SUE for balance in standing using R arm                           ADL either performed or assessed with clinical judgement   ADL Overall ADL's : Needs assistance/impaired Eating/Feeding: Moderate assistance Eating/Feeding Details (indicate cue type and reason): due to vision - typically eats more hand held foods at baseline Grooming: Sitting;Wash/dry face;Moderate assistance;Set up Grooming  Details (indicate cue type and reason): in recliner, needs assist setting up due to LUE immobilized, and Pt is L hand dominant Upper Body Bathing: Moderate assistance   Lower Body Bathing: Moderate assistance   Upper Body Dressing : Maximal assistance   Lower Body Dressing: Moderate assistance Lower Body Dressing  Details (indicate cue type and reason): needs further assessment on ability to reach BLE for donning clothing, was independent prior Toilet Transfer: Minimal assistance;+2 for safety/equipment;Ambulation Toilet Transfer Details (indicate cue type and reason): Pt likes to hold onto L arm above the elbow - thats how he walks with his brother Toileting- Water quality scientist and Hygiene: Maximal assistance;Sit to/from stand Toileting - Clothing Manipulation Details (indicate cue type and reason): able to stand and hold onto therapist for balance - Other therapist placed/held urinal     Functional mobility during ADLs: Minimal assistance;+2 for safety/equipment General ADL Comments: deficits in balance, stamina, access to LB and BUE tasks due to new fx/THA     Vision Baseline Vision/History: Legally blind Additional Comments: wears sunglasses all the time     Perception     Praxis      Pertinent Vitals/Pain Pain Assessment: Faces Faces Pain Scale: Hurts a little bit Pain Location: L hip and abdomen (cancer makes his core tender) Pain Descriptors / Indicators: Tender;Discomfort;Sore Pain Intervention(s): Monitored during session;Repositioned;Premedicated before session;Ice applied     Hand Dominance Left   Extremity/Trunk Assessment Upper Extremity Assessment Upper Extremity Assessment: LUE deficits/detail;Generalized weakness LUE Deficits / Details: immobilized from elbow down, shoulder ROM, elbow, and fingers WFL  LUE: Unable to fully assess due to immobilization LUE Sensation: WNL   Lower Extremity Assessment Lower Extremity Assessment: Defer to PT evaluation   Cervical / Trunk Assessment Cervical / Trunk Assessment: Normal;Other exceptions Cervical / Trunk Exceptions: Pt reports that his abomen is tender from where the cancer is - be mindful with gait belt   Communication Communication Communication: No difficulties   Cognition Arousal/Alertness: Awake/alert Behavior  During Therapy: WFL for tasks assessed/performed Overall Cognitive Status: Within Functional Limits for tasks assessed                                     General Comments  Pt very very pleasant and loves to talk. VSS throughout session, HR up to 120 with ambulation in hallway this session    Exercises     Shoulder Instructions      Home Living Family/patient expects to be discharged to:: Private residence Living Arrangements: Other relatives (brother and sister in law) Available Help at Discharge: Family;Available 24 hours/day Type of Home: House Home Access: Stairs to enter [employer] of Steps: 2   Home Layout: Multi-level;Able to live on main level with bedroom/bathroom Alternate Level Stairs-Number of Steps: flight   Bathroom Shower/Tub: Teacher, early years/pre: Standard     Home Equipment: Environmental consultant - 4 wheels;Cane - single point (blind cane)          Prior Functioning/Environment Level of Independence: Needs assistance  Gait / Transfers Assistance Needed: SPC and Rollator PRN otherwise cane for the blind ADL's / Homemaking Assistance Needed: own bathing/dressing but assist for IADL   Comments: Pt was living independently but plans on permenantly moving in with brother at discharge        OT Problem List: Decreased range of motion;Decreased activity tolerance;Impaired balance (sitting and/or standing);Decreased knowledge of use of DME or AE;Impaired  UE functional use;Pain      OT Treatment/Interventions:      OT Goals(Current goals can be found in the care plan section) Acute Rehab OT Goals Patient Stated Goal: to get to brothers house and enjoy the outdoors OT Goal Formulation: With patient Time For Goal Achievement: 06/02/20 Potential to Achieve Goals: Good ADL Goals Pt Will Perform Grooming: with set-up;sitting Pt Will Perform Upper Body Dressing: with min guard assist;with caregiver independent in assisting;sitting Pt  Will Perform Lower Body Dressing: with min assist;with caregiver independent in assisting;sit to/from stand Pt Will Transfer to Toilet: with min guard assist;ambulating Pt Will Perform Toileting - Clothing Manipulation and hygiene: with supervision;sitting/lateral leans  OT Frequency: Min 2X/week   Barriers to D/C:            Co-evaluation PT/OT/SLP Co-Evaluation/Treatment: Yes Reason for Co-Treatment: Complexity of the patient's impairments (multi-system involvement);For patient/therapist safety;To address functional/ADL transfers PT goals addressed during session: Mobility/safety with mobility;Balance OT goals addressed during session: ADL's and self-care      AM-PAC OT "6 Clicks" Daily Activity     Outcome Measure Help from another person eating meals?: A Lot Help from another person taking care of personal grooming?: A Lot Help from another person toileting, which includes using toliet, bedpan, or urinal?: A Lot Help from another person bathing (including washing, rinsing, drying)?: A Lot Help from another person to put on and taking off regular upper body clothing?: A Lot Help from another person to put on and taking off regular lower body clothing?: A Lot 6 Click Score: 12   End of Session Equipment Utilized During Treatment: Gait belt Nurse Communication: Mobility status;Precautions  Activity Tolerance: Patient tolerated treatment well Patient left: in chair;with call bell/phone within reach;with chair alarm set  OT Visit Diagnosis: Unsteadiness on feet (R26.81);History of falling (Z91.81);Other abnormalities of gait and mobility (R26.89);Pain Pain - Right/Left: Left Pain - part of body: Arm;Hip                Time: 7564-3329 OT Time Calculation (min): 47 min Charges:  OT General Charges $OT Visit: 1 Visit OT Evaluation $OT Eval Moderate Complexity: [place] OTR/L Acute Rehabilitation Services Pager: [phone] Office: [place] 05/19/2020, 10:18 AM

## 2020-05-19 NOTE — Evaluation (Signed)
Physical Therapy Evaluation Patient Details Name: Brandon Castillo MRN: [id number] DOB: 24-May-1951 Today's Date: 05/19/2020   History of Present Illness  69 year old white male Known multiple sclerosis with blindness, Admitted to [facility] 10/31 through 05/16/2020 with new diagnosis of bilateral renal masses liver nodules bilateral adrenal nodules-during that hospital stay underwent liver biopsy showing an [place] lung primary needs Port-A-Cath placement;  Also had a scrotal sebaceous cyst expectant management. Was discharged on 05/16/2020 but readmitted 11/6 after having an accidental fall with left displaced femoral fracture in addition to comminuted left wrist fracture of distal radius. Pt is now s/p L THA.  Clinical Impression  Pt very pleasant and appreciative of all services. Pt tolerated OOB mobility very well with minimal pain. 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 Pt tolerated L LE WBing very well. Pt reports his brother and sister law are more than capable of caring for him and have set up a room for him downstairs so he doesn't have to do stairs and will provided 24/7 assist. Pt given exercises to due in bed. Acute PT to cont to follow.    Follow Up Recommendations Home health PT;Supervision/Assistance - 24 hour    Equipment Recommendations  None recommended by PT    Recommendations for Other Services       Precautions / Restrictions Precautions Precautions: Fall;Other (comment) (Pt is blind) Required Braces or Orthoses: Other Brace Other Brace: L forearm tong splint from OR Restrictions Weight Bearing Restrictions: Yes LUE Weight Bearing: Non weight bearing (assumed, not in order set - message sent to clarify) LLE Weight Bearing: Weight bearing as tolerated      Mobility  Bed Mobility Overal bed mobility: Needs Assistance Bed Mobility: Supine to Sit     Supine to sit: Mod assist     General bed mobility  comments: Pt able to assist with LLE coming to EOB, mod A for trunk elevation and use of pad to bring hips EOB    Transfers Overall transfer level: Needs assistance Equipment used: 2 person hand held assist Transfers: Sit to/from Stand Sit to Stand: Min assist;+2 safety/equipment         General transfer comment: R hand supported through arm rest of chair, good power up through BLE, min A for balance  Ambulation/Gait Ambulation/Gait assistance: Min assist;+2 physical assistance Gait Distance (Feet): 70 Feet Assistive device: 1 person hand held assist Gait Pattern/deviations: Step-through pattern;Decreased stride length;Wide base of support Gait velocity: slow Gait velocity interpretation: <1.31 ft/sec, indicative of household ambulator General Gait Details: due to impaired vision pt very guarded and cautions, pt unable to use mobility cane due to being left handed and unable to use cane in L hand due to L UE NWB. Pt held onto OT's back of arm at elbow with R UE, PT to provide stability via gait belt, pt with onset of SOB, pt with mask on requiring to stop and sit  Stairs            Wheelchair Mobility    Modified Rankin (Stroke Patients Only)       Balance Overall balance assessment: Needs assistance Sitting-balance support: Feet supported;No upper extremity supported Sitting balance-Leahy Scale: Good Sitting balance - Comments: EOB without back support - unchallenged   Standing balance support: Single extremity supported Standing balance-Leahy Scale: Poor Standing balance comment: dependent on SUE for balance in standing using R arm  Pertinent Vitals/Pain Pain Assessment: Faces Faces Pain Scale: Hurts a little bit Pain Location: L hip and abdomen (cancer makes his core tender) Pain Descriptors / Indicators: Tender;Discomfort;Sore Pain Intervention(s): Monitored during session    Home Living Family/patient expects to be  discharged to:: Private residence Living Arrangements: Other relatives (brother and sister in law) Available Help at Discharge: Family;Available 24 hours/day Type of Home: House Home Access: Stairs to enter   [employer] of Steps: 2 Home Layout: Multi-level;Able to live on main level with bedroom/bathroom Home Equipment: Walker - 4 wheels;Cane - single point (blind cane)      Prior Function Level of Independence: Needs assistance   Gait / Transfers Assistance Needed: SPC and Rollator PRN otherwise cane for the blind  ADL's / Homemaking Assistance Needed: own bathing/dressing but assist for IADL  Comments: Pt was living independently but plans on permenantly moving in with brother at discharge     Hand Dominance   Dominant Hand: Left    Extremity/Trunk Assessment   Upper Extremity Assessment Upper Extremity Assessment: Defer to OT evaluation LUE Deficits / Details: immobilized from elbow down, shoulder ROM, elbow, and fingers WFL  LUE: Unable to fully assess due to immobilization LUE Sensation: WNL    Lower Extremity Assessment Lower Extremity Assessment: LLE deficits/detail LLE Deficits / Details: pt actively moving at hip, knee and ankle, weakness as anticipated from surgery LLE Sensation: WNL LLE Coordination: decreased gross motor    Cervical / Trunk Assessment Cervical / Trunk Assessment: Normal;Other exceptions Cervical / Trunk Exceptions: Pt reports that his abomen is tender from where the cancer is - be mindful with gait belt  Communication   Communication: No difficulties  Cognition Arousal/Alertness: Awake/alert Behavior During Therapy: WFL for tasks assessed/performed Overall Cognitive Status: Within Functional Limits for tasks assessed                                 General Comments: pt with tangetial speech, states "I talk alot"      General Comments General comments (skin integrity, edema, etc.): VSS    Exercises General  Exercises - Lower Extremity Quad Sets: AROM;Both;10 reps;Supine Long Arc Quad: AROM;Both;10 reps;Seated   Assessment/Plan    PT Assessment Patient needs continued PT services  PT Problem List Decreased strength;Decreased range of motion;Decreased activity tolerance;Decreased balance;Decreased mobility;Decreased coordination;Decreased knowledge of use of DME       PT Treatment Interventions DME instruction;Gait training;Stair training;Functional mobility training;Therapeutic activities;Therapeutic exercise;Balance training;Neuromuscular re-education    PT Goals (Current goals can be found in the Care Plan section)  Acute Rehab PT Goals Patient Stated Goal: to get to brothers house and enjoy the outdoors PT Goal Formulation: With patient Time For Goal Achievement: 06/02/20 Potential to Achieve Goals: Good    Frequency 7X/week   Barriers to discharge        Co-evaluation PT/OT/SLP Co-Evaluation/Treatment: Yes Reason for Co-Treatment: Complexity of the patient's impairments (multi-system involvement) PT goals addressed during session: Mobility/safety with mobility OT goals addressed during session: ADL's and self-care       AM-PAC PT "6 Clicks" Mobility  Outcome Measure Help needed turning from your back to your side while in a flat bed without using bedrails?: A Lot Help needed moving from lying on your back to sitting on the side of a flat bed without using bedrails?: A Lot Help needed moving to and from a bed to a chair (including a wheelchair)?: A Little Help  needed standing up from a chair using your arms (e.g., wheelchair or bedside chair)?: A Little Help needed to walk in hospital room?: A Little Help needed climbing 3-5 steps with a railing? : A Lot 6 Click Score: 15    End of Session Equipment Utilized During Treatment: Gait belt Activity Tolerance: Patient tolerated treatment well Patient left: with call bell/phone within reach;with chair alarm set Nurse  Communication: Mobility status PT Visit Diagnosis: Unsteadiness on feet (R26.81);Muscle weakness (generalized) (M62.81);Difficulty in walking, not elsewhere classified (R26.2)    Time: 0623-7628 PT Time Calculation (min) (ACUTE ONLY): 45 min   Charges:   PT Evaluation $PT Eval Moderate Complexity: 1 Mod PT Treatments $Gait Training: 8-22 mins        Kittie Plater, PT, DPT Acute Rehabilitation Services Pager #: [phone] Office #: [phone]   Brandon Castillo 05/19/2020, 11:46 AM

## 2020-05-19 NOTE — Progress Notes (Signed)
PROGRESS NOTE    Brandon Castillo  IOE:[id number] DOB: [birth year] DOA: 05/17/2020 PCP: Patient, No Pcp Per  Brief Narrative:  69 year old white male Known multiple sclerosis with blindness Former heavy smoker Admitted to [facility] 10/31 through 05/16/2020 after having been found with decreasing appetite generalized weakness difficulty urinating and abdominal pain and generalized frailty CT scan abdomen showed bilateral renal masses liver nodules bilateral adrenal nodules-during that hospital stay underwent liver biopsy showing an [place] lung primary needs Port-A-Cath placement--palliative care also saw the patient during the hospital stay Also had a scrotal sebaceous cyst expectant management  Was discharged on 05/16/2020 but readmitted after having an accidental fall with left displaced femoral fracture in addition to comminuted left wrist fracture of distal radius   Assessment & Plan:   Principal Problem:   Closed left femoral fracture (HCC) Active Problems:   Multiple sclerosis (HCC)   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 Left femoral fracture a. Status post repair 11/7 b. Will need Eliquis twice daily for anticoagulation, weightbearing as tolerated-first dose of Eliquis this evening c. Will need outpatient follow-up with orthopedic  2. Left hip wrist fracture a. Defer to orthopedics--not anticipate repair rather anticipate healing with casting and 6 weeks in the same 3. Blood loss anemia expected acute from surgery a. Monitor trends and transfuse if below threshold of 7 b. Recheck labs in a.m. 4. Lung cancer right upper lobe with metastases needing Port-A-Cath a. Scheduled for Port-A-Cath 11/8 b. Defer management of  cancer to the outpatient setting 5. COPD/emphysema a. Does not usually use oxygen he tells me b. Not on inhalers-monitor and add if needed 6. Possible UTI a. Multiple colony-forming units on urine therefore discontinue antibiotics 7. Anxiety a. Continue trazodone 50 at bedtime, Ativan every 8 as needed anxiety 8. Multiple sclerosis with blindness a. Is dependent on family for care b. Probably will need skilled c. Outpatient follow-up 9. 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 Stable at this time  DVT prophylaxis: To start Eliquis a.m. Code Status: Presumed full Family Communication: None at the bedside currently Disposition:   Status is: Inpatient  Remains inpatient appropriate because:Hemodynamically unstable, Persistent severe electrolyte disturbances and Ongoing diagnostic testing needed not appropriate for outpatient work up   Dispo: The patient is from: Home              Anticipated d/c is to: SNF              Anticipated d/c date is: 2 days              Patient currently is not medically stable to d/c.       Consultants:   Orthopedics hip repair  Procedures: 11/7 hip repair  Antimicrobials: Stopped awake alert coherent conversant tells me finished all the dyspnea no chest pain no fever   Subjective: Awake coherent pleasant somewhat hungry has not eaten but is ready to go and get the port placed No chest pain no fever no chills no nausea no vomiting    Objective: Vitals:   05/18/20 1946 05/19/20 0033 05/19/20 0621 05/19/20 0900  BP: 121/67 118/67 111/71 117/75  Pulse: 89 93 87 85  Resp: 18 16 16 17   Temp: (!) 97.5 F (36.4 C)  97.7 F (36.5 C) 97.7 F (36.5 C) 98.5 F (36.9 C)  TempSrc: Oral Oral Oral Oral  SpO2: 96% 96% 96% 100%  Weight:      Height:        Intake/Output Summary (Last 24 hours) at 05/19/2020 1050 Last data filed at 05/19/2020 6644 Gross per 24 hour  Intake 1277 ml  Output --  Net 1277 ml   Filed Weights   05/17/20 0041   Weight: 68 kg    Examination:   awake coherent pleasant no distress Chest clear no added sound no rales no rhonchi  Abdomen soft no rebound no guarding Power 5/5 Left upper extremity is bandaged wrapped I did not examine the wound and lower extremity S1-S2 no murmur no rub no gallop  Data Reviewed: I have personally reviewed following labs and imaging studies BUNs/creatinine 14/0.5-->23/0.7 Potassium 3.8-->4.4 Hemoglobin 11.8-->8.3 WBC down from 16.8-11.8-->16.3 Platelet 311-->281    Radiology Studies: Pelvis Portable  Result Date: 05/18/2020 CLINICAL DATA:  Left total hip arthroplasty EXAM: PORTABLE PELVIS 1-2 VIEWS COMPARISON:  05/17/2020 left hip radiographs FINDINGS: Interval left total hip arthroplasty with no evidence of hardware fracture or loosening. No evidence of hip dislocation on these frontal views. No osseous fracture. No pelvic diastasis. No focal osseous lesions. Expected soft tissue gas surrounding the left hip. IMPRESSION: Satisfactory immediate postoperative frontal view appearance status post left total hip arthroplasty. Electronically Signed   By: Ilona Sorrel M.D.   On: 05/18/2020 15:44   CT FOOT LEFT WO CONTRAST  Result Date: 05/17/2020 CLINICAL DATA:  Ankle and foot injury question of fracture EXAM: CT OF THE LEFT FOOT WITHOUT CONTRAST TECHNIQUE: Multidetector CT imaging of the left foot was performed according to the standard protocol. Multiplanar CT image reconstructions were also generated. COMPARISON:  None. FINDINGS: Bones/Joint/Cartilage No fracture or dislocation. No widening of the Lisfranc joint is seen. There is diffuse osteopenia. Small well corticated ossicle seen at the adjacent to the medial malleolus. Mild tibiotalar joint osteoarthritis seen with joint space loss and subchondral sclerosis. First MTP joint osteoarthritis is with joint space. Ligaments Suboptimally assessed by CT. Muscles and Tendons The muscles surrounding the foot are intact  without focal atrophy or tear. The flexor and extensor tendons intact. The plantar fascia and Achilles tendon. Soft tissues Soft tissue swelling.  No ankle joint effusion is noted. IMPRESSION: No acute fracture or dislocation the ankle or foot. Electronically Signed   By: Prudencio Pair M.D.   On: 05/17/2020 21:24   CT ANKLE LEFT WO CONTRAST  Result Date: 05/17/2020 CLINICAL DATA:  Ankle and foot injury question of fracture EXAM: CT OF THE LEFT FOOT WITHOUT CONTRAST TECHNIQUE: Multidetector CT imaging of the left foot was performed according to the standard protocol. Multiplanar CT image reconstructions were also generated. COMPARISON:  None. FINDINGS: Bones/Joint/Cartilage No fracture or dislocation. No widening of the Lisfranc joint is seen. There is diffuse osteopenia. Small well corticated ossicle seen at the adjacent to the medial malleolus. Mild tibiotalar joint osteoarthritis seen with joint space loss and subchondral sclerosis. First MTP joint osteoarthritis is with joint space. Ligaments Suboptimally assessed by CT. Muscles and Tendons The muscles surrounding the foot are intact without focal atrophy or tear. The flexor and extensor tendons intact. The plantar fascia and Achilles tendon. Soft tissues Soft tissue swelling.  No ankle joint effusion is noted. IMPRESSION: No acute fracture or dislocation the ankle or foot. Electronically Signed   By: Prudencio Pair M.D.   On: 05/17/2020 21:24  MR FEMUR LEFT WO CONTRAST  Result Date: 05/17/2020 CLINICAL DATA:  Left femoral neck fracture after fall. History of metastatic lung cancer. EXAM: MR OF THE LEFT FEMUR WITHOUT CONTRAST TECHNIQUE: Multiplanar, multisequence MR imaging of the left femur was performed. No intravenous contrast was administered. COMPARISON:  Left hip x-rays from same day. CT abdomen pelvis dated May 12, 2011. FINDINGS: Bones/Joint/Cartilage Acute mildly displaced left femoral neck fracture with apex anterior angulation again noted. No  focal bone lesion. No dislocation. No joint effusion. Muscles and Tendons Mild edema in the bilateral adductor muscles.  No muscle atrophy. Soft tissue No fluid collection or hematoma.  No soft tissue mass. IMPRESSION: 1. Acute mildly displaced left femoral neck fracture again noted. No underlying bone lesion. Electronically Signed   By: Titus Dubin M.D.   On: 05/17/2020 16:12   DG C-Arm 1-60 Min  Result Date: 05/18/2020 CLINICAL DATA:  Repair of left proximal femur fracture EXAM: OPERATIVE LEFT HIP WITH PELVIS; DG C-ARM 1-60 MIN COMPARISON:  05/17/2020 left hip radiographs FLUOROSCOPY TIME:  Fluoroscopy Time:  0 minutes 11 seconds Number of Acquired Spot Images: 5 FINDINGS: Multiple nondiagnostic spot fluoroscopic intraoperative left hip radiographs demonstrate postsurgical changes from left total hip arthroplasty. IMPRESSION: Intraoperative fluoroscopic guidance for left total hip arthroplasty. Electronically Signed   By: Ilona Sorrel M.D.   On: 05/18/2020 15:32   DG HIP OPERATIVE UNILAT W OR W/O PELVIS LEFT  Result Date: 05/18/2020 CLINICAL DATA:  Repair of left proximal femur fracture EXAM: OPERATIVE LEFT HIP WITH PELVIS; DG C-ARM 1-60 MIN COMPARISON:  05/17/2020 left hip radiographs FLUOROSCOPY TIME:  Fluoroscopy Time:  0 minutes 11 seconds Number of Acquired Spot Images: 5 FINDINGS: Multiple nondiagnostic spot fluoroscopic intraoperative left hip radiographs demonstrate postsurgical changes from left total hip arthroplasty. IMPRESSION: Intraoperative fluoroscopic guidance for left total hip arthroplasty. Electronically Signed   By: Ilona Sorrel M.D.   On: 05/18/2020 15:32     Scheduled Meds: . apixaban  2.5 mg Oral BID  . Chlorhexidine Gluconate Cloth  6 each Topical Daily  . docusate sodium  100 mg Oral BID  . feeding supplement  237 mL Oral TID  . methocarbamol  500 mg Oral QID  . multivitamin with minerals  1 tablet Oral Daily  . pantoprazole  40 mg Oral Daily  . polyethylene glycol   17 g Oral Daily  . senna  1 tablet Oral BID  . sertraline  50 mg Oral Daily  . traZODone  50 mg Oral QHS   Continuous Infusions: . dextrose 5 % and 0.45% NaCl 40 mL/hr at 05/18/20 1131     LOS: 2 days    Time spent: [place], MD [facility] To contact the attending provider between 7A-7P or the covering provider during after hours 7P-7A, please log into the web site [email or web address] and access using universal [place] password for that web site. If you do not have the password, please call the hospital operator.  05/19/2020, 10:50 AM

## 2020-05-19 NOTE — Progress Notes (Signed)
This chaplain responded to the PMT consult for spiritual care and creating an Advance Directive. The Pt. is sitting up in the bedside recliner and celebrating the opportunity to walk 100' with the PT.  The Pt. is appreciative of the care he has received from the medical team.   The chaplain listened to the Pt. joyful spirit positively partner with his perception of quality of life.  The Pt. shared his plans to move in with his brother upon discharge. The Pt. confirmed his brother Aisea Bouldin is his surrogate Research scientist (physical sciences). The Pt. declined the opportunity for naming a HCPOA stating, he has no other family.  The chaplain is available for F/U spiritual care as needed.

## 2020-05-19 NOTE — Progress Notes (Signed)
Palliative Medicine RN Note: Discussed pt in team rounds. Note that he is due for Port-a-cath placement. If Mr Brandon Castillo discharges before our team can follow up with him, he would benefit from outpatient palliative care services to continue [place] conversations as his treatment progresses.  To set that up, please place a TOC order for OP Palliative Care. Please note, this service is NOT provided by our team, so the referral will need to be made through [facility].  Marjie Skiff Betsey Sossamon, RN, BSN, [facility] Palliative Medicine Team 05/19/2020 12:39 PM Office [phone]

## 2020-05-19 NOTE — Progress Notes (Signed)
Physical Therapy Treatment Patient Details Name: Brandon Castillo MRN: [id number] DOB: [birth year] Today's Date: 05/19/2020    History of Present Illness 69 year old white male Known multiple sclerosis with blindness, Admitted to [facility] 10/31 through 05/16/2020 with new diagnosis of bilateral renal masses liver nodules bilateral adrenal nodules-during that hospital stay underwent liver biopsy showing an [place] lung primary needs Port-A-Cath placement;  Also had a scrotal sebaceous cyst expectant management. Was discharged on 05/16/2020 but readmitted 11/6 after having an accidental fall with left displaced femoral fracture in addition to comminuted left wrist fracture of distal radius. Pt is now s/p L THA.    PT Comments    Pt seen for second treatment to progress ambulation for safe d/c home with brother. Pt very appreciative of services and eager to amb. Pt amb tolerance limited by SOB, pt states because of mask. Pt moving L UE well and is aware of L UE NWB and adhering to that precaution well. Acute PT to cont to follow.    Follow Up Recommendations  Home health PT;Supervision/Assistance - 24 hour     Equipment Recommendations  None recommended by PT    Recommendations for Other Services       Precautions / Restrictions Precautions Precautions: Fall;Other (comment) Precaution Comments: pt legally blind in both eyes Required Braces or Orthoses: Other Brace Other Brace: L forearm tong splint from OR Restrictions Weight Bearing Restrictions: Yes LUE Weight Bearing: Non weight bearing LLE Weight Bearing: Weight bearing as tolerated    Mobility  Bed Mobility Overal bed mobility: Needs Assistance Bed Mobility: Sit to Supine     Supine to sit: Mod assist Sit to supine: Mod assist   General bed mobility comments: modA for L LE, max directional verbal cues, pt was able to bridge hips with bilat feet on bed in hooklying position and was also able to scoot self  up  Transfers Overall transfer level: Needs assistance Equipment used: 1 person hand held assist Transfers: Sit to/from Stand Sit to Stand: Min assist         General transfer comment: R HHA due to impaired vision  Ambulation/Gait Ambulation/Gait assistance: Min assist Gait Distance (Feet): 50 Feet Assistive device: 1 person hand held assist Gait Pattern/deviations: Step-through pattern;Decreased stride length;Wide base of support Gait velocity: slow Gait velocity interpretation: <1.31 ft/sec, indicative of household ambulator General Gait Details: pt with slow guarded amb pattern due to being legally blind and SOB with wearing a mask   Stairs             Wheelchair Mobility    Modified Rankin (Stroke Patients Only)       Balance Overall balance assessment: Needs assistance Sitting-balance support: Feet supported;No upper extremity supported Sitting balance-Leahy Scale: Good Sitting balance - Comments: EOB without back support - unchallenged   Standing balance support: Single extremity supported Standing balance-Leahy Scale: Poor Standing balance comment: dependent on SUE for balance in standing using R arm                            Cognition Arousal/Alertness: Awake/alert Behavior During Therapy: WFL for tasks assessed/performed Overall Cognitive Status: Within Functional Limits for tasks assessed                                 General Comments: pt with tangetial speech, states "I talk alot"  Exercises General Exercises - Lower Extremity Quad Sets: AROM;Both;10 reps;Supine Gluteal Sets: AROM;Both;10 reps Long Arc Quad: AROM;Both;10 reps;Seated    General Comments General comments (skin integrity, edema, etc.): VSS      Pertinent Vitals/Pain Pain Assessment: Faces Faces Pain Scale: Hurts a little bit Pain Location: L hip and abdomen (cancer makes his core tender) Pain Descriptors / Indicators:  Tender;Discomfort;Sore Pain Intervention(s): Monitored during session    Home Living Family/patient expects to be discharged to:: Private residence Living Arrangements: Other relatives (brother and sister in law) Available Help at Discharge: Family;Available 24 hours/day Type of Home: House Home Access: Stairs to enter   Home Layout: Multi-level;Able to live on main level with bedroom/bathroom Home Equipment: Walker - 4 wheels;Cane - single point (blind cane)      Prior Function Level of Independence: Needs assistance  Gait / Transfers Assistance Needed: SPC and Rollator PRN otherwise cane for the blind ADL's / Homemaking Assistance Needed: own bathing/dressing but assist for IADL Comments: Pt was living independently but plans on permenantly moving in with brother at discharge   PT Goals (current goals can now be found in the care plan section) Acute Rehab PT Goals Patient Stated Goal: to get to brothers house and enjoy the outdoors PT Goal Formulation: With patient Time For Goal Achievement: 06/02/20 Potential to Achieve Goals: Good Progress towards PT goals: Progressing toward goals    Frequency    7X/week      PT Plan Current plan remains appropriate    Co-evaluation PT/OT/SLP Co-Evaluation/Treatment: Yes Reason for Co-Treatment: Complexity of the patient's impairments (multi-system involvement) PT goals addressed during session: Mobility/safety with mobility        AM-PAC PT "6 Clicks" Mobility   Outcome Measure  Help needed turning from your back to your side while in a flat bed without using bedrails?: A Little Help needed moving from lying on your back to sitting on the side of a flat bed without using bedrails?: A Little Help needed moving to and from a bed to a chair (including a wheelchair)?: A Little Help needed standing up from a chair using your arms (e.g., wheelchair or bedside chair)?: A Little Help needed to walk in hospital room?: A Little Help  needed climbing 3-5 steps with a railing? : A Lot 6 Click Score: 17    End of Session Equipment Utilized During Treatment: Gait belt Activity Tolerance: Patient tolerated treatment well Patient left: with call bell/phone within reach;with chair alarm set Nurse Communication: Mobility status PT Visit Diagnosis: Unsteadiness on feet (R26.81);Muscle weakness (generalized) (M62.81);Difficulty in walking, not elsewhere classified (R26.2)     Time: 6384-5364 PT Time Calculation (min) (ACUTE ONLY): 15 min  Charges:  $Gait Training: 8-22 mins                     Kittie Plater, PT, DPT Acute Rehabilitation Services Pager #: [phone] Office #: [phone]    Berline Lopes 05/19/2020, 2:26 PM

## 2020-05-19 NOTE — Discharge Instructions (Signed)
Dr. Rod Can Joint Replacement Specialist [facility] [address] [place], [place] [address] [phone]   TOTAL HIP REPLACEMENT POSTOPERATIVE DIRECTIONS    Hip Rehabilitation, Guidelines Following Surgery   WEIGHT BEARING Weight bearing as tolerated with assist device (walker, cane, etc) as directed, use it as long as suggested by your surgeon or therapist, typically at least 4-6 weeks.  The results of a hip operation are greatly improved after range of motion and muscle strengthening exercises. Follow all safety measures which are given to protect your hip. If any of these exercises cause increased pain or swelling in your joint, decrease the amount until you are comfortable again. Then slowly increase the exercises. Call your caregiver if you have problems or questions.   HOME CARE INSTRUCTIONS  Most of the following instructions are designed to prevent the dislocation of your new hip.  Remove items at home which could result in a fall. This includes throw rugs or furniture in walking pathways.  Continue medications as instructed at time of discharge.  You may have some home medications which will be placed on hold until you complete the course of blood thinner medication.  You may start showering once you are discharged home. Do not remove your dressing. Do not put on socks or shoes without following the instructions of your caregivers.   Sit on chairs with arms. Use the chair arms to help push yourself up when arising.  Arrange for the use of a toilet seat elevator so you are not sitting low.   Walk with walker as instructed.  You may resume a sexual relationship in one month or when given the OK by your caregiver.  Use walker as long as suggested by your caregivers.  You may put full weight on your legs and walk as much as is comfortable. Avoid periods of inactivity such as sitting longer than an hour when not asleep. This helps prevent  blood clots.  You may return to work once you are cleared by Engineer, production.  Do not drive a car for 6 weeks or until released by your surgeon.  Do not drive while taking narcotics.  Wear elastic stockings for two weeks following surgery during the day but you may remove then at night.  Make sure you keep all of your appointments after your operation with all of your doctors and caregivers. You should call the office at the above phone number and make an appointment for approximately two weeks after the date of your surgery. Please pick up a stool softener and laxative for home use as long as you are requiring pain medications.  ICE to the affected hip every three hours for 30 minutes at a time and then as needed for pain and swelling. Continue to use ice on the hip for pain and swelling from surgery. You may notice swelling that will progress down to the foot and ankle.  This is normal after surgery.  Elevate the leg when you are not up walking on it.   It is important for you to complete the blood thinner medication as prescribed by your doctor.  Continue to use the breathing machine which will help keep your temperature down.  It is common for your temperature to cycle up and down following surgery, especially at night when you are not up moving around and exerting yourself.  The breathing machine keeps your lungs expanded and your temperature down.  RANGE OF MOTION AND STRENGTHENING EXERCISES  These exercises  are designed to help you keep full movement of your hip joint. Follow your caregiver's or physical therapist's instructions. Perform all exercises about fifteen times, three times per day or as directed. Exercise both hips, even if you have had only one joint replacement. These exercises can be done on a training (exercise) mat, on the floor, on a table or on a bed. Use whatever works the best and is most comfortable for you. Use music or television while you are exercising so that the exercises  are a pleasant break in your day. This will make your life better with the exercises acting as a break in routine you can look forward to.  Lying on your back, slowly slide your foot toward your buttocks, raising your knee up off the floor. Then slowly slide your foot back down until your leg is straight again.  Lying on your back spread your legs as far apart as you can without causing discomfort.  Lying on your side, raise your upper leg and foot straight up from the floor as far as is comfortable. Slowly lower the leg and repeat.  Lying on your back, tighten up the muscle in the front of your thigh (quadriceps muscles). You can do this by keeping your leg straight and trying to raise your heel off the floor. This helps strengthen the largest muscle supporting your knee.  Lying on your back, tighten up the muscles of your buttocks both with the legs straight and with the knee bent at a comfortable angle while keeping your heel on the floor.   SKILLED REHAB INSTRUCTIONS: If the patient is transferred to a skilled rehab facility following release from the hospital, a list of the current medications will be sent to the facility for the patient to continue.  When discharged from the skilled rehab facility, please have the facility set up the patient's [place] prior to being released. Also, the skilled facility will be responsible for providing the patient with their medications at time of release from the facility to include their pain medication and their blood thinner medication. If the patient is still at the rehab facility at time of the two week follow up appointment, the skilled rehab facility will also need to assist the patient in arranging follow up appointment in our office and any transportation needs.  MAKE SURE YOU:  Understand these instructions.  Will watch your condition.  Will get help right away if you are not doing well or get worse.  Pick up stool softner and  laxative for home use following surgery while on pain medications. Do not remove your dressing. The dressing is waterproof--it is OK to take showers. Continue to use ice for pain and swelling after surgery. Do not use any lotions or creams on the incision until instructed by your surgeon. Total Hip Protocol.    Information on my medicine - ELIQUIS (apixaban)  This medication education was reviewed with me or my healthcare representative as part of my discharge preparation.    Why was Eliquis prescribed for you? Eliquis was prescribed for you to reduce the risk of blood clots forming after orthopedic surgery.    What do You need to know about Eliquis? Take your Eliquis TWICE DAILY - one tablet in the morning and one tablet in the evening with or without food.  It would be best to take the dose about the same time each day.  If you have difficulty swallowing the tablet whole please discuss with  your pharmacist how to take the medication safely.  Take Eliquis exactly as prescribed by your doctor and DO NOT stop taking Eliquis without talking to the doctor who prescribed the medication.  Stopping without other medication to take the place of Eliquis may increase your risk of developing a clot.  After discharge, you should have regular check-up appointments with your healthcare provider that is prescribing your Eliquis.  What do you do if you miss a dose? If a dose of ELIQUIS is not taken at the scheduled time, take it as soon as possible on the same day and twice-daily administration should be resumed.  The dose should not be doubled to make up for a missed dose.  Do not take more than one tablet of ELIQUIS at the same time.  Important Safety Information A possible side effect of Eliquis is bleeding. You should call your healthcare provider right away if you experience any of the following: ? Bleeding from an injury or your nose that does not stop. ? Unusual colored urine (red  or dark brown) or unusual colored stools (red or black). ? Unusual bruising for unknown reasons. ? A serious fall or if you hit your head (even if there is no bleeding).  Some medicines may interact with Eliquis and might increase your risk of bleeding or clotting while on Eliquis. To help avoid this, consult your healthcare provider or pharmacist prior to using any new prescription or non-prescription medications, including herbals, vitamins, non-steroidal anti-inflammatory drugs (NSAIDs) and supplements.  This website has more information on Eliquis (apixaban): http://www.eliquis.com/eliquis/home

## 2020-05-20 ENCOUNTER — Inpatient Hospital Stay (HOSPITAL_COMMUNITY): Payer: Medicare HMO

## 2020-05-20 HISTORY — PX: IR IMAGING GUIDED PORT INSERTION: IMG5740

## 2020-05-20 LAB — CBC
HCT: 25.5 % — ABNORMAL LOW (ref 39.0–52.0)
Hemoglobin: 8.1 g/dL — ABNORMAL LOW (ref 13.0–17.0)
MCH: 32.1 pg (ref 26.0–34.0)
MCHC: 31.8 g/dL (ref 30.0–36.0)
MCV: 101.2 fL — ABNORMAL HIGH (ref 80.0–100.0)
Platelets: 278 10*3/uL (ref 150–400)
RBC: 2.52 MIL/uL — ABNORMAL LOW (ref 4.22–5.81)
RDW: 12.7 % (ref 11.5–15.5)
WBC: 12.1 10*3/uL — ABNORMAL HIGH (ref 4.0–10.5)
nRBC: 0 % (ref 0.0–0.2)

## 2020-05-20 LAB — GLUCOSE, CAPILLARY
Glucose-Capillary: 104 mg/dL — ABNORMAL HIGH (ref 70–99)
Glucose-Capillary: 105 mg/dL — ABNORMAL HIGH (ref 70–99)
Glucose-Capillary: 110 mg/dL — ABNORMAL HIGH (ref 70–99)

## 2020-05-20 MED ORDER — MIDAZOLAM HCL 2 MG/2ML IJ SOLN
INTRAMUSCULAR | Status: AC | PRN
  Administered 2020-05-20: 1 mg via INTRAVENOUS

## 2020-05-20 MED ORDER — MIDAZOLAM HCL 2 MG/2ML IJ SOLN
INTRAMUSCULAR | Status: AC
  Filled 2020-05-20: qty 2

## 2020-05-20 MED ORDER — CEFAZOLIN SODIUM-DEXTROSE 2-4 GM/100ML-% IV SOLN
2.0000 g | INTRAVENOUS | Status: AC
  Administered 2020-05-20: 2 g via INTRAVENOUS

## 2020-05-20 MED ORDER — CEFAZOLIN SODIUM-DEXTROSE 2-4 GM/100ML-% IV SOLN
INTRAVENOUS | Status: AC
  Filled 2020-05-20: qty 100

## 2020-05-20 MED ORDER — FERROUS SULFATE 325 (65 FE) MG PO TABS: 325.0000 mg | ORAL_TABLET | Freq: Two times a day (BID) | ORAL | 11 refills | Status: DC

## 2020-05-20 MED ORDER — LIDOCAINE HCL (PF) 1 % IJ SOLN
INTRAMUSCULAR | Status: AC | PRN
  Administered 2020-05-20: 20 mL

## 2020-05-20 MED ORDER — FENTANYL CITRATE (PF) 100 MCG/2ML IJ SOLN
INTRAMUSCULAR | Status: AC
  Filled 2020-05-20: qty 2

## 2020-05-20 MED ORDER — METHOCARBAMOL 500 MG PO TABS
500.0000 mg | ORAL_TABLET | Freq: Four times a day (QID) | ORAL | 0 refills | Status: DC
Start: 2020-05-20 — End: 2020-06-11

## 2020-05-20 MED ORDER — OXYCODONE HCL 10 MG PO TABS: 10.0000 mg | ORAL_TABLET | ORAL | 0 refills | Status: DC | PRN

## 2020-05-20 MED ORDER — LIDOCAINE-EPINEPHRINE 1 %-1:100000 IJ SOLN
INTRAMUSCULAR | Status: AC
  Filled 2020-05-20: qty 1

## 2020-05-20 MED ORDER — HEPARIN SOD (PORK) LOCK FLUSH 100 UNIT/ML IV SOLN
INTRAVENOUS | Status: AC
  Filled 2020-05-20: qty 5

## 2020-05-20 MED ORDER — FENTANYL CITRATE (PF) 100 MCG/2ML IJ SOLN
INTRAMUSCULAR | Status: AC | PRN
Start: 2020-05-20 — End: 2020-05-20
  Administered 2020-05-20 (×2): 25 ug via INTRAVENOUS

## 2020-05-20 MED ORDER — APIXABAN 2.5 MG PO TABS
2.5000 mg | ORAL_TABLET | Freq: Two times a day (BID) | ORAL | 0 refills | Status: DC
Start: 2020-05-20 — End: 2020-06-20

## 2020-05-20 NOTE — Progress Notes (Signed)
Discharge instructions given. Patient verbalized understanding and all questions were answered.  ?

## 2020-05-20 NOTE — TOC Transition Note (Addendum)
Transition of Care [facility]) - CM/SW Discharge Note   Patient Details  Name: Brandon Castillo MRN: [id number] Date of Birth: [birth year]  Transition of Care [facility]) CM/SW Contact:  Sharin Mons, RN Phone Number: 05/20/2020, 10:09 AM   Clinical Narrative:    Patient will DC to: home Anticipated DC date: 05/20/2020 Family notified: [name] ( brother) Transport by: Musician  Admitted with Closed left femoral fracture/ L wrist fracture, hx of Multiple sclerosis,blind in both eyes from MS, Stage IV  Lung Cancer with Mets, Emphysema lung.         s/p  Left total hip arthroplasty,11/07;  Placement of RIJ power port 11/9.  Per MD patient ready for DC today.RN, patient, and patient's family notified of DC. Pt with resumption orders for [facility] services. Prior to admit pt active with [facility] and would like to continue with them. NCM made [facility] liaison aware of d/c plan. Pt without noted DME needs Rx meds, pt without concerns or affordability.   RNCM will sign off for now as intervention is no longer needed. Please consult [place] again if new needs arise.    Final next level of care: Home w Home Health Services Barriers to Discharge: No Barriers Identified   Patient Goals and CMS Choice Patient states their goals for this hospitalization and ongoing recovery are:: to go home   Choice offered to / list presented to : Patient [name] ( brother))  Discharge Placement                       Discharge Plan and Services                    Date DME Agency Contacted: 05/20/20 Time DME Agency Contacted: 63   HH Arranged: RN, PT, OT, Nurse's Aide, Social Work [employer] Agency: [place] ([place]) Date [place]: 05/20/20 Time [place]: 1005 Representative spoke with at [place]: [place] ([place]) Interventions     Readmission Risk Interventions No flowsheet data found.

## 2020-05-20 NOTE — Procedures (Signed)
Interventional Radiology Procedure Note  Procedure: Placement of a right IJ approach single lumen PowerPort.  Tip is positioned at the superior cavoatrial junction and catheter is ready for immediate use.  Complications: No immediate Recommendations:  - Ok to shower tomorrow - Do not submerge for 7 days - Routine line care   Signed,  Kyron Schlitt K. Rithika Seel, MD   

## 2020-05-20 NOTE — Progress Notes (Signed)
Physical Therapy Treatment Patient Details Name: Brandon Castillo MRN: [id number] DOB: [birth year] Today's Date: 05/20/2020    History of Present Illness Pt is a 69 y.o. male admitted 05/17/20 after fall sustaining L displaced femoral fx and comminuted L distal radius fx. S/p L THA 11/7. S/p RIJ port insertion 11/9. 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 PMH includes blindness, multiple sclerosis.   PT Comments    Pt progressing well with mobility; endorses increased soreness after yesterday's ambulation, as well as fatigue after procedure this AM, still motivated to participate despite this. Pt able to transfer and ambulate with HHA and intermittent minA for stability. Increased time discussing safety recommendations, assist needs and fall risk reduction for return home. Also educ re: precautions, positioning, activity recommendations, and importance of mobility. Pt motivated to d/c home today; will have necessary assist from family.    Follow Up Recommendations  Home health PT;Supervision/Assistance - 24 hour     Equipment Recommendations  None recommended by PT    Recommendations for Other Services       Precautions / Restrictions Precautions Precautions: Fall;Other (comment) Precaution Comments: Legally blind in both eyes Required Braces or Orthoses: Other Brace Other Brace: L forearm long splint, fingers exposed Restrictions Weight Bearing Restrictions: Yes LUE Weight Bearing: Non weight bearing LLE Weight Bearing: Weight bearing as tolerated    Mobility  Bed Mobility Overal bed mobility: Needs Assistance Bed Mobility: Supine to Sit     Supine to sit: Min assist     General bed mobility comments: Reliant on bed rail, increased time and effort to scoot hips to EOB; minA for assisting hips to EOB  Transfers Overall transfer level: Needs assistance Equipment used:  None Transfers: Sit to/from Stand Sit to Stand: Min guard         General transfer comment: Min guard for balance, no UE support to stand  Ambulation/Gait Ambulation/Gait assistance: Min Web designer (Feet): 24 Feet Assistive device: 1 person hand held assist Gait Pattern/deviations: Step-through pattern;Decreased stride length;Antalgic Gait velocity: Decreased   General Gait Details: Slow, antalgic gait with minA for HHA; 1x standing rest break due to SOB and fatigue, cues for pursed lip breathing; pt reports increased soreness, "I overdid it yesterday," and fatigue limiting further mobility   Stairs Stairs:  (Declined stair training secondary to fatigue; educ on safe technique ascending 2 steps into home with assist from brother)           Wheelchair Mobility    Modified Rankin (Stroke Patients Only)       Balance Overall balance assessment: Needs assistance Sitting-balance support: Feet supported;No upper extremity supported Sitting balance-Leahy Scale: Good       Standing balance-Leahy Scale: Fair Standing balance comment: Can static stand without UE support; reliant on UE support for dynamic stability                            Cognition Arousal/Alertness: Awake/alert Behavior During Therapy: WFL for tasks assessed/performed Overall Cognitive Status: Within Functional Limits for tasks assessed                                 General Comments: Pleasant and motivated. Attention limited at time as pt tangential with speech requiring redirection to conversation, easily redirected  Exercises General Exercises - Lower Extremity Ankle Circles/Pumps: AROM;Both;Seated Long Arc Quad: AROM;Left;Seated    General Comments General comments (skin integrity, edema, etc.): Reports d/c home in SUV that is level so he will not have to step up into it for return home      Pertinent Vitals/Pain Pain Assessment: Faces Faces Pain  Scale: Hurts a little bit Pain Location: L elbow > L hip Pain Descriptors / Indicators: Discomfort;Sore;Guarding Pain Intervention(s): Monitored during session    Home Living                      Prior Function            PT Goals (current goals can now be found in the care plan section) Progress towards PT goals: Progressing toward goals    Frequency    7X/week      PT Plan Current plan remains appropriate    Co-evaluation              AM-PAC PT "6 Clicks" Mobility   Outcome Measure  Help needed turning from your back to your side while in a flat bed without using bedrails?: A Little Help needed moving from lying on your back to sitting on the side of a flat bed without using bedrails?: A Little Help needed moving to and from a bed to a chair (including a wheelchair)?: A Little Help needed standing up from a chair using your arms (e.g., wheelchair or bedside chair)?: A Little Help needed to walk in hospital room?: A Little Help needed climbing 3-5 steps with a railing? : A Lot 6 Click Score: 17    End of Session Equipment Utilized During Treatment: Gait belt Activity Tolerance: Patient tolerated treatment well;Patient limited by fatigue Patient left: in chair;with call bell/phone within reach;with chair alarm set Nurse Communication: Mobility status PT Visit Diagnosis: Unsteadiness on feet (R26.81);Muscle weakness (generalized) (M62.81);Difficulty in walking, not elsewhere classified (R26.2)     Time: 0865-7846 PT Time Calculation (min) (ACUTE ONLY): 24 min  Charges:  $Gait Training: 8-22 mins $Self Care/Home Management: [place], PT, DPT Acute Rehabilitation Services  Pager [phone] Office [phone]  Derry Lory 05/20/2020, 12:34 PM

## 2020-05-20 NOTE — Progress Notes (Addendum)
    Subjective:  Patient reports pain as mild to moderate.  Denies N/V/CP/SOB. Endorses flatus.   Objective:   VITALS:   Vitals:   05/20/20 0935 05/20/20 0940 05/20/20 0945 05/20/20 0950  BP: 126/62 (!) 131/58 125/62 126/60  Pulse: 85 87 89 86  Resp: (!) 23 (!) 25 (!) 24 20  Temp:      TempSrc:      SpO2: 99% 99% 99% 100%  Weight:      Height:        NAD ABD soft Neurovascular intact Sensation intact distally Intact pulses distally Dorsiflexion/Plantar flexion intact Incision: dressing C/D/I   Lab Results  Component Value Date   WBC 12.1 (H) 05/20/2020   HGB 8.1 (L) 05/20/2020   HCT 25.5 (L) 05/20/2020   MCV 101.2 (H) 05/20/2020   PLT 278 05/20/2020   BMET    Component Value Date/Time   NA 137 05/19/2020 0301   K 4.4 05/19/2020 0301   CL 103 05/19/2020 0301   CO2 25 05/19/2020 0301   GLUCOSE 119 (H) 05/19/2020 0301   BUN 23 05/19/2020 0301   CREATININE 0.76 05/19/2020 0301   CALCIUM 8.9 05/19/2020 0301   GFRNONAA >60 05/19/2020 0301     Assessment/Plan: 2 Days Post-Op   Principal Problem:   Closed left femoral fracture (HCC) Active Problems:   Multiple sclerosis (HCC)   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 Jeannie Fend on Friday     Ellie Bryand B Rana Adorno 05/20/2020, 10:52 AM  [facility] is now [employer] [address], [place], [place] [address] Phone: [phone] [email or web address]  [employer]

## 2020-05-20 NOTE — Discharge Summary (Signed)
Physician Discharge Summary  DEMONTRAE GILBERT NLZ:[id number] DOB: 02-Nov-1950 DOA: 05/17/2020  PCP: Patient, No Pcp Per  Admit date: 05/17/2020 Discharge date: 05/20/2020  Time spent: 27 minutes  Recommendations for Outpatient Follow-up:  1. Needs Chem-7, CBC 1 week 2. Needs outpatient follow-up with [facility] oncology 3. Port being placed 05/20/2020 to assist with management 4. 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 (Pleural/Chest/Abdomen/Liver)/Non-Small Cell Carcinoma)    Wrist fracture, closed, left, initial encounter   Palliative care encounter   Discharge Condition: Improved  Diet recommendation: Heart healthy  Filed Weights   05/17/20 0041  Weight: 68 kg    History of present illness:  69 year old white male Known multiple sclerosis with blindness Former heavy smoker Admitted to [facility] 10/31 through 05/16/2020 after having been found with decreasing appetite generalized weakness difficulty urinating and abdominal pain and generalized frailty CT scan abdomen showed bilateral renal masses liver nodules bilateral adrenal nodules-during that hospital stay underwent liver biopsy showing an [place] lung primary needs Port-A-Cath placement--palliative care also saw the patient during the hospital stay Also had a scrotal sebaceous cyst expectant management  Was discharged on 05/16/2020 but readmitted after having an accidental fall with left displaced femoral fracture in addition to comminuted left wrist fracture of distal  radius  Hospital Course:  1. Left femoral fracture a. Status post repair 11/7 b. Will need Eliquis twice daily for anticoagulation, weightbearing as tolerated-first dose of Eliquis post procedure c. Will need outpatient follow-up with orthopedic  2. Left hip wrist fracture a. Defer to orthopedics--not anticipate repair rather anticipate healing with casting and 6 weeks in the same b. Will need to be seen in the office 2 weeks post discharge to coordinate consideration of may be repair of wrist 3. Blood loss anemia expected acute from surgery a. Monitor trends and transfuse if below threshold of 7 b. Will be placed on iron this admission because his hemoglobin dropped from 11-8 and will need further follow-up in the outpatient setting and repeat labs c. Etc. iron studies in 2 to 3 weeks 4. Lung cancer right upper lobe with metastases needing Port-A-Cath a. Scheduled for Port-A-Cath 11/8 b. Defer management of cancer to the outpatient setting at [facility] 5. COPD/emphysema a. Does not usually use oxygen he tells me b. Not on inhalers-monitor and add if needed 6. Possible UTI a. Multiple colony-forming units on urine therefore discontinue antibiotics 7. Anxiety a. Continue trazodone 50 at bedtime, Ativan every 8 as needed anxiety and prescription given on discharge 8. Multiple sclerosis with blindness a. Is dependent on family for care b. Probably will need skilled c. Outpatient follow-up 9. 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 Stable at this time  Procedures: Hip repair 05/18/2020 Consultations:  Dr. Rod Can  Discharge Exam: Vitals:   05/20/20 0450 05/20/20 0727  BP: (!) 143/61 (!) 147/85  Pulse: 85 89  Resp:  17  Temp: (!) 97.5 F (36.4 C) 97.7 F (36.5 C)  SpO2: 95% 94%    General: Awake alert distress EOMI NCAT nonfocal deficit Cardiovascular:  S1-S2 no murmur no rub no gallop Respiratory: Clinically clear no added sound no rales no rhonchi Abdomen soft  no rebound no guarding  Discharge Instructions   Discharge Instructions    Diet - low sodium heart healthy   Complete by: As directed    Discharge instructions   Complete by: As directed    Follow-up with your orthopedic surgeon in the outpatient setting for further care and management You will need labs in about 1 week at your primary care physician office You have been started on a blood thinner which will help prevent you from getting DVT as you will be somewhat immobile and blood stasis can cause blood clots-we will order home health to come out to your home and help you with mobilize and get treatment you need Please talk to your doctor about any other issues and see him in a week   Increase activity slowly   Complete by: As directed    No wound care   Complete by: As directed      Allergies as of 05/20/2020      Reactions   Demerol [meperidine Hcl]       Medication List    STOP taking these medications   HYDROcodone-acetaminophen 10-325 MG tablet Commonly known as: [place] these medications   apixaban 2.5 MG Tabs tablet Commonly known as: ELIQUIS Take 1 tablet (2.5 mg total) by mouth 2 (two) times daily.   feeding supplement Liqd Take 237 mLs by mouth 3 (three) times daily.   LORazepam 1 MG tablet Commonly known as: ATIVAN Take 1 tablet (1 mg total) by mouth every 8 (eight) hours as needed for anxiety.   methocarbamol 500 MG tablet Commonly known as: ROBAXIN Take 1 tablet (500 mg total) by mouth 4 (four) times daily.   multivitamin-lutein Caps capsule Take 1 capsule by mouth daily.   ondansetron 4 MG tablet Commonly known as: ZOFRAN Take 1 tablet (4 mg total) by mouth every 6 (six) hours as needed for nausea.   Oxycodone HCl 10 MG Tabs Take 1 tablet (10 mg total) by mouth every 4 (four) hours as needed for moderate pain.   pantoprazole 40 MG tablet Commonly known as: PROTONIX Take 1 tablet (40 mg total) by mouth daily.   polyethylene glycol 17  g packet Commonly known as: MIRALAX / GLYCOLAX Take 17 g by mouth daily.   senna-docusate 8.6-50 MG tablet Commonly known as: Senokot-S Take 2 tablets by mouth at bedtime.   traZODone 100 MG tablet Commonly known as: DESYREL Take 0.5-1 tablets (50-100 mg total) by mouth at bedtime.      Allergies  Allergen Reactions  . Demerol [Meperidine Hcl]     Follow-up Information    [place], Aaron Edelman, MD. Schedule an appointment as soon as possible for a visit in 2 weeks.   Specialty: Orthopedic Surgery Why: For wound re-check Contact information: [address] [place] [address] [phone]        [facility], [facility] Follow up.   Specialty: [place] Why: home health services arranged Contact information: [place] [address] [phone]                The results of significant diagnostics from this hospitalization (including imaging, microbiology, ancillary and laboratory) are listed below for reference.    Significant Diagnostic Studies: DG Chest 1 View  Result Date: 05/17/2020 CLINICAL DATA:  Initial evaluation for acute trauma, fall.  EXAM: CHEST  1 VIEW COMPARISON:  Prior radiograph from 05/11/2020. FINDINGS: Cardiac and mediastinal silhouettes are stable in size and contour, and may within normal limits. Aortic atherosclerosis. Lungs are hyperinflated with underlying emphysematous changes. No focal infiltrates. No edema or effusion. No pneumothorax. No acute osseous finding. IMPRESSION: 1. No active cardiopulmonary disease. 2. Aortic Atherosclerosis (ICD10-I70.0) and Emphysema (ICD10-J43.9). Electronically Signed   By: Jeannine Boga M.D.   On: 05/17/2020 04:27   DG Chest 2 View  Result Date: 05/11/2020 CLINICAL DATA:  Chest pain. EXAM: CHEST - 2 VIEW COMPARISON:  None. FINDINGS: The heart is normal in size. Mild tortuosity and calcification of the thoracic aorta. Slight ill-defined density in the right  suprahilar/right upper lobe region. This is indeterminate finding but chest CT suggested to further evaluate. Lungs demonstrate hyperinflation and emphysematous changes. No pleural effusions. IMPRESSION: 1. Ill-defined right suprahilar/right upper lobe density. Chest CT suggested for further evaluate. 2. Underlying emphysematous changes. Electronically Signed   By: Marijo Sanes M.D.   On: 05/11/2020 13:26   DG Wrist Complete Left  Result Date: 05/17/2020 CLINICAL DATA:  Initial evaluation for acute pain and swelling, fall downstairs. EXAM: LEFT WRIST - COMPLETE 3+ VIEW COMPARISON:  None. FINDINGS: Acute comminuted fracture of the distal right radius with slight impaction and dorsal angulation. Associated intra-articular extension. Distal ulna intact. Diffuse soft tissue swelling present about the wrist. IMPRESSION: Acute comminuted fracture of the distal right radius with intra-articular extension and dorsal angulation. Electronically Signed   By: Jeannine Boga M.D.   On: 05/17/2020 04:19   DG Ankle Complete Left  Result Date: 05/17/2020 CLINICAL DATA:  Initial evaluation for acute trauma, fall. EXAM: LEFT ANKLE COMPLETE - 3+ VIEW COMPARISON:  None. FINDINGS: No acute fracture dislocation. Ankle mortise approximated. Corticated osseous density at the medial malleolus consistent with a chronic finding. Diffuse soft tissue swelling present about the ankle. Underlying osteoarthritic changes noted. IMPRESSION: 1. No acute fracture or dislocation. 2. Diffuse soft tissue swelling about the ankle. Electronically Signed   By: Jeannine Boga M.D.   On: 05/17/2020 04:21   CT Head Wo Contrast  Result Date: 05/17/2020 CLINICAL DATA:  Status post trauma.  Fall. EXAM: CT HEAD WITHOUT CONTRAST TECHNIQUE: Contiguous axial images were obtained from the base of the skull through the vertex without intravenous contrast. COMPARISON:  Brain MRI 05/12/2020 FINDINGS: Brain: No evidence of acute infarction,  hemorrhage, hydrocephalus, extra-axial collection or mass lesion/mass effect. There is mild diffuse low-attenuation within the subcortical and periventricular white matter compatible with chronic microvascular disease. Prominence of the sulci and ventricles compatible with mild brain atrophy. Vascular: No hyperdense vessel or unexpected calcification. Skull: Normal. Negative for fracture or focal lesion. Sinuses/Orbits: No acute finding. Other: None. IMPRESSION: 1. No acute intracranial abnormalities. 2. Chronic small vessel ischemic change and brain atrophy. Electronically Signed   By: Kerby Moors M.D.   On: 05/17/2020 04:10   CT Chest Wo Contrast  Result Date: 05/11/2020 CLINICAL DATA:  Abnormal chest x-ray. EXAM: CT CHEST WITHOUT CONTRAST TECHNIQUE: Multidetector CT imaging of the chest was performed following the standard protocol without IV contrast. COMPARISON:  Chest x-ray, same date. FINDINGS: Cardiovascular: The heart is normal in size. No pericardial effusion. Mild tortuosity and ectasia of the thoracic aorta with scattered atherosclerotic calcifications. Scattered coronary artery calcifications. Mediastinum/Nodes: Right upper lobe lung mass is invading the right hilum and right mediastinum. I do not see any discrete measurable mediastinal lymph nodes. The esophagus is grossly normal. Lungs/Pleura: 3.2 cm spiculated  right upper lobe lung mass medially invading the right hilum and the right mediastinum. Findings consistent with primary lung neoplasm. Advanced emphysematous changes and areas of pulmonary scarring. There are several scattered subpleural pulmonary nodules noted which are indeterminate. None of these measures more than 5 mm. Metastatic disease is certainly a consideration. No acute pulmonary findings.  No pleural effusions. Upper Abdomen: Bilateral adrenal gland nodules and upper pole right renal lesion as seen on today's abdominal/pelvic CT scan. Musculoskeletal: No worrisome bone  lesions to suggest metastatic bone disease. IMPRESSION: 1. 3.2 cm spiculated right upper lobe lung mass invading the right hilum and right mediastinum consistent with primary lung neoplasm. 2. Several scattered subpleural pulmonary nodules are indeterminate. Metastatic disease is certainly a consideration. 3. Advanced emphysematous changes and pulmonary scarring. 4. Bilateral adrenal gland nodules and upper pole right renal lesion as seen on today's abdominal/pelvic CT scan. 5. No findings for osseous metastatic disease. 6. Emphysema and aortic atherosclerosis. Aortic Atherosclerosis (ICD10-I70.0) and Emphysema (ICD10-J43.9). Electronically Signed   By: Marijo Sanes M.D.   On: 05/11/2020 14:26   MR BRAIN W WO CONTRAST  Result Date: 05/12/2020 CLINICAL DATA:  Lung mass EXAM: MRI HEAD WITHOUT AND WITH CONTRAST TECHNIQUE: Multiplanar, multiecho pulse sequences of the brain and surrounding structures were obtained without and with intravenous contrast. CONTRAST:  96mL GADAVIST GADOBUTROL 1 MMOL/ML IV SOLN COMPARISON:  None. FINDINGS: Brain: There is no acute infarction or intracranial hemorrhage. There is no intracranial mass, mass effect, or edema. There is no hydrocephalus or extra-axial fluid collection. Prominence of the ventricles and sulci reflects minor generalized parenchymal volume loss. There is an incidental small right frontal developmental venous anomaly. 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 Patchy T2 hyperintensity in the supratentorial white matter is nonspecific but may reflect minor chronic microvascular ischemic changes. Vascular: Major vessel flow voids at the skull base are preserved. Skull and upper cervical spine: Normal marrow signal is preserved. Sinuses/Orbits: Paranasal sinuses are aerated. Orbits are unremarkable. Other: Sella is unremarkable.  Mastoid air cells are clear. IMPRESSION: No evidence of intracranial metastatic disease.  Chronic/nonemergent findings detailed above. Electronically Signed   By: Macy Mis M.D.   On: 05/12/2020 10:19   MR ABDOMEN W WO CONTRAST  Result Date: 05/14/2020 CLINICAL DATA:  Evaluate for liver metastases. EXAM: MRI ABDOMEN WITHOUT AND WITH CONTRAST TECHNIQUE: Multiplanar multisequence MR imaging of the abdomen was performed both before and after the administration of intravenous contrast. CONTRAST:  80mL GADAVIST GADOBUTROL 1 MMOL/ML IV SOLN COMPARISON:  CT AP 05/11/2020 FINDINGS: Lower chest: No acute findings. Hepatobiliary: Within segment 5 there is a T1 hypointense and mildly T2 hyperintense lesion adjacent to the gallbladder fossa which shows peripheral enhancement consistent with a metastatic lesion. This measures approximately 2.1 x 1.7 cm, image 49/16. Within the subcapsular aspect of the posterior right hepatic lobe there is a 1.3 x 0.7 cm T2 hyperintense and T1 hypointense structure without corresponding enhancement which is favored to represent a benign abnormality. The gallbladder is unremarkable. No bile duct dilatation identified. Pancreas: No mass, inflammatory changes, or other parenchymal abnormality identified. Spleen:  Within normal limits in size and appearance. Adrenals/Urinary Tract:  Normal appearance of the adrenal glands. Again seen are multiple peripherally enhancing right kidney lesions. The dominant lesion arises from the inferior pole of the right kidney measuring 5.1 x 5.4 cm, image 65/6. This is T1 hypointense, T2 isointense with mild peripheral enhancement. Similarly, arising from the medial cortex of the upper pole there is  a irregular mass measuring 3.4 x 2.5 cm which is T2 isointense, T1 hypointense, with peripheral enhancement. This lesion also contains a thin internal area of linear enhancement, image 48/16. Also arising from the upper pole of the right kidney is a small peripherally enhancing lesion which is T1 hypointense, teen 2 hyperintense and measures 1.9 x 1.5  cm, image 49/16. Small simple appearing cyst is identified involving the lateral cortex of the right kidney measuring 0.5 cm, image 18/4. 2 distinct lesions are noted arising from the upper pole of left kidney which have similar signal and enhancement characteristics to the suspicious right kidney lesions, image 34/18. The largest of these measures 1.5 cm. Stomach/Bowel: Visualized portions within the abdomen are unremarkable. Vascular/Lymphatic: No signs of renal vein involvement bilaterally. The IVC is patent without evidence for tumor thrombus. Aortic atherosclerosis. No aneurysm. No abdominal adenopathy. Other: No ascites or focal fluid collections identified within the imaged portions of the upper abdomen. Musculoskeletal: No suspicious bone lesions identified. IMPRESSION: 1. Again seen are multiple, bilateral peripherally enhancing kidney lesions. In the setting of a suspicious lung mass, the multiplicity, signal and enhancement characteristics of these lesions favor metastatic disease. Differential considerations include multiple cystic renal cell carcinomas versus multiple liver metastases. 2. Liver lesion adjacent to the gallbladder fossa has signal and enhancement characteristics favoring a metastatic lesion. Electronically Signed   By: Kerby Moors M.D.   On: 05/14/2020 05:11   CT Abdomen Pelvis W Contrast  Addendum Date: 05/11/2020   ADDENDUM REPORT: 05/11/2020 13:43 ADDENDUM: Of note, an alternative differential consideration for the bilateral renal masses is renal abscesses. The hypoechoic mass in the liver could represent a benign hemangioma, and MR abdomen according could be considered for further evaluation as part of the patient's workup. These results were called by telephone at the time of interpretation on 05/11/2020 at 1:42 pm to provider [facility] , who verbally acknowledged these results. Electronically Signed   By: Zerita Boers M.D.   On: 05/11/2020 13:43   Result Date:  05/11/2020 CLINICAL DATA:  Lower abdominal pain and generalized weakness for 2 weeks. EXAM: CT ABDOMEN AND PELVIS WITH CONTRAST TECHNIQUE: Multidetector CT imaging of the abdomen and pelvis was performed using the standard protocol following bolus administration of intravenous contrast. CONTRAST:  59mL OMNIPAQUE IOHEXOL 300 MG/ML  SOLN COMPARISON:  Report from abdominal radiograph dated 03/05/2002. FINDINGS: Lower chest: No acute abnormality. Hepatobiliary: There is a 1.6 cm hypoattenuating mass in hepatic segment 5/4B near the gallbladder fossa. No gallstones, gallbladder wall thickening, or biliary dilatation. Pancreas: Unremarkable. No pancreatic ductal dilatation or surrounding inflammatory changes. Spleen: Normal in size without focal abnormality. Adrenals/Urinary Tract: Two left adrenal nodules measure approximately 0.8 and 0.9 cm (series 5, image 49 and image 46). The right adrenal gland appears normal. Multiple indistinct hypoechoic masses enlarged the right kidney with the largest measuring 6.2 cm in diameter. A mass is seen in the right renal pelvis, measuring 2.9 cm (series 7, image 16). At least two hypoechoic masses are seen in the left kidney, measuring up to 1.7 cm. A 7 mm mass in the lateral right kidney likely represents a benign cyst (series 2, image 33). There is no hydronephrosis. Bladder is unremarkable. Stomach/Bowel: Stomach is within normal limits. Appendix appears normal. There is colonic diverticulosis without evidence of diverticulitis. No evidence of bowel wall thickening, distention, or inflammatory changes. Vascular/Lymphatic: Aortic atherosclerosis. The inferior vena cava and both renal veins appear normal. No enlarged abdominal or pelvic lymph nodes. Reproductive: The  prostate is mildly enlarged, measuring 5.1 cm in transverse dimension Other: No abdominal wall hernia or abnormality. No abdominopelvic ascites. Musculoskeletal: Degenerative changes are seen in the spine. IMPRESSION:  1. Multiple hypoechoic masses in both kidneys as well as a mass in the right renal pelvis. Differential considerations include renal cell carcinoma, transitional cell carcinoma, renal lymphoma, and metastatic disease. Indeterminate hypoattenuating mass in the liver and two left adrenal nodules may represent metastatic disease. CT abdomen pelvis according to a renal protocol could be considered for further evaluation. Aortic Atherosclerosis (ICD10-I70.0). Electronically Signed: By: Zerita Boers M.D. On: 05/11/2020 12:28   Pelvis Portable  Result Date: 05/18/2020 CLINICAL DATA:  Left total hip arthroplasty EXAM: PORTABLE PELVIS 1-2 VIEWS COMPARISON:  05/17/2020 left hip radiographs FINDINGS: Interval left total hip arthroplasty with no evidence of hardware fracture or loosening. No evidence of hip dislocation on these frontal views. No osseous fracture. No pelvic diastasis. No focal osseous lesions. Expected soft tissue gas surrounding the left hip. IMPRESSION: Satisfactory immediate postoperative frontal view appearance status post left total hip arthroplasty. Electronically Signed   By: Ilona Sorrel M.D.   On: 05/18/2020 15:44   CT FOOT LEFT WO CONTRAST  Result Date: 05/17/2020 CLINICAL DATA:  Ankle and foot injury question of fracture EXAM: CT OF THE LEFT FOOT WITHOUT CONTRAST TECHNIQUE: Multidetector CT imaging of the left foot was performed according to the standard protocol. Multiplanar CT image reconstructions were also generated. COMPARISON:  None. FINDINGS: Bones/Joint/Cartilage No fracture or dislocation. No widening of the Lisfranc joint is seen. There is diffuse osteopenia. Small well corticated ossicle seen at the adjacent to the medial malleolus. Mild tibiotalar joint osteoarthritis seen with joint space loss and subchondral sclerosis. First MTP joint osteoarthritis is with joint space. Ligaments Suboptimally assessed by CT. Muscles and Tendons The muscles surrounding the foot are intact without  focal atrophy or tear. The flexor and extensor tendons intact. The plantar fascia and Achilles tendon. Soft tissues Soft tissue swelling.  No ankle joint effusion is noted. IMPRESSION: No acute fracture or dislocation the ankle or foot. Electronically Signed   By: Prudencio Pair M.D.   On: 05/17/2020 21:24   CT ANKLE LEFT WO CONTRAST  Result Date: 05/17/2020 CLINICAL DATA:  Ankle and foot injury question of fracture EXAM: CT OF THE LEFT FOOT WITHOUT CONTRAST TECHNIQUE: Multidetector CT imaging of the left foot was performed according to the standard protocol. Multiplanar CT image reconstructions were also generated. COMPARISON:  None. FINDINGS: Bones/Joint/Cartilage No fracture or dislocation. No widening of the Lisfranc joint is seen. There is diffuse osteopenia. Small well corticated ossicle seen at the adjacent to the medial malleolus. Mild tibiotalar joint osteoarthritis seen with joint space loss and subchondral sclerosis. First MTP joint osteoarthritis is with joint space. Ligaments Suboptimally assessed by CT. Muscles and Tendons The muscles surrounding the foot are intact without focal atrophy or tear. The flexor and extensor tendons intact. The plantar fascia and Achilles tendon. Soft tissues Soft tissue swelling.  No ankle joint effusion is noted. IMPRESSION: No acute fracture or dislocation the ankle or foot. Electronically Signed   By: Prudencio Pair M.D.   On: 05/17/2020 21:24   MR FEMUR LEFT WO CONTRAST  Result Date: 05/17/2020 CLINICAL DATA:  Left femoral neck fracture after fall. History of metastatic lung cancer. EXAM: MR OF THE LEFT FEMUR WITHOUT CONTRAST TECHNIQUE: Multiplanar, multisequence MR imaging of the left femur was performed. No intravenous contrast was administered. COMPARISON:  Left hip x-rays from same day. CT  abdomen pelvis dated May 12, 2011. FINDINGS: Bones/Joint/Cartilage Acute mildly displaced left femoral neck fracture with apex anterior angulation again noted. No focal  bone lesion. No dislocation. No joint effusion. Muscles and Tendons Mild edema in the bilateral adductor muscles.  No muscle atrophy. Soft tissue No fluid collection or hematoma.  No soft tissue mass. IMPRESSION: 1. Acute mildly displaced left femoral neck fracture again noted. No underlying bone lesion. Electronically Signed   By: Titus Dubin M.D.   On: 05/17/2020 16:12   US BIOPSY (LIVER)  Result Date: 05/15/2020 INDICATION: Right hilar mass, liver lesion adjacent to the gallbladder EXAM: ULTRASOUND GUIDED CORE BIOPSY OF RIGHT LIVER LESION MEDICATIONS: 1% LIDOCAINE LOCAL ANESTHESIA/SEDATION: Fentanyl 50 mcg IV; Versed 1.0 mg IV Moderate Sedation Time:  10 MINUTES The patient was continuously monitored during the procedure by the interventional radiology nurse under my direct supervision. PROCEDURE: The procedure, risks, benefits, and alternatives were explained to the patient. Questions regarding the procedure were encouraged and answered. The patient understands and consents to the procedure. previous imaging reviewed. preliminary ultrasound performed. the solid hypoechoic lesion adjacent to the gallbladder in the right lobe was localized and marked. this is well below the subcostal margin. Under sterile conditions and local anesthesia, a 17 gauge 6.8 cm access was advanced to the lesion under direct ultrasound. Needle position confirmed with ultrasound. 3 18 gauge core biopsies obtained of the lesion under direct ultrasound. Samples were intact and non fragmented. These were placed in formalin. Needle tract occluded with Gel-Foam. Postprocedure imaging demonstrates no hemorrhage or hematoma. Patient tolerated biopsy well. COMPLICATIONS: None immediate. FINDINGS: Imaging confirms needle placed into the right liver lesion adjacent to the gallbladder for core biopsy IMPRESSION: Successful ultrasound right liver lesion 18 gauge core biopsy Electronically Signed   By: Jerilynn Mages.  Shick M.D.   On: 05/15/2020 15:56    US Venous Img Lower Bilateral (DVT)  Result Date: 05/12/2020 CLINICAL DATA:  Bilateral lower extremity pain.  Evaluate for DVT. EXAM: BILATERAL LOWER EXTREMITY VENOUS DOPPLER ULTRASOUND TECHNIQUE: Gray-scale sonography with graded compression, as well as color Doppler and duplex ultrasound were performed to evaluate the lower extremity deep venous systems from the level of the common femoral vein and including the common femoral, femoral, profunda femoral, popliteal and calf veins including the posterior tibial, peroneal and gastrocnemius veins when visible. The superficial great saphenous vein was also interrogated. Spectral Doppler was utilized to evaluate flow at rest and with distal augmentation maneuvers in the common femoral, femoral and popliteal veins. COMPARISON:  None. FINDINGS: RIGHT LOWER EXTREMITY Common Femoral Vein: No evidence of thrombus. Normal compressibility, respiratory phasicity and response to augmentation. Saphenofemoral Junction: No evidence of thrombus. Normal compressibility and flow on color Doppler imaging. Profunda Femoral Vein: No evidence of thrombus. Normal compressibility and flow on color Doppler imaging. Femoral Vein: No evidence of thrombus. Normal compressibility, respiratory phasicity and response to augmentation. Popliteal Vein: No evidence of thrombus. Normal compressibility, respiratory phasicity and response to augmentation. Calf Veins: No evidence of thrombus. Normal compressibility and flow on color Doppler imaging. Superficial Great Saphenous Vein: No evidence of thrombus. Normal compressibility. Venous Reflux:  None. Other Findings:  None. LEFT LOWER EXTREMITY Common Femoral Vein: No evidence of thrombus. Normal compressibility, respiratory phasicity and response to augmentation. Saphenofemoral Junction: No evidence of thrombus. Normal compressibility and flow on color Doppler imaging. Profunda Femoral Vein: No evidence of thrombus. Normal compressibility and flow  on color Doppler imaging. Femoral Vein: No evidence of thrombus. Normal compressibility, respiratory phasicity and  response to augmentation. Popliteal Vein: No evidence of thrombus. Normal compressibility, respiratory phasicity and response to augmentation. Calf Veins: No evidence of thrombus. Normal compressibility and flow on color Doppler imaging. Superficial Great Saphenous Vein: No evidence of thrombus. Normal compressibility. Venous Reflux:  None. Other Findings:  None. IMPRESSION: No evidence of DVT within either lower extremity. Electronically Signed   By: Sandi Mariscal M.D.   On: 05/12/2020 17:38   DG Foot Complete Left  Result Date: 05/17/2020 CLINICAL DATA:  Initial evaluation for acute trauma, fall. EXAM: LEFT FOOT - COMPLETE 3+ VIEW COMPARISON:  None. FINDINGS: No acute fracture or dislocation. Joint spaces maintained. Mild soft tissue swelling about the midfoot and ankle. 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 IMPRESSION: 1. No acute fracture or dislocation. 2. Mild soft tissue swelling about the midfoot and ankle. Electronically Signed   By: Jeannine Boga M.D.   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 IMPRESSION: Intraoperative fluoroscopic guidance for left total hip arthroplasty. Electronically Signed   By: Ilona Sorrel M.D.   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 IMPRESSION: Intraoperative fluoroscopic guidance for left total hip arthroplasty. Electronically Signed   By: Ilona Sorrel M.D.   On: 05/18/2020 15:32   DG Hip Unilat W or Wo Pelvis 2-3 Views Left  Result Date: 05/17/2020 CLINICAL DATA:  Initial evaluation for acute trauma, fall. EXAM: DG HIP (WITH OR WITHOUT PELVIS) 2-3V LEFT COMPARISON:  None. FINDINGS: Acute fracture extends through the left femoral neck with slight superior subluxation. Femoral head remains in normal alignment with the acetabulum. Acetabulum itself appears intact. Bony pelvis intact. Limited views of the right hip demonstrate no acute finding. Underlying osteopenia. Scattered vascular calcifications noted about the inguinal regions bilaterally. IMPRESSION: Acute mildly displaced fracture of the left femoral neck. Electronically Signed   By: Jeannine Boga M.D.   On: 05/17/2020 04:22    Microbiology: Recent Results (from the past 240 hour(s))  Respiratory Panel by RT PCR (Flu A&B, Covid) - Nasopharyngeal Swab     Status: None   Collection Time: 05/11/20  1:21 PM   Specimen: Nasopharyngeal Swab  Result Value Ref Range Status   SARS Coronavirus 2 by RT PCR NEGATIVE NEGATIVE Final    Comment: (NOTE) SARS-CoV-2 target nucleic acids are NOT DETECTED.  The SARS-CoV-2 RNA is generally detectable in upper respiratoy specimens during the acute phase of infection. The lowest concentration of SARS-CoV-2 viral copies this assay can detect is 131 copies/mL. A negative result does not preclude SARS-Cov-2 infection and should not be used as the sole basis for treatment or other patient management decisions. A negative result may occur with  improper specimen  collection/handling, submission of specimen other than nasopharyngeal swab,  presence of viral mutation(s) within the areas targeted by this assay, and inadequate number of viral copies (<131 copies/mL). A negative result must be combined with clinical observations, patient history, and epidemiological information. The expected result is Negative.  Fact Sheet for Patients:  PinkCheek.be  Fact Sheet for Healthcare Providers:  GravelBags.it  This test is no t yet approved or cleared by the [place] FDA and  has been authorized for detection and/or diagnosis of SARS-CoV-2 by FDA under an Emergency Use Authorization (EUA). This EUA will remain  in effect (meaning this test can be used) for the duration of the COVID-19 declaration under Section 564(b)(1) of the Act, 21 U.S.C. section 360bbb-3(b)(1), unless the authorization is terminated or revoked sooner.     Influenza A by PCR NEGATIVE NEGATIVE Final   Influenza B by PCR NEGATIVE NEGATIVE Final    Comment: (NOTE) The Xpert Xpress SARS-CoV-2/FLU/RSV assay is intended as an aid in  the diagnosis of influenza from Nasopharyngeal swab specimens and  should not be used as a sole basis for treatment. Nasal washings and  aspirates are unacceptable for Xpert Xpress SARS-CoV-2/FLU/RSV  testing.  Fact Sheet for Patients: PinkCheek.be  Fact Sheet for Healthcare Providers: GravelBags.it  This test is not yet approved or cleared by the [place] FDA and  has been authorized for detection and/or diagnosis of SARS-CoV-2 by  FDA under an Emergency Use Authorization (EUA). This EUA will remain  in effect (meaning this test can be used) for the duration of the  Covid-19 declaration under Section 564(b)(1) of the Act, 21  U.S.C. section 360bbb-3(b)(1), unless the authorization is  terminated or revoked. Performed at [facility], [address] [place], [place] [address]   Respiratory Panel by RT PCR (Flu A&B, Covid) - Nasopharyngeal Swab     Status: None   Collection Time: 05/17/20  5:50 AM   Specimen: Nasopharyngeal Swab  Result Value Ref Range Status   SARS Coronavirus 2 by RT PCR NEGATIVE NEGATIVE Final    Comment: (NOTE) SARS-CoV-2 target nucleic acids are NOT DETECTED.  The SARS-CoV-2 RNA is generally detectable in upper respiratoy specimens during the acute phase of infection. The lowest concentration of SARS-CoV-2 viral copies this assay can detect is 131 copies/mL. A negative result does not preclude SARS-Cov-2 infection and should not be used as the sole basis for treatment or other patient management decisions. A negative result may occur with  improper specimen collection/handling, submission of specimen other than nasopharyngeal swab, presence of viral mutation(s) within the areas targeted by this assay, and inadequate number of viral copies (<131 copies/mL). A negative result must be combined with clinical observations, patient history, and epidemiological information. The expected result is Negative.  Fact Sheet for Patients:  PinkCheek.be  Fact Sheet for Healthcare Providers:  GravelBags.it  This test is no t yet approved or cleared by the [place] FDA and  has been authorized for detection and/or diagnosis of SARS-CoV-2 by FDA under an Emergency Use Authorization (EUA). This EUA will remain  in effect (meaning this test can be used) for the duration of the COVID-19 declaration under Section 564(b)(1) of the Act, 21 U.S.C. section 360bbb-3(b)(1), unless the authorization is terminated or revoked sooner.     Influenza A by PCR NEGATIVE NEGATIVE Final   Influenza B by PCR NEGATIVE NEGATIVE Final    Comment: (NOTE) The Xpert Xpress SARS-CoV-2/FLU/RSV assay is intended as an aid in  the diagnosis of influenza from  Nasopharyngeal swab specimens and  should not be used as a sole basis for treatment. Nasal washings and  aspirates are unacceptable for Xpert Xpress SARS-CoV-2/FLU/RSV  testing.  Fact Sheet for Patients: PinkCheek.be  Fact Sheet for Healthcare Providers: GravelBags.it  This test is not yet approved or cleared by the [place] FDA and  has been authorized for detection and/or diagnosis of SARS-CoV-2 by  FDA under an Emergency Use Authorization (EUA). This EUA will remain  in effect (meaning this test can be used) for the duration of the  Covid-19 declaration under Section 564(b)(1) of the Act, 21  U.S.C. section 360bbb-3(b)(1), unless the authorization is  terminated or revoked. Performed at [facility], [address] [place], [place] [address]   Urine Culture     Status: Abnormal   Collection Time: 05/17/20  8:06 AM   Specimen: Urine, Clean Catch  Result Value Ref Range Status   Specimen Description   Final    URINE, CLEAN CATCH Performed at [facility], [address] [place], [place] [address]    Special Requests   Final    Normal Performed at [facility], [address], [place], [place] [address]    Culture MULTIPLE SPECIES PRESENT, SUGGEST RECOLLECTION (A)  Final   Report Status 05/18/2020 FINAL  Final  Surgical pcr screen     Status: None   Collection Time: 05/18/20 12:31 AM   Specimen: Nasal Mucosa; Nasal Swab  Result Value Ref Range Status   MRSA, PCR NEGATIVE NEGATIVE Final   Staphylococcus aureus NEGATIVE NEGATIVE Final    Comment: (NOTE) The Xpert SA Assay (FDA approved for NASAL specimens in patients 33 years of age and older), is one component of a comprehensive surveillance program. It is not intended to diagnose infection nor to guide or monitor treatment. Performed at [facility], [place] [address] [place], [place] [address]      Labs: Basic Metabolic Panel: Recent Labs  Lab  05/14/20 0505 05/14/20 0505 05/15/20 0449 05/16/20 0647 05/17/20 0446 05/18/20 0221 05/19/20 0301  NA 137   < > 136 137 134* 139 137  K 3.8   < > 4.0 3.9 3.8 3.8 4.4  CL 100   < > 100 99 98 102 103  CO2 26   < > 26 28 25 27 25   GLUCOSE 122*   < > 102* 112* 142* 120* 119*  BUN 10   < > 15 17 14 14 23   CREATININE 0.66   < > 0.61 0.53* 0.54* 0.59* 0.76  CALCIUM 9.8   < > 9.3 9.3 9.5 9.5 8.9  MG 1.9  --  1.9  --   --   --   --    < > = values in this interval not displayed.   Liver Function Tests: Recent Labs  Lab 05/14/20 0505 05/15/20 0449 05/16/20 0647 05/17/20 0446 05/19/20 0301  AST 27 26 35 43* 31  ALT 35 34 40 64* 39  ALKPHOS 143* 115 119 120 103  BILITOT 0.6 0.6 0.5 0.5 0.5  PROT 7.3 6.3* 6.4* 6.6 5.5*  ALBUMIN 3.3* 3.0* 3.0* 3.2* 2.6*   No results for input(s): LIPASE, AMYLASE in the last 168 hours. No results for input(s): AMMONIA in the last 168 hours. CBC: Recent Labs  Lab 05/14/20 0505 05/14/20 0505 05/15/20 0449 05/15/20 0449 05/16/20 0647 05/17/20 0446 05/18/20 0221 05/19/20 0301 05/20/20 0051  WBC 11.5*   < > 9.9   < > 10.6* 16.8* 11.8* 16.3* 12.1*  NEUTROABS 8.4*  --  6.9  --  7.4 14.9*  --   --   --   HGB 13.5   < > 11.4*   < > 11.7* 12.0* 11.8* 8.3* 8.1*  HCT 42.4   < > 36.2*   < > 36.5* 37.9* 35.8* 26.1* 25.5*  MCV 102.4*   < > 103.1*   < > 101.4* 102.2* 100.0 101.6* 101.2*  PLT 411*   < > 299   < > 365 331 311 281 278   < > = values in this interval not displayed.   Cardiac Enzymes: No results for input(s): CKTOTAL, CKMB, CKMBINDEX, TROPONINI in the last 168 hours. BNP: BNP (last 3 results) No results for input(s): BNP in the last 8760 hours.  ProBNP (last 3 results) No results for input(s): PROBNP in the last 8760 hours.  CBG: Recent Labs  Lab 05/19/20 0624 05/19/20 1201 05/19/20 1818 05/20/20 0010 05/20/20 0641  GLUCAP 119* 107* 131* 110* 105*       Signed:  Nita Sells MD   [facility] 05/20/2020,  8:38 AM

## 2020-05-28 ENCOUNTER — Other Ambulatory Visit: Payer: Self-pay

## 2020-05-28 ENCOUNTER — Inpatient Hospital Stay (HOSPITAL_COMMUNITY): Payer: Medicare HMO | Attending: Hematology | Admitting: Hematology

## 2020-05-28 ENCOUNTER — Encounter (HOSPITAL_COMMUNITY): Payer: Self-pay | Admitting: Hematology

## 2020-05-28 VITALS — BP 110/59 | HR 95 | Temp 98.6°F | Resp 16

## 2020-05-28 DIAGNOSIS — Z79899 Other long term (current) drug therapy: Secondary | ICD-10-CM | POA: Insufficient documentation

## 2020-05-28 DIAGNOSIS — R200 Anesthesia of skin: Secondary | ICD-10-CM | POA: Diagnosis not present

## 2020-05-28 DIAGNOSIS — C3401 Malignant neoplasm of right main bronchus: Secondary | ICD-10-CM | POA: Diagnosis present

## 2020-05-28 DIAGNOSIS — I6782 Cerebral ischemia: Secondary | ICD-10-CM | POA: Diagnosis not present

## 2020-05-28 DIAGNOSIS — R058 Other specified cough: Secondary | ICD-10-CM | POA: Insufficient documentation

## 2020-05-28 DIAGNOSIS — Z885 Allergy status to narcotic agent status: Secondary | ICD-10-CM | POA: Insufficient documentation

## 2020-05-28 DIAGNOSIS — R5383 Other fatigue: Secondary | ICD-10-CM | POA: Insufficient documentation

## 2020-05-28 DIAGNOSIS — J984 Other disorders of lung: Secondary | ICD-10-CM | POA: Insufficient documentation

## 2020-05-28 DIAGNOSIS — C3491 Malignant neoplasm of unspecified part of right bronchus or lung: Secondary | ICD-10-CM

## 2020-05-28 DIAGNOSIS — C787 Secondary malignant neoplasm of liver and intrahepatic bile duct: Secondary | ICD-10-CM | POA: Diagnosis not present

## 2020-05-28 DIAGNOSIS — Z7901 Long term (current) use of anticoagulants: Secondary | ICD-10-CM | POA: Insufficient documentation

## 2020-05-28 DIAGNOSIS — S82891D Other fracture of right lower leg, subsequent encounter for closed fracture with routine healing: Secondary | ICD-10-CM | POA: Diagnosis not present

## 2020-05-28 DIAGNOSIS — G35 Multiple sclerosis: Secondary | ICD-10-CM | POA: Insufficient documentation

## 2020-05-28 DIAGNOSIS — M8580 Other specified disorders of bone density and structure, unspecified site: Secondary | ICD-10-CM | POA: Insufficient documentation

## 2020-05-28 DIAGNOSIS — I700 Atherosclerosis of aorta: Secondary | ICD-10-CM | POA: Insufficient documentation

## 2020-05-28 DIAGNOSIS — R0602 Shortness of breath: Secondary | ICD-10-CM | POA: Insufficient documentation

## 2020-05-28 DIAGNOSIS — K5730 Diverticulosis of large intestine without perforation or abscess without bleeding: Secondary | ICD-10-CM | POA: Insufficient documentation

## 2020-05-28 DIAGNOSIS — S82892D Other fracture of left lower leg, subsequent encounter for closed fracture with routine healing: Secondary | ICD-10-CM | POA: Diagnosis not present

## 2020-05-28 DIAGNOSIS — K769 Liver disease, unspecified: Secondary | ICD-10-CM | POA: Diagnosis not present

## 2020-05-28 DIAGNOSIS — M549 Dorsalgia, unspecified: Secondary | ICD-10-CM | POA: Insufficient documentation

## 2020-05-28 DIAGNOSIS — S72002A Fracture of unspecified part of neck of left femur, initial encounter for closed fracture: Secondary | ICD-10-CM | POA: Insufficient documentation

## 2020-05-28 DIAGNOSIS — R609 Edema, unspecified: Secondary | ICD-10-CM | POA: Diagnosis not present

## 2020-05-28 DIAGNOSIS — R079 Chest pain, unspecified: Secondary | ICD-10-CM | POA: Diagnosis not present

## 2020-05-28 NOTE — Progress Notes (Signed)
START ON PATHWAY REGIMEN - Non-Small Cell Lung     A cycle is every 21 days:     Pembrolizumab      Paclitaxel      Carboplatin   **Always confirm dose/schedule in your pharmacy ordering system**  Patient Characteristics: Stage IV Metastatic, Squamous, PS = 0, 1, First Line, PD-L1 Expression Positive 1-49% (TPS) / Negative / Not Tested / Awaiting Test Results and Immunotherapy Candidate Therapeutic Status: Stage IV Metastatic Histology: Squamous Cell Line of therapy: First Line ECOG Performance Status: 1 PD-L1 Expression Status: PD-L1 Negative Immunotherapy Candidate Status: Candidate for Immunotherapy Intent of Therapy: Non-Curative / Palliative Intent, Discussed with Patient

## 2020-05-28 NOTE — Patient Instructions (Addendum)
[place] at [facility] Discharge Instructions  You were seen today by Dr. Delton Coombes. 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 This cancer is too far advanced to be cured, so the treatment protocol will consist of chemotherapy medications called carboplatin and paclitaxel, along with immunotherapy called Keytruda. The medications will be administered every 3 weeks via the port. Continue drinking plenty of water and eat your meals to maintain your weight. Continue going to physical therapy to maintain your strength. Dr. Delton Coombes will see you back in 3-4 weeks for labs and beginning treatment.   Thank you for choosing [place] at [facility] to provide your oncology and hematology care.  To afford each patient quality time with our provider, please arrive at least 15 minutes before your scheduled appointment time.   If you have a lab appointment with the [facility] please come in thru the Main Entrance and check in at the main information desk  You need to re-schedule your appointment should you arrive 10 or more minutes late.  We strive to give you quality time with our providers, and arriving late affects you and other patients whose appointments are after yours.  Also, if you no show three or more times for appointments you may be dismissed from the clinic at the providers discretion.     Again, thank you for choosing [facility].  Our hope is that these requests will decrease the amount of time that you wait before being seen by our physicians.       _____________________________________________________________  Should you have questions after your visit to [facility], please contact our office at [phone] between the hours of 8:00 a.m. and 4:30 p.m.  Voicemails left after 4:00 p.m. will not be returned until the following business day.  For  prescription refill requests, have your pharmacy contact our office and allow 72 hours.    Cancer Center Support Programs:   > Cancer Support Group  2nd Tuesday of the month 1pm-2pm, Journey Room

## 2020-05-28 NOTE — Progress Notes (Addendum)
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    CANCER STAGING: Cancer Staging No matching staging information was found for the patient.  INTERVAL HISTORY:  [name], a 69 y.o. male, returns for routine follow-up of his metastatic right squamous cell lung cancer to liver. Brandon Castillo was last seen on 05/12/2020.   Today he is accompanied by his brother. He reports having pain in his back and left leg where he had a hip replacement. He has been working with PT and walking, though he gets SOB with activity. He developed bed sores after lying in bed after his hip replacement. He is having a productive cough since getting anesthesia. His appetite is excellent and he is eating everything; he also drinks 3 cans of Ensure daily. He is willing to go through chemo treatment.  He lives with his brother now.   REVIEW OF SYSTEMS:  Review of Systems  Constitutional: Positive for fatigue (75%). Negative for appetite change.  HENT:         Blind  Respiratory: Positive for cough (productive) and shortness of breath (w/ exertion).   Cardiovascular: Positive for chest pain (at port site).  Musculoskeletal: Positive for back pain (6/10 back & L leg pain).  Neurological: Positive for numbness (at L leg incision).  All other systems reviewed and are negative.   PAST MEDICAL/SURGICAL HISTORY:  Past Medical History:  Diagnosis Date  . Cancer Brandon Castillo)    Diagnosed yesterday  . Multiple sclerosis ([place])    Past Surgical History:  Procedure Laterality Date  . IR IMAGING GUIDED PORT INSERTION  05/20/2020   right  . TOTAL HIP ARTHROPLASTY  Left 05/18/2020   Procedure: TOTAL HIP ARTHROPLASTY ANTERIOR APPROACH;  Surgeon: Rod Can, MD;  Location: [facility];  Service: Orthopedics;  Laterality: Left;    SOCIAL HISTORY:  Social History   Socioeconomic History  . Marital status: Single    Spouse name: Not on file  . Number of children: Not on file  . Years of education: Not on file  . Highest education level: Not on file  Occupational History  . Not on file  Tobacco Use  . Smoking status: Never Smoker  . Smokeless tobacco: Never Used  Vaping Use  . Vaping Use: Never used  Substance and Sexual Activity  . Alcohol use: Not Currently  . Drug use: Not Currently  . Sexual activity: Not on file  Other Topics Concern  . Not on file  Social History Narrative  . Not on file   Social Determinants of Health   Financial Resource Strain:   . Difficulty of Paying Living Expenses: Not on file  Food Insecurity:   . Worried About Charity fundraiser in the Last Year: Not on file  . Ran Out of Food in the Last Year: Not on file  Transportation Needs:   . Lack of Transportation (Medical): Not on file  . Lack of Transportation (Non-Medical): Not on file  Physical Activity:   . Days of Exercise per Week: Not on file  . Minutes of Exercise per Session: Not on file  Stress:   .  Feeling of Stress : Not on file  Social Connections:   . Frequency of Communication with Friends and Family: Not on file  . Frequency of Social Gatherings with Friends and Family: Not on file  . Attends Religious Services: Not on file  . Active Member of Clubs or Organizations: Not on file  . Attends Archivist Meetings: Not on file  . Marital Status: Not on file  Intimate Partner Violence:   . Fear of Current or Ex-Partner: Not on file  . Emotionally Abused: Not on file  . Physically Abused: Not on file  . Sexually Abused: Not on file    FAMILY HISTORY:  No family history on file.  CURRENT MEDICATIONS:  Current Outpatient Medications   Medication Sig Dispense Refill  . apixaban (ELIQUIS) 2.5 MG TABS tablet Take 1 tablet (2.5 mg total) by mouth 2 (two) times daily. 60 tablet 0  . feeding supplement (ENSURE ENLIVE / ENSURE PLUS) LIQD Take 237 mLs by mouth 3 (three) times daily. 23700 mL 12  . ferrous sulfate 325 (65 FE) MG tablet Take 1 tablet (325 mg total) by mouth 2 (two) times daily with a meal. 60 tablet 11  . LORazepam (ATIVAN) 1 MG tablet Take 1 tablet (1 mg total) by mouth every 8 (eight) hours as needed for anxiety. 30 tablet 0  . methocarbamol (ROBAXIN) 500 MG tablet Take 1 tablet (500 mg total) by mouth 4 (four) times daily. 32 tablet 0  . multivitamin-lutein (OCUVITE-LUTEIN) CAPS capsule Take 1 capsule by mouth daily. 30 capsule 2  . oxyCODONE 10 MG TABS Take 1 tablet (10 mg total) by mouth every 4 (four) hours as needed for moderate pain. 30 tablet 0  . pantoprazole (PROTONIX) 40 MG tablet Take 1 tablet (40 mg total) by mouth daily. 30 tablet 3  . polyethylene glycol (MIRALAX / GLYCOLAX) 17 g packet Take 17 g by mouth daily. 30 each 1  . senna-docusate (SENOKOT-S) 8.6-50 MG tablet Take 2 tablets by mouth at bedtime. 60 tablet 1  . traZODone (DESYREL) 100 MG tablet Take 0.5-1 tablets (50-100 mg total) by mouth at bedtime. 30 tablet 1  . ondansetron (ZOFRAN) 4 MG tablet Take 1 tablet (4 mg total) by mouth every 6 (six) hours as needed for nausea. (Patient not taking: Reported on 05/28/2020) 20 tablet 0   No current facility-administered medications for this visit.    ALLERGIES:  Allergies  Allergen Reactions  . Demerol [Meperidine Hcl]     PHYSICAL EXAM:  Performance status (ECOG): 3 - Symptomatic, >50% confined to bed  Vitals:   05/28/20 0912  BP: (!) 110/59  Pulse: 95  Resp: 16  Temp: 98.6 F (37 C)  SpO2: 99%   Wt Readings from Last 3 Encounters:  05/17/20 149 lb 14.6 oz (68 kg)  05/11/20 150 lb (68 kg)   Physical Exam Vitals reviewed.  Constitutional:      Appearance: Normal appearance.      Comments: In wheelchair  Cardiovascular:     Rate and Rhythm: Normal rate and regular rhythm.     Pulses: Normal pulses.     Heart sounds: Normal heart sounds.  Pulmonary:     Effort: Pulmonary effort is normal.     Breath sounds: Normal breath sounds.  Chest:     Comments: Port-a-Cath in R chest Musculoskeletal:     Right lower leg: No edema.     Left lower leg: Edema (1+) present.  Neurological:     General: No  focal deficit present.     Mental Status: He is alert and oriented to person, place, and time.  Psychiatric:        Mood and Affect: Mood normal.        Behavior: Behavior normal.      LABORATORY DATA:  I have reviewed the labs as listed.  CBC Latest Ref Rng & Units 05/20/2020 05/19/2020 05/18/2020  WBC 4.0 - 10.5 K/uL 12.1(H) 16.3(H) 11.8(H)  Hemoglobin 13.0 - 17.0 g/dL 8.1(L) 8.3(L) 11.8(L)  Hematocrit 39 - 52 % 25.5(L) 26.1(L) 35.8(L)  Platelets 150 - 400 K/uL 278 281 311   CMP Latest Ref Rng & Units 05/19/2020 05/18/2020 05/17/2020  Glucose 70 - 99 mg/dL 119(H) 120(H) 142(H)  BUN 8 - 23 mg/dL 23 14 14   Creatinine 0.61 - 1.24 mg/dL 0.76 0.59(L) 0.54(L)  Sodium 135 - 145 mmol/L 137 139 134(L)  Potassium 3.5 - 5.1 mmol/L 4.4 3.8 3.8  Chloride 98 - 111 mmol/L 103 102 98  CO2 22 - 32 mmol/L 25 27 25   Calcium 8.9 - 10.3 mg/dL 8.9 9.5 9.5  Total Protein 6.5 - 8.1 g/dL 5.5(L) - 6.6  Total Bilirubin 0.3 - 1.2 mg/dL 0.5 - 0.5  Alkaline Phos 38 - 126 U/L 103 - 120  AST 15 - 41 U/L 31 - 43(H)  ALT 0 - 44 U/L 39 - 64(H)   Surgical pathology ([id number]) on 05/15/2020: Right liver mass: metastatic squamous cell lung carcinoma.  DIAGNOSTIC IMAGING:  I have independently reviewed the scans and discussed with the patient. DG Chest 2 View  Result Date: 05/11/2020 CLINICAL DATA:  Chest pain. EXAM: CHEST - 2 VIEW COMPARISON:  None. FINDINGS: The heart is normal in size. Mild tortuosity and calcification of the thoracic aorta. Slight ill-defined density in the right  suprahilar/right upper lobe region. This is indeterminate finding but chest CT suggested to further evaluate. Lungs demonstrate hyperinflation and emphysematous changes. No pleural effusions. IMPRESSION: 1. Ill-defined right suprahilar/right upper lobe density. Chest CT suggested for further evaluate. 2. Underlying emphysematous changes. Electronically Signed   By: Marijo Sanes M.D.   On: 05/11/2020 13:26   DG Wrist Complete Left  Result Date: 05/17/2020 CLINICAL DATA:  Initial evaluation for acute pain and swelling, fall downstairs. EXAM: LEFT WRIST - COMPLETE 3+ VIEW COMPARISON:  None. FINDINGS: Acute comminuted fracture of the distal right radius with slight impaction and dorsal angulation. Associated intra-articular extension. Distal ulna intact. Diffuse soft tissue swelling present about the wrist. IMPRESSION: Acute comminuted fracture of the distal right radius with intra-articular extension and dorsal angulation. Electronically Signed   By: Jeannine Boga M.D.   On: 05/17/2020 04:19   DG Ankle Complete Left  Result Date: 05/17/2020 CLINICAL DATA:  Initial evaluation for acute trauma, fall. EXAM: LEFT ANKLE COMPLETE - 3+ VIEW COMPARISON:  None. FINDINGS: No acute fracture dislocation. Ankle mortise approximated. Corticated osseous density at the medial malleolus consistent with a chronic finding. Diffuse soft tissue swelling present about the ankle. Underlying osteoarthritic changes noted. IMPRESSION: 1. No acute fracture or dislocation. 2. Diffuse soft tissue swelling about the ankle. Electronically Signed   By: Jeannine Boga M.D.   On: 05/17/2020 04:21   CT Head Wo Contrast  Result Date: 05/17/2020 CLINICAL DATA:  Status post trauma.  Fall. EXAM: CT HEAD WITHOUT CONTRAST TECHNIQUE: Contiguous axial images were obtained from the base of the skull through the vertex without intravenous contrast. COMPARISON:  Brain MRI 05/12/2020 FINDINGS: Brain: No evidence of acute infarction,  hemorrhage, hydrocephalus,  extra-axial collection or mass lesion/mass effect. There is mild diffuse low-attenuation within the subcortical and periventricular white matter compatible with chronic microvascular disease. Prominence of the sulci and ventricles compatible with mild brain atrophy. Vascular: No hyperdense vessel or unexpected calcification. Skull: Normal. Negative for fracture or focal lesion. Sinuses/Orbits: No acute finding. Other: None. IMPRESSION: 1. No acute intracranial abnormalities. 2. Chronic small vessel ischemic change and brain atrophy. Electronically Signed   By: Kerby Moors M.D.   On: 05/17/2020 04:10   CT Chest Wo Contrast  Result Date: 05/11/2020 CLINICAL DATA:  Abnormal chest x-ray. EXAM: CT CHEST WITHOUT CONTRAST TECHNIQUE: Multidetector CT imaging of the chest was performed following the standard protocol without IV contrast. COMPARISON:  Chest x-ray, same date. FINDINGS: Cardiovascular: The heart is normal in size. No pericardial effusion. Mild tortuosity and ectasia of the thoracic aorta with scattered atherosclerotic calcifications. Scattered coronary artery calcifications. Mediastinum/Nodes: Right upper lobe lung mass is invading the right hilum and right mediastinum. I do not see any discrete measurable mediastinal lymph nodes. The esophagus is grossly normal. Lungs/Pleura: 3.2 cm spiculated right upper lobe lung mass medially invading the right hilum and the right mediastinum. Findings consistent with primary lung neoplasm. Advanced emphysematous changes and areas of pulmonary scarring. There are several scattered subpleural pulmonary nodules noted which are indeterminate. None of these measures more than 5 mm. Metastatic disease is certainly a consideration. No acute pulmonary findings.  No pleural effusions. Upper Abdomen: Bilateral adrenal gland nodules and upper pole right renal lesion as seen on today's abdominal/pelvic CT scan. Musculoskeletal: No worrisome bone  lesions to suggest metastatic bone disease. IMPRESSION: 1. 3.2 cm spiculated right upper lobe lung mass invading the right hilum and right mediastinum consistent with primary lung neoplasm. 2. Several scattered subpleural pulmonary nodules are indeterminate. Metastatic disease is certainly a consideration. 3. Advanced emphysematous changes and pulmonary scarring. 4. Bilateral adrenal gland nodules and upper pole right renal lesion as seen on today's abdominal/pelvic CT scan. 5. No findings for osseous metastatic disease. 6. Emphysema and aortic atherosclerosis. Aortic Atherosclerosis (ICD10-I70.0) and Emphysema (ICD10-J43.9). Electronically Signed   By: Marijo Sanes M.D.   On: 05/11/2020 14:26   MR BRAIN W WO CONTRAST  Result Date: 05/12/2020 CLINICAL DATA:  Lung mass EXAM: MRI HEAD WITHOUT AND WITH CONTRAST TECHNIQUE: Multiplanar, multiecho pulse sequences of the brain and surrounding structures were obtained without and with intravenous contrast. CONTRAST:  60m GADAVIST GADOBUTROL 1 MMOL/ML IV SOLN COMPARISON:  None. FINDINGS: Brain: There is no acute infarction or intracranial hemorrhage. There is no intracranial mass, mass effect, or edema. There is no hydrocephalus or extra-axial fluid collection. Prominence of the ventricles and sulci reflects minor generalized parenchymal volume loss. There is an incidental small right frontal developmental venous anomaly. 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 Patchy T2 hyperintensity in the supratentorial white matter is nonspecific but may reflect minor chronic microvascular ischemic changes. Vascular: Major vessel flow voids at the skull base are preserved. Skull and upper cervical spine: Normal marrow signal is preserved. Sinuses/Orbits: Paranasal sinuses are aerated. Orbits are unremarkable. Other: Sella is unremarkable.  Mastoid air cells are clear. IMPRESSION: No evidence of intracranial metastatic disease.  Chronic/nonemergent findings detailed above. Electronically Signed   By: PMacy MisM.D.   On: 05/12/2020 10:19   MR ABDOMEN W WO CONTRAST  Result Date: 05/14/2020 CLINICAL DATA:  Evaluate for liver metastases. EXAM: MRI ABDOMEN WITHOUT AND WITH CONTRAST TECHNIQUE: Multiplanar multisequence MR imaging of the abdomen was performed  both before and after the administration of intravenous contrast. CONTRAST:  18m GADAVIST GADOBUTROL 1 MMOL/ML IV SOLN COMPARISON:  CT AP 05/11/2020 FINDINGS: Lower chest: No acute findings. Hepatobiliary: Within segment 5 there is a T1 hypointense and mildly T2 hyperintense lesion adjacent to the gallbladder fossa which shows peripheral enhancement consistent with a metastatic lesion. This measures approximately 2.1 x 1.7 cm, image 49/16. Within the subcapsular aspect of the posterior right hepatic lobe there is a 1.3 x 0.7 cm T2 hyperintense and T1 hypointense structure without corresponding enhancement which is favored to represent a benign abnormality. The gallbladder is unremarkable. No bile duct dilatation identified. Pancreas: No mass, inflammatory changes, or other parenchymal abnormality identified. Spleen:  Within normal limits in size and appearance. Adrenals/Urinary Tract:  Normal appearance of the adrenal glands. Again seen are multiple peripherally enhancing right kidney lesions. The dominant lesion arises from the inferior pole of the right kidney measuring 5.1 x 5.4 cm, image 65/6. This is T1 hypointense, T2 isointense with mild peripheral enhancement. Similarly, arising from the medial cortex of the upper pole there is a irregular mass measuring 3.4 x 2.5 cm which is T2 isointense, T1 hypointense, with peripheral enhancement. This lesion also contains a thin internal area of linear enhancement, image 48/16. Also arising from the upper pole of the right kidney is a small peripherally enhancing lesion which is T1 hypointense, teen 2 hyperintense and measures 1.9 x 1.5  cm, image 49/16. Small simple appearing cyst is identified involving the lateral cortex of the right kidney measuring 0.5 cm, image 18/4. 2 distinct lesions are noted arising from the upper pole of left kidney which have similar signal and enhancement characteristics to the suspicious right kidney lesions, image 34/18. The largest of these measures 1.5 cm. Stomach/Bowel: Visualized portions within the abdomen are unremarkable. Vascular/Lymphatic: No signs of renal vein involvement bilaterally. The IVC is patent without evidence for tumor thrombus. Aortic atherosclerosis. No aneurysm. No abdominal adenopathy. Other: No ascites or focal fluid collections identified within the imaged portions of the upper abdomen. Musculoskeletal: No suspicious bone lesions identified. IMPRESSION: 1. Again seen are multiple, bilateral peripherally enhancing kidney lesions. In the setting of a suspicious lung mass, the multiplicity, signal and enhancement characteristics of these lesions favor metastatic disease. Differential considerations include multiple cystic renal cell carcinomas versus multiple liver metastases. 2. Liver lesion adjacent to the gallbladder fossa has signal and enhancement characteristics favoring a metastatic lesion. Electronically Signed   By: TKerby MoorsM.D.   On: 05/14/2020 05:11   CT Abdomen Pelvis W Contrast  Addendum Date: 05/11/2020   ADDENDUM REPORT: 05/11/2020 13:43 ADDENDUM: Of note, an alternative differential consideration for the bilateral renal masses is renal abscesses. The hypoechoic mass in the liver could represent a benign hemangioma, and MR abdomen according could be considered for further evaluation as part of the patient's workup. These results were called by telephone at the time of interpretation on 05/11/2020 at 1:42 pm to provider [facility] [employer] [employer], who verbally acknowledged these results. Electronically Signed   By: TZerita BoersM.D.   On: 05/11/2020 13:43   Result Date:  05/11/2020 CLINICAL DATA:  Lower abdominal pain and generalized weakness for 2 weeks. EXAM: CT ABDOMEN AND PELVIS WITH CONTRAST TECHNIQUE: Multidetector CT imaging of the abdomen and pelvis was performed using the standard protocol following bolus administration of intravenous contrast. CONTRAST:  831mOMNIPAQUE IOHEXOL 300 MG/ML  SOLN COMPARISON:  Report from abdominal radiograph dated 03/05/2002. FINDINGS: Lower chest: No acute abnormality. Hepatobiliary:  There is a 1.6 cm hypoattenuating mass in hepatic segment 5/4B near the gallbladder fossa. No gallstones, gallbladder wall thickening, or biliary dilatation. Pancreas: Unremarkable. No pancreatic ductal dilatation or surrounding inflammatory changes. Spleen: Normal in size without focal abnormality. Adrenals/Urinary Tract: Two left adrenal nodules measure approximately 0.8 and 0.9 cm (series 5, image 49 and image 46). The right adrenal gland appears normal. Multiple indistinct hypoechoic masses enlarged the right kidney with the largest measuring 6.2 cm in diameter. A mass is seen in the right renal pelvis, measuring 2.9 cm (series 7, image 16). At least two hypoechoic masses are seen in the left kidney, measuring up to 1.7 cm. A 7 mm mass in the lateral right kidney likely represents a benign cyst (series 2, image 33). There is no hydronephrosis. Bladder is unremarkable. Stomach/Bowel: Stomach is within normal limits. Appendix appears normal. There is colonic diverticulosis without evidence of diverticulitis. No evidence of bowel wall thickening, distention, or inflammatory changes. Vascular/Lymphatic: Aortic atherosclerosis. The inferior vena cava and both renal veins appear normal. No enlarged abdominal or pelvic lymph nodes. Reproductive: The prostate is mildly enlarged, measuring 5.1 cm in transverse dimension Other: No abdominal wall hernia or abnormality. No abdominopelvic ascites. Musculoskeletal: Degenerative changes are seen in the spine. IMPRESSION:  1. Multiple hypoechoic masses in both kidneys as well as a mass in the right renal pelvis. Differential considerations include renal cell carcinoma, transitional cell carcinoma, renal lymphoma, and metastatic disease. Indeterminate hypoattenuating mass in the liver and two left adrenal nodules may represent metastatic disease. CT abdomen pelvis according to a renal protocol could be considered for further evaluation. Aortic Atherosclerosis (ICD10-I70.0). Electronically Signed: By: Zerita Boers M.D. On: 05/11/2020 12:28   Pelvis Portable  Result Date: 05/18/2020 CLINICAL DATA:  Left total hip arthroplasty EXAM: PORTABLE PELVIS 1-2 VIEWS COMPARISON:  05/17/2020 left hip radiographs FINDINGS: Interval left total hip arthroplasty with no evidence of hardware fracture or loosening. No evidence of hip dislocation on these frontal views. No osseous fracture. No pelvic diastasis. No focal osseous lesions. Expected soft tissue gas surrounding the left hip. IMPRESSION: Satisfactory immediate postoperative frontal view appearance status post left total hip arthroplasty. Electronically Signed   By: Ilona Sorrel M.D.   On: 05/18/2020 15:44   CT FOOT LEFT WO CONTRAST  Result Date: 05/17/2020 CLINICAL DATA:  Ankle and foot injury question of fracture EXAM: CT OF THE LEFT FOOT WITHOUT CONTRAST TECHNIQUE: Multidetector CT imaging of the left foot was performed according to the standard protocol. Multiplanar CT image reconstructions were also generated. COMPARISON:  None. FINDINGS: Bones/Joint/Cartilage No fracture or dislocation. No widening of the Lisfranc joint is seen. There is diffuse osteopenia. Small well corticated ossicle seen at the adjacent to the medial malleolus. Mild tibiotalar joint osteoarthritis seen with joint space loss and subchondral sclerosis. First MTP joint osteoarthritis is with joint space. Ligaments Suboptimally assessed by CT. Muscles and Tendons The muscles surrounding the foot are intact without  focal atrophy or tear. The flexor and extensor tendons intact. The plantar fascia and Achilles tendon. Soft tissues Soft tissue swelling.  No ankle joint effusion is noted. IMPRESSION: No acute fracture or dislocation the ankle or foot. Electronically Signed   By: Prudencio Pair M.D.   On: 05/17/2020 21:24   CT ANKLE LEFT WO CONTRAST  Result Date: 05/17/2020 CLINICAL DATA:  Ankle and foot injury question of fracture EXAM: CT OF THE LEFT FOOT WITHOUT CONTRAST TECHNIQUE: Multidetector CT imaging of the left foot was performed according to the standard  protocol. Multiplanar CT image reconstructions were also generated. COMPARISON:  None. FINDINGS: Bones/Joint/Cartilage No fracture or dislocation. No widening of the Lisfranc joint is seen. There is diffuse osteopenia. Small well corticated ossicle seen at the adjacent to the medial malleolus. Mild tibiotalar joint osteoarthritis seen with joint space loss and subchondral sclerosis. First MTP joint osteoarthritis is with joint space. Ligaments Suboptimally assessed by CT. Muscles and Tendons The muscles surrounding the foot are intact without focal atrophy or tear. The flexor and extensor tendons intact. The plantar fascia and Achilles tendon. Soft tissues Soft tissue swelling.  No ankle joint effusion is noted. IMPRESSION: No acute fracture or dislocation the ankle or foot. Electronically Signed   By: Prudencio Pair M.D.   On: 05/17/2020 21:24   MR FEMUR LEFT WO CONTRAST  Result Date: 05/17/2020 CLINICAL DATA:  Left femoral neck fracture after fall. History of metastatic lung cancer. EXAM: MR OF THE LEFT FEMUR WITHOUT CONTRAST TECHNIQUE: Multiplanar, multisequence MR imaging of the left femur was performed. No intravenous contrast was administered. COMPARISON:  Left hip x-rays from same day. CT abdomen pelvis dated May 12, 2011. FINDINGS: Bones/Joint/Cartilage Acute mildly displaced left femoral neck fracture with apex anterior angulation again noted. No focal  bone lesion. No dislocation. No joint effusion. Muscles and Tendons Mild edema in the bilateral adductor muscles.  No muscle atrophy. Soft tissue No fluid collection or hematoma.  No soft tissue mass. IMPRESSION: 1. Acute mildly displaced left femoral neck fracture again noted. No underlying bone lesion. Electronically Signed   By: Titus Dubin M.D.   On: 05/17/2020 16:12   US BIOPSY (LIVER)  Result Date: 05/15/2020 INDICATION: Right hilar mass, liver lesion adjacent to the gallbladder EXAM: ULTRASOUND GUIDED CORE BIOPSY OF RIGHT LIVER LESION MEDICATIONS: 1% LIDOCAINE LOCAL ANESTHESIA/SEDATION: Fentanyl 50 mcg IV; Versed 1.0 mg IV Moderate Sedation Time:  10 MINUTES The patient was continuously monitored during the procedure by the interventional radiology nurse under my direct supervision. PROCEDURE: The procedure, risks, benefits, and alternatives were explained to the patient. Questions regarding the procedure were encouraged and answered. The patient understands and consents to the procedure. previous imaging reviewed. preliminary ultrasound performed. the solid hypoechoic lesion adjacent to the gallbladder in the right lobe was localized and marked. this is well below the subcostal margin. Under sterile conditions and local anesthesia, a 17 gauge 6.8 cm access was advanced to the lesion under direct ultrasound. Needle position confirmed with ultrasound. 3 18 gauge core biopsies obtained of the lesion under direct ultrasound. Samples were intact and non fragmented. These were placed in formalin. Needle tract occluded with Gel-Foam. Postprocedure imaging demonstrates no hemorrhage or hematoma. Patient tolerated biopsy well. COMPLICATIONS: None immediate. FINDINGS: Imaging confirms needle placed into the right liver lesion adjacent to the gallbladder for core biopsy IMPRESSION: Successful ultrasound right liver lesion 18 gauge core biopsy Electronically Signed   By: Jerilynn Mages.  Shick M.D.   On: 05/15/2020 15:56    US Venous Img Lower Bilateral (DVT)  Result Date: 05/12/2020 CLINICAL DATA:  Bilateral lower extremity pain.  Evaluate for DVT. EXAM: BILATERAL LOWER EXTREMITY VENOUS DOPPLER ULTRASOUND TECHNIQUE: Gray-scale sonography with graded compression, as well as color Doppler and duplex ultrasound were performed to evaluate the lower extremity deep venous systems from the level of the common femoral vein and including the common femoral, femoral, profunda femoral, popliteal and calf veins including the posterior tibial, peroneal and gastrocnemius veins when visible. The superficial great saphenous vein was also interrogated. Spectral Doppler was utilized to  evaluate flow at rest and with distal augmentation maneuvers in the common femoral, femoral and popliteal veins. COMPARISON:  None. FINDINGS: RIGHT LOWER EXTREMITY Common Femoral Vein: No evidence of thrombus. Normal compressibility, respiratory phasicity and response to augmentation. Saphenofemoral Junction: No evidence of thrombus. Normal compressibility and flow on color Doppler imaging. Profunda Femoral Vein: No evidence of thrombus. Normal compressibility and flow on color Doppler imaging. Femoral Vein: No evidence of thrombus. Normal compressibility, respiratory phasicity and response to augmentation. Popliteal Vein: No evidence of thrombus. Normal compressibility, respiratory phasicity and response to augmentation. Calf Veins: No evidence of thrombus. Normal compressibility and flow on color Doppler imaging. Superficial Great Saphenous Vein: No evidence of thrombus. Normal compressibility. Venous Reflux:  None. Other Findings:  None. LEFT LOWER EXTREMITY Common Femoral Vein: No evidence of thrombus. Normal compressibility, respiratory phasicity and response to augmentation. Saphenofemoral Junction: No evidence of thrombus. Normal compressibility and flow on color Doppler imaging. Profunda Femoral Vein: No evidence of thrombus. Normal compressibility and flow  on color Doppler imaging. Femoral Vein: No evidence of thrombus. Normal compressibility, respiratory phasicity and response to augmentation. Popliteal Vein: No evidence of thrombus. Normal compressibility, respiratory phasicity and response to augmentation. Calf Veins: No evidence of thrombus. Normal compressibility and flow on color Doppler imaging. Superficial Great Saphenous Vein: No evidence of thrombus. Normal compressibility. Venous Reflux:  None. Other Findings:  None. IMPRESSION: No evidence of DVT within either lower extremity. Electronically Signed   By: Sandi Mariscal M.D.   On: 05/12/2020 17:38   DG Foot Complete Left  Result Date: 05/17/2020 CLINICAL DATA:  Initial evaluation for acute trauma, fall. EXAM: LEFT FOOT - COMPLETE 3+ VIEW COMPARISON:  None. FINDINGS: No acute fracture or dislocation. Joint spaces maintained. Mild soft tissue swelling about the midfoot and ankle. 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 IMPRESSION: 1. No acute fracture or dislocation. 2. Mild soft tissue swelling about the midfoot and ankle. Electronically Signed   By: Jeannine Boga M.D.   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 IMPRESSION: Intraoperative fluoroscopic guidance for left total hip arthroplasty. Electronically Signed   By: Ilona Sorrel M.D.   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 IMPRESSION: Intraoperative fluoroscopic guidance for left total hip arthroplasty. Electronically Signed   By: Ilona Sorrel M.D.   On: 05/18/2020 15:32   DG Hip Unilat W or Wo Pelvis 2-3 Views Left  Result Date: 05/17/2020 CLINICAL DATA:  Initial evaluation for acute trauma, fall. EXAM: DG HIP (WITH OR WITHOUT PELVIS) 2-3V LEFT COMPARISON:  None. FINDINGS: Acute fracture extends through the left femoral neck with slight superior subluxation. Femoral head remains in normal alignment with the acetabulum. Acetabulum itself appears intact. Bony pelvis intact. Limited views  of the right hip demonstrate no acute finding. Underlying osteopenia. Scattered vascular calcifications noted about the inguinal regions bilaterally. IMPRESSION: Acute mildly displaced fracture of the left femoral neck. Electronically Signed   By: Jeannine Boga M.D.   On: 05/17/2020 04:22     ASSESSMENT:  1.  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 -History of exposure to agent orange while working in [employer]. -CT chest on 05/11/2020 shows 3.2 cm spiculated right upper lobe mass invading the right hilum and right mediastinum with several subcentimeter pulmonary nodules. -CT abdomen showed bilateral adrenal nodules and kidney masses.  Questionable liver lesion with differential including hemangioma. -MRI of the brain on 05/12/2020 did not show any evidence of intracranial metastatic disease. -MRI of the abdomen on 05/13/2020 showing multiple bilateral peripherally enhancing kidney lesions, suspicious for metastatic disease.  Liver lesion adjacent to gallbladder fossa has signal  favoring a metastatic lesion. -Liver biopsy on 05/15/2020 with tumor positive for CK 5/6, p63, p40 and CK7.  Negative for TTF-1 and Napsin a consistent with squamous cell carcinoma. -PD-L1 0%.  2.  Multiple sclerosis: -History of MS with vision loss.  He does not have any acute flares at this time. -He is not on any active therapy.   PLAN:  1.  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 -I have discussed best supportive care in the form of hospice also.  Patient wants active chemotherapy. -Patient currently living at his brother's house and has full family support. -Based on histology I have recommended carboplatin, paclitaxel and pembrolizumab based on keynote-407 trial.  Median overall survival with PD-L1 expression less than 1% was 15.9 months.  Overall response rate was 58%. -Because of his history of MS, there is a rare but potential chance of flare of MS.  However the chance is much higher in people who are on active therapy for MS.  There has been anecdotal evidence of treatment with immunotherapy and MS.  (Clin Transl Oncol. 2019 Oct;21(10):1336-1342. doi: 10.1007/s12094-019-02060-8.) -We will likely use carboplatin and paclitaxel during first cycle without pembrolizumab and see how he does. -We discussed side effects in detail.  We will likely start in 3 weeks.  2.  Left femoral neck fracture: -MRI of the left femur on 05/17/2020 shows acute mildly displaced left femoral neck fracture with no underlying bone lesion. -Left total hip arthroplasty on 05/18/2020. -He also has left wrist fracture from the fall.  Addendum: On further review of literature and consulting colleagues, I am leaning towards just chemotherapy without immunotherapy.  Treatment plan would be carboplatin and paclitaxel for 6 cycles.  Interim scans after 3 cycles to  evaluate response.   Orders placed this encounter:  No orders of the defined types were placed in this encounter.    Derek Jack, MD [place] [phone]   I, Milinda Antis, am acting as a scribe for Dr. Sanda Linger.  I, Derek Jack MD, have reviewed the above documentation for accuracy and completeness, and I agree with the above.

## 2020-05-29 ENCOUNTER — Ambulatory Visit (INDEPENDENT_AMBULATORY_CARE_PROVIDER_SITE_OTHER): Payer: Medicare HMO | Admitting: Nurse Practitioner

## 2020-05-29 ENCOUNTER — Encounter (HOSPITAL_COMMUNITY): Payer: Self-pay | Admitting: Hematology and Oncology

## 2020-05-29 ENCOUNTER — Encounter: Payer: Self-pay | Admitting: Nurse Practitioner

## 2020-05-29 VITALS — BP 145/76 | HR 100 | Temp 97.9°F | Resp 22 | Ht 69.0 in | Wt 125.0 lb

## 2020-05-29 DIAGNOSIS — G35 Multiple sclerosis: Secondary | ICD-10-CM

## 2020-05-29 DIAGNOSIS — H543 Unqualified visual loss, both eyes: Secondary | ICD-10-CM | POA: Diagnosis not present

## 2020-05-29 DIAGNOSIS — C3491 Malignant neoplasm of unspecified part of right bronchus or lung: Secondary | ICD-10-CM

## 2020-05-29 DIAGNOSIS — F411 Generalized anxiety disorder: Secondary | ICD-10-CM

## 2020-05-29 DIAGNOSIS — J439 Emphysema, unspecified: Secondary | ICD-10-CM | POA: Diagnosis not present

## 2020-05-29 DIAGNOSIS — R627 Adult failure to thrive: Secondary | ICD-10-CM

## 2020-05-29 DIAGNOSIS — S7292XA Unspecified fracture of left femur, initial encounter for closed fracture: Secondary | ICD-10-CM

## 2020-05-29 DIAGNOSIS — S62102A Fracture of unspecified carpal bone, left wrist, initial encounter for closed fracture: Secondary | ICD-10-CM

## 2020-05-29 DIAGNOSIS — L89159 Pressure ulcer of sacral region, unspecified stage: Secondary | ICD-10-CM

## 2020-05-29 NOTE — Progress Notes (Signed)
New Patient Office Visit  Subjective:  Patient ID: Brandon Castillo, male    DOB: [birth year]  Age: 69 y.o. MRN: [id number]  CC:  Chief Complaint  Patient presents with  . New Patient (Initial Visit)  . Wrist Pain  . Hip Pain    L HIP REPLACEMENT X 2 WEEKS AGO   . Cancer    STAGE 4, LIVER, LUNG, KIDNEY AND BLADDER    HPI Brandon Castillo presents for new patient visit. He has recent diagnosis of metastatic squamous cell lung cancer with mets to liver and kidneys.  He is followed by Dr. Raliegh Ip at [facility].  He has had port-a-cath placement already. He also recently had a left hip replacement (with Dr. Lyla Glassing) after having a fracture d/t fall. He has a hx of MS and has been living with his brother for some time. He states he developed MS after agent orange exposure after [employer] service in [place].  He has not had a recent PCP or neurologist to help manage his MS.  Past Medical History:  Diagnosis Date  . Cancer [facility])    Diagnosed yesterday  . Multiple sclerosis ([place])     Past Surgical History:  Procedure Laterality Date  . IR IMAGING GUIDED PORT INSERTION  05/20/2020   right  . TOTAL HIP ARTHROPLASTY Left 05/18/2020   Procedure: TOTAL HIP ARTHROPLASTY ANTERIOR APPROACH;  Surgeon: Rod Can, MD;  Location: [employer];  Service: Orthopedics;  Laterality: Left;    No family history on file.  Social History   Socioeconomic History  . Marital status: Single    Spouse name: Not on file  . Number of children: Not on file  . Years of education: Not on file  . Highest education level: Not on file  Occupational History  . Not on file  Tobacco Use  . Smoking status: Never Smoker  . Smokeless tobacco: Never Used  Vaping Use  . Vaping Use: Never used  Substance and Sexual Activity  . Alcohol use: Not Currently  . Drug use: Not Currently  . Sexual activity: Not on file  Other Topics Concern  . Not on file  Social History Narrative  . Not on file   Social Determinants of  Health   Financial Resource Strain:   . Difficulty of Paying Living Expenses: Not on file  Food Insecurity:   . Worried About Charity fundraiser in the Last Year: Not on file  . Ran Out of Food in the Last Year: Not on file  Transportation Needs:   . Lack of Transportation (Medical): Not on file  . Lack of Transportation (Non-Medical): Not on file  Physical Activity:   . Days of Exercise per Week: Not on file  . Minutes of Exercise per Session: Not on file  Stress:   . Feeling of Stress : Not on file  Social Connections:   . Frequency of Communication with Friends and Family: Not on file  . Frequency of Social Gatherings with Friends and Family: Not on file  . Attends Religious Services: Not on file  . Active Member of Clubs or Organizations: Not on file  . Attends Archivist Meetings: Not on file  . Marital Status: Not on file  Intimate Partner Violence:   . Fear of Current or Ex-Partner: Not on file  . Emotionally Abused: Not on file  . Physically Abused: Not on file  . Sexually Abused: Not on file    ROS Review of Systems  Constitutional: Positive for unexpected weight change.       Recent weight loss  Respiratory: Positive for cough and shortness of breath.   Cardiovascular: Negative.   Musculoskeletal: Positive for gait problem.       Uses w/c for ambulation; has hx of MS and has been disabled for over 40 years  Psychiatric/Behavioral: Negative for sleep disturbance.       Uses ativan to help with sleep    Objective:   Today's Vitals: BP (!) 145/76   Pulse 100   Temp 97.9 F (36.6 C)   Resp (!) 22   Ht 5\' 9"  (1.753 m)   Wt 125 lb (56.7 kg)   SpO2 95%   BMI 18.46 kg/m   Physical Exam Vitals reviewed: frail-appearing.  Constitutional:      Appearance: He is ill-appearing.  Cardiovascular:     Rate and Rhythm: Normal rate and regular rhythm.     Pulses: Normal pulses.     Heart sounds: Normal heart sounds.  Pulmonary:     Effort: No  respiratory distress.     Breath sounds: No wheezing, rhonchi or rales.     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 Lyla Glassing -he is taking narcotics prescribed by ortho  Stage IV lung CA -followed by Dr. Delton Coombes -has next appt in 3 weeks -he is agreeable to start chemo, but is waiting for fractures to heal -we discussed that if he feels his breathing is worse, we will meet as needed  Failure to Thrive -he is eating ensure shakes and Haagen-Dazs ice cream for nutritional supplementation -increase protein intake  MS/blindness -he has been deiabled for approx 42 years -he lives with his brother now who helps take care of him   Pressure Ulcer -he has had wound care since his hip surgery -his brother is providing daily wound care, and this has been healing     Outpatient Encounter Medications as of 05/29/2020  Medication Sig  . apixaban (ELIQUIS) 2.5 MG TABS tablet Take 1 tablet (2.5 mg total) by mouth 2 (two) times daily.  . feeding supplement (ENSURE ENLIVE / ENSURE PLUS) LIQD Take 237 mLs by mouth 3 (three) times daily.  . ferrous sulfate 325 (65 FE) MG tablet Take 1 tablet (325 mg total) by mouth 2 (two) times daily with a  meal.  . LORazepam (ATIVAN) 1 MG tablet Take 1 tablet (1 mg total) by mouth every 8 (eight) hours as needed for anxiety.  . methocarbamol (ROBAXIN) 500 MG tablet Take 1 tablet (500 mg total) by mouth 4 (four) times daily.  . multivitamin-lutein (OCUVITE-LUTEIN) CAPS capsule Take 1 capsule by mouth daily.  . ondansetron (  ZOFRAN) 4 MG tablet Take 1 tablet (4 mg total) by mouth every 6 (six) hours as needed for nausea.  Marland Kitchen oxyCODONE 10 MG TABS Take 1 tablet (10 mg total) by mouth every 4 (four) hours as needed for moderate pain.  . pantoprazole (PROTONIX) 40 MG tablet Take 1 tablet (40 mg total) by mouth daily.  . polyethylene glycol (MIRALAX / GLYCOLAX) 17 g packet Take 17 g by mouth daily.  Marland Kitchen senna-docusate (SENOKOT-S) 8.6-50 MG tablet Take 2 tablets by mouth at bedtime.  . traZODone (DESYREL) 100 MG tablet Take 0.5-1 tablets (50-100 mg total) by mouth at bedtime.   No facility-administered encounter medications on file as of 05/29/2020.      Follow-up: Return in about 1 month (around 06/28/2020), or if symptoms worsen or fail to improve, for physical exam (no labs required); may return sooner if needed if any complications arise.  We discussed that he had recent labs before surgery and we don't need to draw cholesterol panel since he has other medical issue that are taking priority.  We may discuss goals of care/advanced care planning after his next oncology appt.  [name], NP

## 2020-05-29 NOTE — Patient Instructions (Signed)
Recent fractures (left wrist and left hip) -he is followed by Dr. Lyla Glassing -he is taking narcotics prescribed by ortho  Stage IV lung CA -followed by Dr. Delton Coombes -has next appt in 3 weeks -he is agreeable to start chemo, but is waiting for fractures to heal -we discussed that if he feels his breathing is worse, we will meet as needed  Failure to Thrive -he is eating ensure shakes and Haagen-Dazs ice cream for nutritional supplementation -increase protein intake  MS/blindness -he has been deiabled for approx 42 years -he lives with his brother now who helps take care of him   Pressure Ulcer -he has had wound care since his hip surgery -his brother is providing daily wound care, and this has been healing

## 2020-05-30 ENCOUNTER — Encounter (HOSPITAL_COMMUNITY): Payer: Self-pay | Admitting: Orthopaedic Surgery

## 2020-05-30 NOTE — Anesthesia Preprocedure Evaluation (Addendum)
Anesthesia Evaluation  Patient identified by MRN, date of birth, ID band Patient awake    Reviewed: Allergy & Precautions, NPO status , Patient's Chart, lab work & pertinent test results  Airway Mallampati: II  TM Distance: >3 FB Neck ROM: Full    Dental no notable dental hx. (+) Dental Advisory Given   Pulmonary COPD,  Lung Ca with mets   Pulmonary exam normal        Cardiovascular negative cardio ROS Normal cardiovascular exam     Neuro/Psych PSYCHIATRIC DISORDERS Anxiety MS causing blindness negative neurological ROS     GI/Hepatic negative GI ROS, Neg liver ROS,   Endo/Other  negative endocrine ROS  Renal/GU negative Renal ROS  negative genitourinary   Musculoskeletal negative musculoskeletal ROS (+)   Abdominal   Peds negative pediatric ROS (+)  Hematology negative hematology ROS (+)   Anesthesia Other Findings   Reproductive/Obstetrics negative OB ROS                                                             Anesthesia Evaluation  Patient identified by MRN, date of birth, ID band Patient awake    Reviewed: Allergy & Precautions, NPO status , Patient's Chart, lab work & pertinent test results  History of Anesthesia Complications Negative for: history of anesthetic complications  Airway Mallampati: II  TM Distance: >3 FB Neck ROM: Full    Dental  (+) Poor Dentition, Chipped, Missing, Dental Advisory Given   Pulmonary COPD, former smoker,  05/17/2020 SARS coronavirus NEG RUL mass: metastatic R lung cancer   breath sounds clear to auscultation       Cardiovascular (-) anginanegative cardio ROS   Rhythm:Regular Rate:Normal     Neuro/Psych Anxiety MS    GI/Hepatic Neg liver ROS, GERD  Medicated and Controlled,  Endo/Other  negative endocrine ROS  Renal/GU negative Renal ROS     Musculoskeletal   Abdominal   Peds  Hematology negative  hematology ROS (+)   Anesthesia Other Findings   Reproductive/Obstetrics                           Anesthesia Physical Anesthesia Plan  ASA: III  Anesthesia Plan: General   Post-op Pain Management:    Induction: Intravenous  PONV Risk Score and Plan: 2 and Ondansetron and Dexamethasone  Airway Management Planned: Oral ETT  Additional Equipment: None  Intra-op Plan:   Post-operative Plan: Extubation in OR  Informed Consent: I have reviewed the patients History and Physical, chart, labs and discussed the procedure including the risks, benefits and alternatives for the proposed anesthesia with the patient or authorized representative who has indicated his/her understanding and acceptance.   Patient has DNR.  Discussed DNR with patient and Suspend DNR.   Dental advisory given  Plan Discussed with: CRNA and Surgeon  Anesthesia Plan Comments:        Anesthesia Quick Evaluation  Anesthesia Physical Anesthesia Plan  ASA: III  Anesthesia Plan: MAC and Regional   Post-op Pain Management:    Induction:   PONV Risk Score and Plan: 2 and Ondansetron and Propofol infusion  Airway Management Planned: Natural Airway  Additional Equipment:   Intra-op Plan:   Post-operative Plan:   Informed Consent: I have reviewed the patients  History and Physical, chart, labs and discussed the procedure including the risks, benefits and alternatives for the proposed anesthesia with the patient or authorized representative who has indicated his/her understanding and acceptance.     Dental advisory given  Plan Discussed with: Anesthesiologist and CRNA  Anesthesia Plan Comments:        Anesthesia Quick Evaluation

## 2020-05-30 NOTE — Progress Notes (Signed)
Hx given by brother Abran Cantor and pt  PCP - Donneta Romberg, MD Cardiologist - denies  Chest x-ray - 05/17/20 EKG - 05/19/20 Stress Test - denies ECHO - denies Cardiac Cath - denies   Blood Thinner Instructions: Follow your surgeon's instructions on when to stop apixaban [facility])  If no instructions were given by your surgeon then you will need to call the office to get those instructions.    Aspirin Instructions: n/a  ERAS Protcol - yes clears until [place]  Anesthesia review: yes -- hgb 8.1 on 11/9   -------------  SDW INSTRUCTIONS:  Your procedure is scheduled on 06/02/20.  Report to [facility] Main Entrance "A" at 1:30 P.M., and check in at the Admitting office.  Call this number if you have problems the morning of surgery: [phone]   Remember: Do not eat after midnight the night before your surgery  You may drink clear liquids until 1:30 P.M. the morning of your surgery.   Clear liquids allowed are: Water, Non-Citrus Juices (without pulp), Carbonated Beverages, Clear Tea, Black Coffee Only, and Gatorade   Take these medicines the morning of surgery with A SIP OF WATER: pantoprazole (PROTONIX)   If needed: HYDROcodone-acetaminophen (NORCO) LORazepam (ATIVAN)  methocarbamol (ROBAXIN)  ondansetron (ZOFRAN)   Follow your surgeon's instructions on when to stop apixaban (ELIQUIS).  If no instructions were given by your surgeon then you will need to call the office to get those instructions.    As of today, STOP taking any Aleve, Naproxen, Ibuprofen, Motrin, Advil, Goody's, BC's, all herbal medications, fish oil, and all vitamins.    The Morning of Surgery  Do not wear jewelry  Do not wear lotions, powders, colognes, or deodorant Men may shave face and neck.  Do not bring valuables to the hospital.  [facility] is not responsible for any belongings or valuables.  If you are a smoker, DO NOT Smoke 24 hours prior to surgery  If you wear a  CPAP at night please bring your mask the morning of surgery   Remember that you must have someone to transport you home after your surgery, and remain with you for 24 hours if you are discharged the same day.  Please bring cases for contacts, glasses, hearing aids, dentures or bridgework because it cannot be worn into surgery.   Leave your suitcase in the car.  After surgery it may be brought to your room.  For patients admitted to the hospital, discharge time will be determined by your treatment team.  Patients discharged the day of surgery will not be allowed to drive home.    Special instructions:   [facility]- Preparing For Surgery  Oral Hygiene is also important to reduce your risk of infection.  Remember - BRUSH YOUR TEETH THE MORNING OF SURGERY WITH YOUR REGULAR TOOTHPASTE  Please follow these instructions carefully.   1. Shower the NIGHT BEFORE SURGERY and the MORNING OF SURGERY with DIAL Soap.   2. Wash thoroughly, paying special attention to the area where your surgery will be performed.  3. Thoroughly rinse your body with warm water from the neck down.  4. Pat yourself dry with a CLEAN TOWEL.  5. Wear CLEAN PAJAMAS to bed the night before surgery  6. Place CLEAN SHEETS on your bed the night of your first shower and DO NOT SLEEP WITH PETS.  7. Wear comfortable clothes the morning of surgery.    Day of Surgery:  Please shower the morning  of surgery with the DIAL soap Do not apply any deodorants/lotions. Please wear clean clothes to the hospital/surgery center.   Remember to brush your teeth WITH YOUR REGULAR TOOTHPASTE.   Please read over the following fact sheets that you were given.  Patient denies shortness of breath, fever, cough and chest pain.

## 2020-06-02 ENCOUNTER — Ambulatory Visit (HOSPITAL_COMMUNITY): Payer: Medicare HMO | Admitting: Anesthesiology

## 2020-06-02 ENCOUNTER — Other Ambulatory Visit: Payer: Self-pay

## 2020-06-02 ENCOUNTER — Ambulatory Visit (HOSPITAL_COMMUNITY)
Admission: RE | Admit: 2020-06-02 | Discharge: 2020-06-02 | Disposition: A | Discharge status: Home / Self Care | Payer: Medicare HMO | Source: Home / Self Care | Attending: Orthopaedic Surgery | Admitting: Orthopaedic Surgery

## 2020-06-02 ENCOUNTER — Ambulatory Visit (HOSPITAL_COMMUNITY): Payer: Medicare HMO

## 2020-06-02 ENCOUNTER — Encounter (HOSPITAL_COMMUNITY): Payer: Self-pay | Admitting: Orthopaedic Surgery

## 2020-06-02 ENCOUNTER — Encounter (HOSPITAL_COMMUNITY)
Admission: RE | Disposition: A | Discharge status: Home / Self Care | Payer: Self-pay | Source: Home / Self Care | Attending: Orthopaedic Surgery

## 2020-06-02 DIAGNOSIS — C787 Secondary malignant neoplasm of liver and intrahepatic bile duct: Secondary | ICD-10-CM | POA: Diagnosis present

## 2020-06-02 DIAGNOSIS — Z885 Allergy status to narcotic agent status: Secondary | ICD-10-CM

## 2020-06-02 DIAGNOSIS — W109XXA Fall (on) (from) unspecified stairs and steps, initial encounter: Secondary | ICD-10-CM | POA: Diagnosis present

## 2020-06-02 DIAGNOSIS — Z681 Body mass index (BMI) 19 or less, adult: Secondary | ICD-10-CM

## 2020-06-02 DIAGNOSIS — Y92009 Unspecified place in unspecified non-institutional (private) residence as the place of occurrence of the external cause: Secondary | ICD-10-CM

## 2020-06-02 DIAGNOSIS — F513 Sleepwalking [somnambulism]: Secondary | ICD-10-CM | POA: Diagnosis present

## 2020-06-02 DIAGNOSIS — C7801 Secondary malignant neoplasm of right lung: Secondary | ICD-10-CM | POA: Diagnosis present

## 2020-06-02 DIAGNOSIS — Z96642 Presence of left artificial hip joint: Secondary | ICD-10-CM | POA: Diagnosis present

## 2020-06-02 DIAGNOSIS — G35 Multiple sclerosis: Secondary | ICD-10-CM | POA: Diagnosis present

## 2020-06-02 DIAGNOSIS — A09 Infectious gastroenteritis and colitis, unspecified: Secondary | ICD-10-CM | POA: Diagnosis present

## 2020-06-02 DIAGNOSIS — A419 Sepsis, unspecified organism: Secondary | ICD-10-CM | POA: Diagnosis not present

## 2020-06-02 DIAGNOSIS — S52572A Other intraarticular fracture of lower end of left radius, initial encounter for closed fracture: Secondary | ICD-10-CM | POA: Insufficient documentation

## 2020-06-02 DIAGNOSIS — K2970 Gastritis, unspecified, without bleeding: Secondary | ICD-10-CM | POA: Diagnosis present

## 2020-06-02 DIAGNOSIS — K5641 Fecal impaction: Secondary | ICD-10-CM | POA: Diagnosis present

## 2020-06-02 DIAGNOSIS — C7901 Secondary malignant neoplasm of right kidney and renal pelvis: Secondary | ICD-10-CM | POA: Diagnosis present

## 2020-06-02 DIAGNOSIS — E872 Acidosis: Secondary | ICD-10-CM | POA: Diagnosis present

## 2020-06-02 DIAGNOSIS — J439 Emphysema, unspecified: Secondary | ICD-10-CM | POA: Diagnosis present

## 2020-06-02 DIAGNOSIS — Z7401 Bed confinement status: Secondary | ICD-10-CM

## 2020-06-02 DIAGNOSIS — R627 Adult failure to thrive: Secondary | ICD-10-CM | POA: Diagnosis present

## 2020-06-02 DIAGNOSIS — Z20822 Contact with and (suspected) exposure to covid-19: Secondary | ICD-10-CM | POA: Insufficient documentation

## 2020-06-02 DIAGNOSIS — Z66 Do not resuscitate: Secondary | ICD-10-CM | POA: Diagnosis present

## 2020-06-02 DIAGNOSIS — Z7901 Long term (current) use of anticoagulants: Secondary | ICD-10-CM

## 2020-06-02 DIAGNOSIS — R7989 Other specified abnormal findings of blood chemistry: Secondary | ICD-10-CM | POA: Diagnosis present

## 2020-06-02 DIAGNOSIS — G4700 Insomnia, unspecified: Secondary | ICD-10-CM | POA: Diagnosis present

## 2020-06-02 DIAGNOSIS — H543 Unqualified visual loss, both eyes: Secondary | ICD-10-CM | POA: Diagnosis present

## 2020-06-02 DIAGNOSIS — D75839 Thrombocytosis, unspecified: Secondary | ICD-10-CM | POA: Diagnosis present

## 2020-06-02 DIAGNOSIS — Z79899 Other long term (current) drug therapy: Secondary | ICD-10-CM | POA: Insufficient documentation

## 2020-06-02 DIAGNOSIS — N029 Recurrent and persistent hematuria with unspecified morphologic changes: Secondary | ICD-10-CM | POA: Diagnosis not present

## 2020-06-02 DIAGNOSIS — F411 Generalized anxiety disorder: Secondary | ICD-10-CM | POA: Diagnosis present

## 2020-06-02 DIAGNOSIS — C3491 Malignant neoplasm of unspecified part of right bronchus or lung: Secondary | ICD-10-CM | POA: Diagnosis present

## 2020-06-02 DIAGNOSIS — W19XXXA Unspecified fall, initial encounter: Secondary | ICD-10-CM | POA: Insufficient documentation

## 2020-06-02 DIAGNOSIS — E43 Unspecified severe protein-calorie malnutrition: Secondary | ICD-10-CM | POA: Diagnosis present

## 2020-06-02 DIAGNOSIS — M25532 Pain in left wrist: Secondary | ICD-10-CM | POA: Diagnosis not present

## 2020-06-02 DIAGNOSIS — Z87891 Personal history of nicotine dependence: Secondary | ICD-10-CM

## 2020-06-02 DIAGNOSIS — N136 Pyonephrosis: Secondary | ICD-10-CM | POA: Diagnosis not present

## 2020-06-02 DIAGNOSIS — E278 Other specified disorders of adrenal gland: Secondary | ICD-10-CM | POA: Diagnosis present

## 2020-06-02 DIAGNOSIS — N319 Neuromuscular dysfunction of bladder, unspecified: Secondary | ICD-10-CM | POA: Diagnosis present

## 2020-06-02 DIAGNOSIS — D539 Nutritional anemia, unspecified: Secondary | ICD-10-CM | POA: Diagnosis present

## 2020-06-02 DIAGNOSIS — E860 Dehydration: Secondary | ICD-10-CM | POA: Diagnosis present

## 2020-06-02 DIAGNOSIS — Z515 Encounter for palliative care: Secondary | ICD-10-CM

## 2020-06-02 HISTORY — PX: ORIF WRIST FRACTURE: SHX2133

## 2020-06-02 LAB — CBC
HCT: 32.6 % — ABNORMAL LOW (ref 39.0–52.0)
Hemoglobin: 10 g/dL — ABNORMAL LOW (ref 13.0–17.0)
MCH: 31.8 pg (ref 26.0–34.0)
MCHC: 30.7 g/dL (ref 30.0–36.0)
MCV: 103.8 fL — ABNORMAL HIGH (ref 80.0–100.0)
Platelets: 406 10*3/uL — ABNORMAL HIGH (ref 150–400)
RBC: 3.14 MIL/uL — ABNORMAL LOW (ref 4.22–5.81)
RDW: 13.2 % (ref 11.5–15.5)
WBC: 13.4 10*3/uL — ABNORMAL HIGH (ref 4.0–10.5)
nRBC: 0 % (ref 0.0–0.2)

## 2020-06-02 LAB — SARS CORONAVIRUS 2 BY RT PCR (HOSPITAL ORDER, PERFORMED IN ~~LOC~~ HOSPITAL LAB): SARS Coronavirus 2: NEGATIVE

## 2020-06-02 SURGERY — OPEN REDUCTION INTERNAL FIXATION (ORIF) WRIST FRACTURE
Anesthesia: Monitor Anesthesia Care | Site: Wrist | Laterality: Left

## 2020-06-02 MED ORDER — LACTATED RINGERS IV SOLN: INTRAVENOUS | Status: DC

## 2020-06-02 MED ORDER — BUPIVACAINE-EPINEPHRINE (PF) 0.5% -1:200000 IJ SOLN
INTRAMUSCULAR | Status: DC | PRN
  Administered 2020-06-02: 30 mL

## 2020-06-02 MED ORDER — PROPOFOL 500 MG/50ML IV EMUL
INTRAVENOUS | Status: DC | PRN
  Administered 2020-06-02: 50 ug/kg/min via INTRAVENOUS

## 2020-06-02 MED ORDER — PROPOFOL 10 MG/ML IV BOLUS
INTRAVENOUS | Status: AC
  Filled 2020-06-02: qty 20

## 2020-06-02 MED ORDER — ACETAMINOPHEN 500 MG PO TABS
1000.0000 mg | ORAL_TABLET | Freq: Once | ORAL | Status: AC
  Administered 2020-06-02: 1000 mg via ORAL
  Filled 2020-06-02: qty 2

## 2020-06-02 MED ORDER — CEFAZOLIN SODIUM-DEXTROSE 2-4 GM/100ML-% IV SOLN
2.0000 g | INTRAVENOUS | Status: AC
  Administered 2020-06-02: 2 g via INTRAVENOUS
  Filled 2020-06-02: qty 100

## 2020-06-02 MED ORDER — MIDAZOLAM HCL 2 MG/2ML IJ SOLN
INTRAMUSCULAR | Status: AC
  Administered 2020-06-02: 2 mg via INTRAVENOUS
  Filled 2020-06-02: qty 2

## 2020-06-02 MED ORDER — LIDOCAINE HCL (PF) 2 % IJ SOLN
INTRAMUSCULAR | Status: AC
  Filled 2020-06-02: qty 5

## 2020-06-02 MED ORDER — FENTANYL CITRATE (PF) 250 MCG/5ML IJ SOLN
INTRAMUSCULAR | Status: AC
  Filled 2020-06-02: qty 5

## 2020-06-02 MED ORDER — DEXAMETHASONE SODIUM PHOSPHATE 10 MG/ML IJ SOLN
INTRAMUSCULAR | Status: DC | PRN
  Administered 2020-06-02: 10 mg

## 2020-06-02 MED ORDER — POVIDONE-IODINE 10 % EX SWAB
2.0000 "application " | Freq: Once | CUTANEOUS | Status: AC
  Administered 2020-06-02: 2 via TOPICAL

## 2020-06-02 MED ORDER — CHLORHEXIDINE GLUCONATE 4 % EX LIQD: 60.0000 mL | Freq: Once | CUTANEOUS | Status: DC

## 2020-06-02 MED ORDER — MIDAZOLAM HCL 2 MG/2ML IJ SOLN: 2.0000 mg | Freq: Once | INTRAMUSCULAR | Status: AC

## 2020-06-02 MED ORDER — ONDANSETRON HCL 4 MG/2ML IJ SOLN
INTRAMUSCULAR | Status: AC
  Filled 2020-06-02: qty 2

## 2020-06-02 MED ORDER — FENTANYL CITRATE (PF) 100 MCG/2ML IJ SOLN
INTRAMUSCULAR | Status: AC
  Administered 2020-06-02: 100 ug via INTRAVENOUS
  Filled 2020-06-02: qty 2

## 2020-06-02 MED ORDER — PROPOFOL 500 MG/50ML IV EMUL: INTRAVENOUS | Status: DC | PRN

## 2020-06-02 MED ORDER — CELECOXIB 200 MG PO CAPS
200.0000 mg | ORAL_CAPSULE | Freq: Once | ORAL | Status: AC
  Administered 2020-06-02: 200 mg via ORAL
  Filled 2020-06-02: qty 1

## 2020-06-02 MED ORDER — PROPOFOL 10 MG/ML IV BOLUS
INTRAVENOUS | Status: DC | PRN
  Administered 2020-06-02: 10 mg via INTRAVENOUS
  Administered 2020-06-02: 30 mg via INTRAVENOUS

## 2020-06-02 MED ORDER — ORAL CARE MOUTH RINSE: 15.0000 mL | Freq: Once | OROMUCOSAL | Status: AC

## 2020-06-02 MED ORDER — MIDAZOLAM HCL 2 MG/2ML IJ SOLN
INTRAMUSCULAR | Status: DC | PRN
  Administered 2020-06-02: 1 mg via INTRAVENOUS

## 2020-06-02 MED ORDER — CHLORHEXIDINE GLUCONATE 0.12 % MT SOLN
15.0000 mL | Freq: Once | OROMUCOSAL | Status: AC
  Administered 2020-06-02: 15 mL via OROMUCOSAL
  Filled 2020-06-02: qty 15

## 2020-06-02 MED ORDER — FENTANYL CITRATE (PF) 100 MCG/2ML IJ SOLN: 100.0000 ug | Freq: Once | INTRAMUSCULAR | Status: AC

## 2020-06-02 MED ORDER — HYDROCODONE-ACETAMINOPHEN 10-325 MG PO TABS: 1.0000 | ORAL_TABLET | Freq: Four times a day (QID) | ORAL | 0 refills | Status: DC | PRN

## 2020-06-02 MED ORDER — MIDAZOLAM HCL 2 MG/2ML IJ SOLN
INTRAMUSCULAR | Status: AC
  Filled 2020-06-02: qty 2

## 2020-06-02 MED ORDER — DEXAMETHASONE SODIUM PHOSPHATE 10 MG/ML IJ SOLN
INTRAMUSCULAR | Status: AC
  Filled 2020-06-02: qty 1

## 2020-06-02 MED ORDER — PROPOFOL 1000 MG/100ML IV EMUL
INTRAVENOUS | Status: AC
  Filled 2020-06-02: qty 100

## 2020-06-02 SURGICAL SUPPLY — 57 items
ALCOHOL 70% 16 OZ (MISCELLANEOUS) ×6 IMPLANT
APL PRP STRL LF DISP 70% ISPRP (MISCELLANEOUS) ×1
BIT DRILL 2.0 LNG QUCK RELEASE (BIT) IMPLANT
BIT DRILL QC 2.8X5 (BIT) ×2 IMPLANT
BNDG CMPR 9X4 STRL LF SNTH (GAUZE/BANDAGES/DRESSINGS) ×1
BNDG ELASTIC 3X5.8 VLCR STR LF (GAUZE/BANDAGES/DRESSINGS) ×3 IMPLANT
BNDG ELASTIC 4X5.8 VLCR STR LF (GAUZE/BANDAGES/DRESSINGS) ×3 IMPLANT
BNDG ESMARK 4X9 LF (GAUZE/BANDAGES/DRESSINGS) ×3 IMPLANT
BNDG GAUZE ELAST 4 BULKY (GAUZE/BANDAGES/DRESSINGS) ×3 IMPLANT
CANISTER SUCT 3000ML PPV (MISCELLANEOUS) ×3 IMPLANT
CHLORAPREP W/TINT 26 (MISCELLANEOUS) ×3 IMPLANT
CORD BIPOLAR FORCEPS 12FT (ELECTRODE) ×3 IMPLANT
COVER SURGICAL LIGHT HANDLE (MISCELLANEOUS) ×3 IMPLANT
COVER WAND RF STERILE (DRAPES) IMPLANT
CUFF TOURN SGL QUICK 18X4 (TOURNIQUET CUFF) ×3 IMPLANT
CUFF TOURN SGL QUICK 24 (TOURNIQUET CUFF)
CUFF TRNQT CYL 24X4X16.5-23 (TOURNIQUET CUFF) IMPLANT
DRAPE OEC MINIVIEW 54X84 (DRAPES) ×3 IMPLANT
DRAPE SURG 17X23 STRL (DRAPES) ×3 IMPLANT
DRILL 2.0 LNG QUICK RELEASE (BIT) ×3
GAUZE SPONGE 4X4 12PLY STRL (GAUZE/BANDAGES/DRESSINGS) ×3 IMPLANT
GAUZE XEROFORM 1X8 LF (GAUZE/BANDAGES/DRESSINGS) ×2 IMPLANT
GLOVE INDICATOR 8.0 STRL GRN (GLOVE) ×3 IMPLANT
GLOVE SURG SYN 7.5  E (GLOVE) ×3
GLOVE SURG SYN 7.5 E (GLOVE) ×1 IMPLANT
GLOVE SURG SYN 7.5 PF PI (GLOVE) ×1 IMPLANT
GOWN STRL REUS W/ TWL LRG LVL3 (GOWN DISPOSABLE) ×2 IMPLANT
GOWN STRL REUS W/TWL LRG LVL3 (GOWN DISPOSABLE) ×6
GUIDEWIRE ORTHO 0.054X6 (WIRE) ×6 IMPLANT
KIT BASIN OR (CUSTOM PROCEDURE TRAY) ×3 IMPLANT
KIT TURNOVER KIT B (KITS) ×3 IMPLANT
NEEDLE 22X1 1/2 (OR ONLY) (NEEDLE) IMPLANT
NS IRRIG 1000ML POUR BTL (IV SOLUTION) ×3 IMPLANT
PACK ORTHO EXTREMITY (CUSTOM PROCEDURE TRAY) ×3 IMPLANT
PAD ARMBOARD 7.5X6 YLW CONV (MISCELLANEOUS) ×6 IMPLANT
PAD CAST 3X4 CTTN HI CHSV (CAST SUPPLIES) ×1 IMPLANT
PAD CAST 4YDX4 CTTN HI CHSV (CAST SUPPLIES) ×1 IMPLANT
PADDING CAST COTTON 3X4 STRL (CAST SUPPLIES) ×3
PADDING CAST COTTON 4X4 STRL (CAST SUPPLIES) ×3
PLATE DISTAL RADIUS L WIDE (Plate) ×2 IMPLANT
SCREW BN FT 16X2.3XLCK HEX CRT (Screw) IMPLANT
SCREW CORT FT 18X2.3XLCK HEX (Screw) IMPLANT
SCREW CORTICAL LOCKING 2.3X14M (Screw) ×2 IMPLANT
SCREW CORTICAL LOCKING 2.3X16M (Screw) ×3 IMPLANT
SCREW CORTICAL LOCKING 2.3X18M (Screw) ×15 IMPLANT
SCREW HEXALOBE LOCKING 3.5X14M (Screw) ×4 IMPLANT
SCREW HEXALOBE LOCKING 3.5X16M (Screw) ×2 IMPLANT
SCREW HEXALOBE NON-LOCK 3.5X14 (Screw) ×2 IMPLANT
SUT PROLENE 4 0 PS 2 18 (SUTURE) ×4 IMPLANT
SUT VIC AB 3-0 PS2 18 (SUTURE) IMPLANT
SYR CONTROL 10ML LL (SYRINGE) IMPLANT
TOWEL GREEN STERILE (TOWEL DISPOSABLE) ×3 IMPLANT
TOWEL GREEN STERILE FF (TOWEL DISPOSABLE) ×3 IMPLANT
TUBE CONNECTING 12'X1/4 (SUCTIONS) ×1
TUBE CONNECTING 12X1/4 (SUCTIONS) ×2 IMPLANT
UNDERPAD 30X36 HEAVY ABSORB (UNDERPADS AND DIAPERS) ×3 IMPLANT
WATER STERILE IRR 1000ML POUR (IV SOLUTION) ×3 IMPLANT

## 2020-06-02 NOTE — Discharge Instructions (Signed)
Discharge Instructions  - Keep dressings in place. Do not remove them. - The dressings must stay dry - Take all medication as prescribed. Transition to over the counter pain medication as your pain improves - Keep the hand elevated over the next 48-72 hours to help with pain and swelling - Move all digits not restricted by the dressings regularly to prevent stiffness - Please call to schedule a follow up appointment with Dr. Jeannie Fend and therapy at [phone] for 10-14 days following surgery - Your pain medication have been sent digitally to your pharmacy

## 2020-06-02 NOTE — Op Note (Signed)
PREOPERATIVE DIAGNOSIS: Displaced left intra-articular distal radius fracture  POSTOPERATIVE DIAGNOSIS: Same  ATTENDING PHYSICIAN: Maudry Mayhew. Jeannie Fend, III, MD who was present and scrubbed for the entire case   ASSISTANT SURGEON: None.   ANESTHESIA: Regional with MAC  SURGICAL PROCEDURES: Open reduction and internal fixation of left three-part intra-articular distal radius fracture  SURGICAL INDICATIONS: Patient is a 69 year old male who was seen evaluated by me in clinic. 2 weeks ago he had a fall in which he sustained a left femoral neck fracture as well as a left displaced distal radius fracture. He had significant dorsal angulation and displacement fracture so he did wish to proceed for surgical fixation of his wrist and presents today for that.  FINDING: Near-anatomic alignment was achieved with stable fixation with volar locking plate and screw construct of the three-part intra-articular left distal radius fracture.  DESCRIPTION OF PROCEDURE:   Patient was identified in the preop holding area where the risk benefits and alternatives were discussed with the patient once again.  These risks include but are not limited to infection, bleeding, damage to surrounding structures including blood vessels and nerves, pain, stiffness, malunion, nonunion, implant failure need for additional procedures.  Informed consent was obtained at that time the patient's left wrist was marked with a surgical marking pen.  The patient then underwent a left upper extremity plexus block by anesthesia.  He was brought to the operative suite where time was performed identifying the correct patient operative site.  He was positioned supine on the operative table with his hand outstretched on a hand table.  He was induced under MAC sedation.  A tourniquet was placed on the upper arm in the left upper extremity was then prepped and draped in usual sterile fashion.  The limb was exsanguinated and the tourniquet was  inflated.  A longitudinal, standard FCR approach was used for the volar wrist.  Incision was made over top of the FCR tendon and sharp dissection was carried down through subcutaneous tissues.  The FCR sheath was incised and the tendon was mobilized ulnarly.  The deep FCR sheath was then also incised and the FPL muscle belly and tendon were identified.  These were bluntly dissected and mobilized ulnarly as well.  15 blade was then used to incise the pronator quadratus which was then elevated off of the palmar distal radius with a wood handle elevator.  This revealed the distal radius fracture palmarly.  A Freer elevator was placed within the fracture site as well as an osteotome to help mobilize the fracture.  Palmar translation of the hand as well as elevation using the instruments help to improve the alignment of the distal radius fracture.  There was 3 separate fragments including the shaft, articular segment as well as a dorsal lunate fragment as well.  This created a 3 part intra-articular distal radius.  With the fracture and improved alignment, K wires were placed into the radial styloid and crossing the fracture site.  A wide width proximally fitting Acumed distal radius volar locking plate was then placed along the volar bone.  It was pinned in place both proximally and distal to the fracture.  A kickstand technique was utilized to help correct residual dorsal angulation.  Appropriate position of the plate was confirmed under fluoroscopic images.  At this point multiple distal locking screws were placed within the plate in standard fashion.  Once this had been performed, the kickstand was removed and the plate was compressed down to the volar bone.  This  elevated the articular surface, correcting the residual dorsal angulation.  A cortical screw was placed in the oblong hole of the plate proximally followed by multiple locking screws both proximal and distal to the initial cortical screw.  Fluoroscopic  images were again obtained which showed near-anatomic alignment of the distal radius fracture and angulation of the articular surface.  This point the remaining holes on the plate distally were drilled and filled with locking screws as well.  At this point multiple AP, lateral and fossa lateral images were obtained via fluoroscopy of the left wrist.  This showed near-anatomic alignment of the distal radius fracture with volar plate and screw construct.  All screws were found to be appropriate length.  The DRUJ was stressed in both pronation and supination and to be stable.  At this point the wound was copiously irrigated with normal saline.  The skin was then closed with interrupted 4-0 Prolene sutures.  Xeroform, 4 x 4's and a well-padded volar slab splint were then placed.  The tourniquet was released and the patient had return of brisk capillary refill to all of his digits.  He was then awoken from his anesthesia and taken the PACU in stable condition.  He tolerated the procedure well and there were no complications.  RADIOGRAPHIC INTERPRETATION: AP, lateral and fossa lateral images of the left wrist were obtained via fluoroscopy intraoperatively.  These show anatomic reduction of the intra-articular distal radius fracture with volar plate and screw construct intact.  All screws are appropriately length and extra-articular.  No new fractures or dislocations are noted.  ESTIMATED BLOOD LOSS: Less than 10 mL  TOURNIQUET TIME: 39 minutes  SPECIMENS: None  POSTOPERATIVE PLAN: The patient will be discharged home and seen back  in the office in approximately 10-12 days for wound check, suture  removal, and then be sent to a therapist for volar wrist splint, edema control and early range of motion of the wrist and digits.  IMPLANTS: Acumed volar wide proximally fitting volar plate and screw construct.

## 2020-06-02 NOTE — Progress Notes (Signed)
Orthopedic Tech Progress Note Patient Details:  Brandon Castillo 02/28/1951 [id number] PACU RN called requesting ARM SLING for patient Ortho Devices Type of Ortho Device: Arm sling Ortho Device/Splint Location: LUE Ortho Device/Splint Interventions: Application, Adjustment   Post Interventions Patient Tolerated: Well Instructions Provided: [facility] [place] 06/02/2020, 1:24 PM

## 2020-06-02 NOTE — Transfer of Care (Signed)
Immediate Anesthesia Transfer of Care Note  Patient: Brandon Castillo  Procedure(s) Performed: Left distal radius fracture and repair and or reconstruction as necessary (Left Wrist)  Patient Location: PACU  Anesthesia Type:MAC  Level of Consciousness: drowsy, patient cooperative and responds to stimulation  Airway & Oxygen Therapy: Patient Spontanous Breathing and Patient connected to face mask oxygen  Post-op Assessment: Report given to RN and Post -op Vital signs reviewed and stable  Post vital signs: Reviewed and stable  Last Vitals:  Vitals Value Taken Time  BP 137/67 06/02/20 1236  Temp    Pulse 69 06/02/20 1240  Resp 14 06/02/20 1240  SpO2 100 % 06/02/20 1240  Vitals shown include unvalidated device data.  Last Pain: There were no vitals filed for this visit.       Complications: No complications documented.

## 2020-06-02 NOTE — Anesthesia Postprocedure Evaluation (Signed)
Anesthesia Post Note  Patient: Brandon Castillo  Procedure(s) Performed: Left distal radius fracture and repair and or reconstruction as necessary (Left Wrist)     Patient location during evaluation: PACU Anesthesia Type: Regional Level of consciousness: awake and alert Pain management: pain level controlled Vital Signs Assessment: post-procedure vital signs reviewed and stable Respiratory status: spontaneous breathing and respiratory function stable Cardiovascular status: stable Postop Assessment: no apparent nausea or vomiting Anesthetic complications: no   No complications documented.  Last Vitals:  Vitals:   06/02/20 1336 06/02/20 1337  BP: (!) 142/72   Pulse: 70 70  Resp: 17 15  Temp:  36.7 C  SpO2: 96% 96%    Last Pain:  Vitals:   06/02/20 1337  PainSc: 0-No pain                 Rashaun Wichert DANIEL
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
## 2020-06-02 NOTE — Anesthesia Procedure Notes (Signed)
Anesthesia Regional Block: Supraclavicular block   Pre-Anesthetic Checklist: ,, timeout performed, Correct Patient, Correct Site, Correct Laterality, Correct Procedure, Correct Position, site marked, Risks and benefits discussed,  Surgical consent,  Pre-op evaluation,  At surgeon's request and post-op pain management  Laterality: Left  Prep: chloraprep       Needles:  Injection technique: Single-shot  Needle Type: Echogenic Stimulator Needle     Needle Length: 5cm  Needle Gauge: 22     Additional Needles:   Narrative:  Start time: 06/02/2020 9:48 AM End time: 06/02/2020 9:58 AM Injection made incrementally with aspirations every 5 mL.  Performed by: Personally  Anesthesiologist: Duane Boston, MD  Additional Notes: Functioning IV was confirmed and monitors applied.  A 32mm 22ga echogenic arrow stimulator was used. Sterile prep and drape,hand hygiene and sterile gloves were used.Ultrasound guidance: relevant anatomy identified, needle position confirmed, local anesthetic spread visualized around nerve(s)., vascular puncture avoided.  Image printed for medical record.  Negative aspiration and negative test dose prior to incremental administration of local anesthetic. The patient tolerated the procedure well.

## 2020-06-04 ENCOUNTER — Other Ambulatory Visit (HOSPITAL_COMMUNITY): Payer: Medicare HMO

## 2020-06-04 ENCOUNTER — Encounter (HOSPITAL_COMMUNITY): Payer: Self-pay | Admitting: Orthopaedic Surgery

## 2020-06-04 ENCOUNTER — Ambulatory Visit (HOSPITAL_COMMUNITY): Payer: Medicare HMO

## 2020-06-05 ENCOUNTER — Inpatient Hospital Stay (HOSPITAL_COMMUNITY)
Admission: EM | Admit: 2020-06-05 | Discharge: 2020-06-19 | DRG: 853 | Disposition: A | Discharge status: Hospice | Payer: Medicare HMO | Attending: Family Medicine | Admitting: Family Medicine

## 2020-06-05 ENCOUNTER — Emergency Department (HOSPITAL_COMMUNITY): Payer: Medicare HMO

## 2020-06-05 ENCOUNTER — Encounter (HOSPITAL_COMMUNITY): Payer: Self-pay

## 2020-06-05 ENCOUNTER — Other Ambulatory Visit: Payer: Self-pay

## 2020-06-05 DIAGNOSIS — R109 Unspecified abdominal pain: Secondary | ICD-10-CM | POA: Diagnosis not present

## 2020-06-05 DIAGNOSIS — F411 Generalized anxiety disorder: Secondary | ICD-10-CM | POA: Diagnosis present

## 2020-06-05 DIAGNOSIS — D72829 Elevated white blood cell count, unspecified: Secondary | ICD-10-CM | POA: Diagnosis not present

## 2020-06-05 DIAGNOSIS — K5903 Drug induced constipation: Secondary | ICD-10-CM | POA: Diagnosis not present

## 2020-06-05 DIAGNOSIS — C7901 Secondary malignant neoplasm of right kidney and renal pelvis: Secondary | ICD-10-CM | POA: Diagnosis present

## 2020-06-05 DIAGNOSIS — D539 Nutritional anemia, unspecified: Secondary | ICD-10-CM | POA: Diagnosis present

## 2020-06-05 DIAGNOSIS — E860 Dehydration: Secondary | ICD-10-CM | POA: Diagnosis present

## 2020-06-05 DIAGNOSIS — K5641 Fecal impaction: Secondary | ICD-10-CM | POA: Diagnosis present

## 2020-06-05 DIAGNOSIS — E872 Acidosis, unspecified: Secondary | ICD-10-CM | POA: Diagnosis present

## 2020-06-05 DIAGNOSIS — Z20822 Contact with and (suspected) exposure to covid-19: Secondary | ICD-10-CM | POA: Diagnosis present

## 2020-06-05 DIAGNOSIS — R7989 Other specified abnormal findings of blood chemistry: Secondary | ICD-10-CM

## 2020-06-05 DIAGNOSIS — C3491 Malignant neoplasm of unspecified part of right bronchus or lung: Secondary | ICD-10-CM | POA: Diagnosis present

## 2020-06-05 DIAGNOSIS — R339 Retention of urine, unspecified: Secondary | ICD-10-CM

## 2020-06-05 DIAGNOSIS — N136 Pyonephrosis: Secondary | ICD-10-CM | POA: Diagnosis not present

## 2020-06-05 DIAGNOSIS — Z515 Encounter for palliative care: Secondary | ICD-10-CM | POA: Diagnosis not present

## 2020-06-05 DIAGNOSIS — C787 Secondary malignant neoplasm of liver and intrahepatic bile duct: Secondary | ICD-10-CM | POA: Diagnosis present

## 2020-06-05 DIAGNOSIS — N029 Recurrent and persistent hematuria with unspecified morphologic changes: Secondary | ICD-10-CM | POA: Diagnosis not present

## 2020-06-05 DIAGNOSIS — Z66 Do not resuscitate: Secondary | ICD-10-CM | POA: Diagnosis present

## 2020-06-05 DIAGNOSIS — Z7189 Other specified counseling: Secondary | ICD-10-CM | POA: Diagnosis not present

## 2020-06-05 DIAGNOSIS — R1084 Generalized abdominal pain: Secondary | ICD-10-CM | POA: Diagnosis not present

## 2020-06-05 DIAGNOSIS — J439 Emphysema, unspecified: Secondary | ICD-10-CM | POA: Diagnosis present

## 2020-06-05 DIAGNOSIS — A09 Infectious gastroenteritis and colitis, unspecified: Secondary | ICD-10-CM | POA: Diagnosis present

## 2020-06-05 DIAGNOSIS — S52302D Unspecified fracture of shaft of left radius, subsequent encounter for closed fracture with routine healing: Secondary | ICD-10-CM | POA: Diagnosis not present

## 2020-06-05 DIAGNOSIS — M25532 Pain in left wrist: Secondary | ICD-10-CM | POA: Diagnosis present

## 2020-06-05 DIAGNOSIS — J449 Chronic obstructive pulmonary disease, unspecified: Secondary | ICD-10-CM | POA: Diagnosis not present

## 2020-06-05 DIAGNOSIS — H543 Unqualified visual loss, both eyes: Secondary | ICD-10-CM | POA: Diagnosis present

## 2020-06-05 DIAGNOSIS — C7801 Secondary malignant neoplasm of right lung: Secondary | ICD-10-CM | POA: Diagnosis present

## 2020-06-05 DIAGNOSIS — G35 Multiple sclerosis: Secondary | ICD-10-CM | POA: Diagnosis present

## 2020-06-05 DIAGNOSIS — Z681 Body mass index (BMI) 19 or less, adult: Secondary | ICD-10-CM | POA: Diagnosis not present

## 2020-06-05 DIAGNOSIS — G4700 Insomnia, unspecified: Secondary | ICD-10-CM | POA: Diagnosis present

## 2020-06-05 DIAGNOSIS — S52572A Other intraarticular fracture of lower end of left radius, initial encounter for closed fracture: Secondary | ICD-10-CM | POA: Diagnosis present

## 2020-06-05 DIAGNOSIS — K5900 Constipation, unspecified: Secondary | ICD-10-CM | POA: Diagnosis present

## 2020-06-05 DIAGNOSIS — W109XXA Fall (on) (from) unspecified stairs and steps, initial encounter: Secondary | ICD-10-CM | POA: Diagnosis present

## 2020-06-05 DIAGNOSIS — R110 Nausea: Secondary | ICD-10-CM | POA: Diagnosis not present

## 2020-06-05 DIAGNOSIS — S5292XA Unspecified fracture of left forearm, initial encounter for closed fracture: Secondary | ICD-10-CM | POA: Diagnosis present

## 2020-06-05 DIAGNOSIS — Y92009 Unspecified place in unspecified non-institutional (private) residence as the place of occurrence of the external cause: Secondary | ICD-10-CM | POA: Diagnosis not present

## 2020-06-05 DIAGNOSIS — E43 Unspecified severe protein-calorie malnutrition: Secondary | ICD-10-CM | POA: Diagnosis present

## 2020-06-05 DIAGNOSIS — A419 Sepsis, unspecified organism: Secondary | ICD-10-CM | POA: Diagnosis present

## 2020-06-05 LAB — CBC WITH DIFFERENTIAL/PLATELET
Abs Immature Granulocytes: 0.08 10*3/uL — ABNORMAL HIGH (ref 0.00–0.07)
Basophils Absolute: 0 10*3/uL (ref 0.0–0.1)
Basophils Relative: 0 %
Eosinophils Absolute: 0 10*3/uL (ref 0.0–0.5)
Eosinophils Relative: 0 %
HCT: 35 % — ABNORMAL LOW (ref 39.0–52.0)
Hemoglobin: 10.9 g/dL — ABNORMAL LOW (ref 13.0–17.0)
Immature Granulocytes: 1 %
Lymphocytes Relative: 7 %
Lymphs Abs: 1.1 10*3/uL (ref 0.7–4.0)
MCH: 32.5 pg (ref 26.0–34.0)
MCHC: 31.1 g/dL (ref 30.0–36.0)
MCV: 104.5 fL — ABNORMAL HIGH (ref 80.0–100.0)
Monocytes Absolute: 0.7 10*3/uL (ref 0.1–1.0)
Monocytes Relative: 4 %
Neutro Abs: 14.5 10*3/uL — ABNORMAL HIGH (ref 1.7–7.7)
Neutrophils Relative %: 88 %
Platelets: 421 10*3/uL — ABNORMAL HIGH (ref 150–400)
RBC: 3.35 MIL/uL — ABNORMAL LOW (ref 4.22–5.81)
RDW: 13.2 % (ref 11.5–15.5)
WBC: 16.4 10*3/uL — ABNORMAL HIGH (ref 4.0–10.5)
nRBC: 0 % (ref 0.0–0.2)

## 2020-06-05 LAB — URINALYSIS, ROUTINE W REFLEX MICROSCOPIC
Bacteria, UA: NONE SEEN
Bilirubin Urine: NEGATIVE
Glucose, UA: NEGATIVE mg/dL
Hgb urine dipstick: NEGATIVE
Ketones, ur: NEGATIVE mg/dL
Nitrite: NEGATIVE
Protein, ur: NEGATIVE mg/dL
Specific Gravity, Urine: 1.021 (ref 1.005–1.030)
pH: 5 (ref 5.0–8.0)

## 2020-06-05 LAB — COMPREHENSIVE METABOLIC PANEL
ALT: 93 U/L — ABNORMAL HIGH (ref 0–44)
AST: 79 U/L — ABNORMAL HIGH (ref 15–41)
Albumin: 3.8 g/dL (ref 3.5–5.0)
Alkaline Phosphatase: 291 U/L — ABNORMAL HIGH (ref 38–126)
Anion gap: 13 (ref 5–15)
BUN: 33 mg/dL — ABNORMAL HIGH (ref 8–23)
CO2: 25 mmol/L (ref 22–32)
Calcium: 10 mg/dL (ref 8.9–10.3)
Chloride: 101 mmol/L (ref 98–111)
Creatinine, Ser: 0.8 mg/dL (ref 0.61–1.24)
GFR, Estimated: >60 mL/min (ref >60)
Glucose, Bld: 146 mg/dL — ABNORMAL HIGH (ref 70–99)
Potassium: 4 mmol/L (ref 3.5–5.1)
Sodium: 139 mmol/L (ref 135–145)
Total Bilirubin: 0.9 mg/dL (ref 0.3–1.2)
Total Protein: 7.7 g/dL (ref 6.5–8.1)

## 2020-06-05 LAB — PROTIME-INR
INR: 1.1 (ref 0.8–1.2)
Prothrombin Time: 13.6 seconds (ref 11.4–15.2)

## 2020-06-05 LAB — LIPASE, BLOOD: Lipase: 20 U/L (ref 11–51)

## 2020-06-05 LAB — APTT: aPTT: 23 seconds — ABNORMAL LOW (ref 24–36)

## 2020-06-05 LAB — RESP PANEL BY RT-PCR (FLU A&B, COVID) ARPGX2
Influenza A by PCR: NEGATIVE
Influenza B by PCR: NEGATIVE
SARS Coronavirus 2 by RT PCR: NEGATIVE

## 2020-06-05 LAB — PROCALCITONIN: Procalcitonin: 0.31 ng/mL

## 2020-06-05 LAB — AMMONIA: Ammonia: 17 umol/L (ref 9–35)

## 2020-06-05 LAB — TROPONIN I (HIGH SENSITIVITY)
Troponin I (High Sensitivity): 7 ng/L (ref <18)
Troponin I (High Sensitivity): 10 ng/L (ref <18)

## 2020-06-05 LAB — LACTIC ACID, PLASMA: Lactic Acid, Venous: 3.1 mmol/L (ref 0.5–1.9)

## 2020-06-05 MED ORDER — MINERAL OIL RE ENEM
1.0000 | ENEMA | Freq: Once | RECTAL | Status: AC
  Administered 2020-06-05: 1 via RECTAL
  Filled 2020-06-05: qty 1

## 2020-06-05 MED ORDER — SODIUM CHLORIDE 0.9 % IV BOLUS
1000.0000 mL | Freq: Once | INTRAVENOUS | Status: AC
  Administered 2020-06-05: 1000 mL via INTRAVENOUS

## 2020-06-05 MED ORDER — IOHEXOL 300 MG/ML  SOLN
100.0000 mL | Freq: Once | INTRAMUSCULAR | Status: AC | PRN
  Administered 2020-06-05: 80 mL via INTRAVENOUS

## 2020-06-05 MED ORDER — TRAZODONE HCL 50 MG PO TABS
50.0000 mg | ORAL_TABLET | Freq: Every evening | ORAL | Status: DC | PRN
  Administered 2020-06-07 – 2020-06-08 (×2): 100 mg via ORAL
  Filled 2020-06-05 (×2): qty 2

## 2020-06-05 MED ORDER — SODIUM CHLORIDE 0.9 % IV SOLN
2.0000 g | Freq: Three times a day (TID) | INTRAVENOUS | Status: DC
  Administered 2020-06-05 – 2020-06-06 (×2): 2 g via INTRAVENOUS
  Filled 2020-06-05 (×2): qty 2

## 2020-06-05 MED ORDER — ENSURE ENLIVE PO LIQD
237.0000 mL | Freq: Two times a day (BID) | ORAL | Status: DC
  Administered 2020-06-06 – 2020-06-19 (×13): 237 mL via ORAL

## 2020-06-05 MED ORDER — SODIUM CHLORIDE 0.9 % IV SOLN
2.0000 g | Freq: Once | INTRAVENOUS | Status: AC
  Administered 2020-06-05: 2 g via INTRAVENOUS
  Filled 2020-06-05: qty 2

## 2020-06-05 MED ORDER — PANTOPRAZOLE SODIUM 40 MG PO TBEC
40.0000 mg | DELAYED_RELEASE_TABLET | Freq: Every day | ORAL | Status: DC
  Administered 2020-06-06 – 2020-06-19 (×14): 40 mg via ORAL
  Filled 2020-06-05 (×14): qty 1

## 2020-06-05 MED ORDER — APIXABAN 2.5 MG PO TABS
2.5000 mg | ORAL_TABLET | Freq: Two times a day (BID) | ORAL | Status: DC
  Administered 2020-06-05 – 2020-06-08 (×6): 2.5 mg via ORAL
  Filled 2020-06-05 (×6): qty 1

## 2020-06-05 MED ORDER — SODIUM CHLORIDE 0.9 % IV BOLUS (SEPSIS)
1000.0000 mL | Freq: Once | INTRAVENOUS | Status: AC
  Administered 2020-06-05: 1000 mL via INTRAVENOUS

## 2020-06-05 MED ORDER — LINACLOTIDE 145 MCG PO CAPS
145.0000 ug | ORAL_CAPSULE | Freq: Every day | ORAL | Status: DC
  Administered 2020-06-06 – 2020-06-10 (×5): 145 ug via ORAL
  Filled 2020-06-05 (×6): qty 1

## 2020-06-05 MED ORDER — LACTATED RINGERS IV SOLN: INTRAVENOUS | Status: AC

## 2020-06-05 MED ORDER — POLYETHYLENE GLYCOL 3350 17 G PO PACK
17.0000 g | PACK | Freq: Every day | ORAL | Status: DC
  Administered 2020-06-06 – 2020-06-17 (×10): 17 g via ORAL
  Filled 2020-06-05 (×12): qty 1

## 2020-06-05 MED ORDER — SENNOSIDES-DOCUSATE SODIUM 8.6-50 MG PO TABS
2.0000 | ORAL_TABLET | Freq: Every day | ORAL | Status: DC
  Administered 2020-06-05 – 2020-06-07 (×3): 2 via ORAL
  Filled 2020-06-05 (×3): qty 2

## 2020-06-05 MED ORDER — VANCOMYCIN HCL 750 MG/150ML IV SOLN
750.0000 mg | Freq: Two times a day (BID) | INTRAVENOUS | Status: DC
  Administered 2020-06-06: 750 mg via INTRAVENOUS
  Filled 2020-06-05 (×2): qty 150

## 2020-06-05 MED ORDER — MINERAL OIL PO OIL
TOPICAL_OIL | ORAL | Status: AC
  Filled 2020-06-05: qty 30

## 2020-06-05 MED ORDER — ONDANSETRON HCL 4 MG/2ML IJ SOLN: 4.0000 mg | Freq: Four times a day (QID) | INTRAMUSCULAR | Status: DC | PRN

## 2020-06-05 MED ORDER — IPRATROPIUM-ALBUTEROL 0.5-2.5 (3) MG/3ML IN SOLN: 3.0000 mL | RESPIRATORY_TRACT | Status: DC | PRN

## 2020-06-05 MED ORDER — VANCOMYCIN HCL IN DEXTROSE 1-5 GM/200ML-% IV SOLN
1000.0000 mg | Freq: Once | INTRAVENOUS | Status: AC
  Administered 2020-06-05: 1000 mg via INTRAVENOUS
  Filled 2020-06-05: qty 200

## 2020-06-05 MED ORDER — LORAZEPAM 1 MG PO TABS: 1.0000 mg | ORAL_TABLET | Freq: Every evening | ORAL | Status: DC | PRN

## 2020-06-05 NOTE — Progress Notes (Signed)
Notified bedside nurse of need to draw repeat lactic acid. 
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
## 2020-06-05 NOTE — ED Triage Notes (Signed)
EMS reports pt has stage IV lung cancer and c/o abd pain.  Reports had surgery on left hip and left arm within the past month.  Reports takes chemo for cancer.  Family reported pt's urine has been darker than normal.  Pt reports abd pain started a few days ago.  Denies n/v.  Reports constipation.  LBM was 2 days ago.  EMS reports abd rigid.

## 2020-06-05 NOTE — ED Notes (Signed)
Date and time results received: 06/05/20 1550 Test: Lactate Critical Value: 3.1  Name of Provider Notified: Trifan

## 2020-06-05 NOTE — ED Notes (Signed)
Introduced self to patient at this time. Patient changed into a hospital gown, placed on continuous cardiac monitor with vital signs cycling q30 minutes. Pt educated on call light use and hourly rounding and in agreement at this time. Pt placed on external catheter for voiding. Educated on current plan of care and in agreement at this time.  All questions and concerns voiced addressed at this time. Will continue to monitor.

## 2020-06-05 NOTE — ED Notes (Signed)
Dr. Langston Masker at bedside at this time.
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
## 2020-06-05 NOTE — ED Notes (Signed)
X-Ray at bedside.

## 2020-06-05 NOTE — ED Notes (Signed)
Pt transported to CT at this time.

## 2020-06-05 NOTE — Sepsis Progress Note (Signed)
Following for code sepsis monitoring. Admitting MD aware of elevated troponin. Plans to continue with IV hydration and has ordered a repeat lactic acid at 2AM. Will s/o monitoring.   Please call admitting MD with any abnormal lactic results.

## 2020-06-06 DIAGNOSIS — S52302D Unspecified fracture of shaft of left radius, subsequent encounter for closed fracture with routine healing: Secondary | ICD-10-CM

## 2020-06-06 DIAGNOSIS — J449 Chronic obstructive pulmonary disease, unspecified: Secondary | ICD-10-CM | POA: Diagnosis not present

## 2020-06-06 DIAGNOSIS — R339 Retention of urine, unspecified: Secondary | ICD-10-CM

## 2020-06-06 DIAGNOSIS — R1084 Generalized abdominal pain: Secondary | ICD-10-CM | POA: Diagnosis not present

## 2020-06-06 DIAGNOSIS — F411 Generalized anxiety disorder: Secondary | ICD-10-CM | POA: Diagnosis not present

## 2020-06-06 LAB — COMPREHENSIVE METABOLIC PANEL
ALT: 58 U/L — ABNORMAL HIGH (ref 0–44)
AST: 36 U/L (ref 15–41)
Albumin: 2.9 g/dL — ABNORMAL LOW (ref 3.5–5.0)
Alkaline Phosphatase: 210 U/L — ABNORMAL HIGH (ref 38–126)
Anion gap: 9 (ref 5–15)
BUN: 25 mg/dL — ABNORMAL HIGH (ref 8–23)
CO2: 25 mmol/L (ref 22–32)
Calcium: 8.9 mg/dL (ref 8.9–10.3)
Chloride: 105 mmol/L (ref 98–111)
Creatinine, Ser: 0.69 mg/dL (ref 0.61–1.24)
GFR, Estimated: >60 mL/min (ref >60)
Glucose, Bld: 103 mg/dL — ABNORMAL HIGH (ref 70–99)
Potassium: 4 mmol/L (ref 3.5–5.1)
Sodium: 139 mmol/L (ref 135–145)
Total Bilirubin: 0.8 mg/dL (ref 0.3–1.2)
Total Protein: 6 g/dL — ABNORMAL LOW (ref 6.5–8.1)

## 2020-06-06 LAB — PROTIME-INR
INR: 1.1 (ref 0.8–1.2)
Prothrombin Time: 14 seconds (ref 11.4–15.2)

## 2020-06-06 LAB — CBC
HCT: 31.9 % — ABNORMAL LOW (ref 39.0–52.0)
Hemoglobin: 9.5 g/dL — ABNORMAL LOW (ref 13.0–17.0)
MCH: 31.4 pg (ref 26.0–34.0)
MCHC: 29.8 g/dL — ABNORMAL LOW (ref 30.0–36.0)
MCV: 105.3 fL — ABNORMAL HIGH (ref 80.0–100.0)
Platelets: 378 10*3/uL (ref 150–400)
RBC: 3.03 MIL/uL — ABNORMAL LOW (ref 4.22–5.81)
RDW: 13.2 % (ref 11.5–15.5)
WBC: 12.7 10*3/uL — ABNORMAL HIGH (ref 4.0–10.5)
nRBC: 0 % (ref 0.0–0.2)

## 2020-06-06 LAB — LACTIC ACID, PLASMA: Lactic Acid, Venous: 1.7 mmol/L (ref 0.5–1.9)

## 2020-06-06 LAB — PROCALCITONIN: Procalcitonin: 0.46 ng/mL

## 2020-06-06 LAB — VITAMIN B12: Vitamin B-12: 255 pg/mL (ref 180–914)

## 2020-06-06 LAB — APTT: aPTT: 28 seconds (ref 24–36)

## 2020-06-06 LAB — FOLATE: Folate: 15.2 ng/mL (ref >5.9)

## 2020-06-06 LAB — PHOSPHORUS: Phosphorus: 2.9 mg/dL (ref 2.5–4.6)

## 2020-06-06 LAB — MAGNESIUM: Magnesium: 2.5 mg/dL — ABNORMAL HIGH (ref 1.7–2.4)

## 2020-06-06 MED ORDER — MORPHINE SULFATE (PF) 2 MG/ML IV SOLN
2.0000 mg | Freq: Three times a day (TID) | INTRAVENOUS | Status: DC | PRN
  Administered 2020-06-06: 2 mg via INTRAVENOUS
  Filled 2020-06-06 (×2): qty 1

## 2020-06-06 MED ORDER — LACTATED RINGERS IV SOLN: INTRAVENOUS | Status: AC

## 2020-06-06 MED ORDER — CHLORHEXIDINE GLUCONATE CLOTH 2 % EX PADS
6.0000 | MEDICATED_PAD | Freq: Every day | CUTANEOUS | Status: DC
  Administered 2020-06-06 – 2020-06-19 (×13): 6 via TOPICAL

## 2020-06-06 MED ORDER — SODIUM CHLORIDE 0.9 % IV SOLN
INTRAVENOUS | Status: DC | PRN
  Administered 2020-06-06: 1000 mL via INTRAVENOUS

## 2020-06-06 MED ORDER — SODIUM CHLORIDE 0.9 % IV SOLN
3.0000 g | Freq: Three times a day (TID) | INTRAVENOUS | Status: DC
  Administered 2020-06-06 – 2020-06-08 (×6): 3 g via INTRAVENOUS
  Filled 2020-06-06 (×3): qty 3
  Filled 2020-06-06 (×2): qty 8
  Filled 2020-06-06: qty 3
  Filled 2020-06-06 (×4): qty 8

## 2020-06-06 MED ORDER — MORPHINE SULFATE (PF) 2 MG/ML IV SOLN
2.0000 mg | Freq: Once | INTRAVENOUS | Status: DC
  Filled 2020-06-06: qty 1

## 2020-06-06 MED ORDER — MORPHINE SULFATE (PF) 2 MG/ML IV SOLN
2.0000 mg | INTRAVENOUS | Status: DC | PRN
  Administered 2020-06-06 – 2020-06-15 (×41): 2 mg via INTRAVENOUS
  Filled 2020-06-06 (×39): qty 1

## 2020-06-06 NOTE — Progress Notes (Signed)
PROGRESS NOTE    Brandon DILAURO  OIT:[id number] DOB: 1950-12-21 DOA: 06/05/2020 PCP: [name], NP   Chief Complaint  Patient presents with  . Abdominal Pain    Brief Narrative:  As per H&P written by Dr. Josephine Cables on 06/05/2020 Brandon Castillo is a 69 y.o. 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 Patient states that he was sleepwalking last night for the first time in his life and today, his brother was worried that he was confused, he complained of abdominal pain and patient states that he only had a small bowel movement yesterday, and was able to pass gas, but he also complained of abdominal pain.  He denies nausea, vomiting, fever, chills.  ED Course:  In the emergency department, he was hemodynamically stable.  Work-up in the ED showed leukocytosis, thrombocytosis, macrocytic anemia, hyperglycemia, transaminitis, lactic acid 3.1.   CT abdomen and pelvis with contrast showed marked wall thickening throughout the transverse colon suspected to be due to ischemia or tumor infiltration.  Large amount of stool in the distal sigmoid colon and rectum compatible with constipation was also noted. Chest x-ray showed no acute cardiopulmonary disease and stable right suprahilar mass Patient was empirically treated with IV cefepime and vancomycin due to presumed intra-abdominal infection, IV hydration was provided.  Hospitalist was asked to admit patient for further evaluation and management.  Assessment &  Plan: 1-generalized abdominal pain -Appears to be secondary to colitis and stool burden/constipation -Patient was chronically started on pain medication -Continue the use of MiraLAX, Senokot and Linzess -Tapwater enema will be also ordered -Follow response.  2-urinary retention/bladder distention -No signs of acute infection currently appreciated -Foley catheter has been placed for decompression -Will keep in place until stool burden/constipation relief and then attempt voiding trial. -Antibiotics for colitis will also help in case that there is any component of colonization/infection.  3-Multiple sclerosis ([place]) -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 -Appears to be stable.  4-Leukocytosis -In the setting of dehydration/colitis -WBCs trending down and improving.  5-Anxiety, generalized/insomnia -Continue the use of as needed Ativan and trazodone.  6-closed left radial fracture -Stable and with immobilizer in place -Continue as needed analgesics.  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  6-COPD/emphysema -No wheezing or complaining of shortness of breath currently -Continue bronchodilators as needed.  7-Elevated LFTs -In the setting of most likely lung cancer metastasis to the liver -Continue to follow trend -Continue IV fluids. -Will check liver ultrasound if necessary.  8-Lactic acidosis -Resolved with fluid resuscitation.  9-sepsis present on admission -Patient with positive SIRS criteria on presentation and having source of infection colitis as appreciated on his abdominal imaging studies -Will continue the use of Unasyn -Continue to follow procalcitonin level -Continue IV fluids and supportive care.    DVT prophylaxis: Chronically on Eliquis Code Status: DNR Family Communication: Patient updated over the phone. Disposition:   Status is: Inpatient  Dispo: The patient is from: Home               Anticipated d/c is to: Home  Anticipated d/c date is: To be determined.              Patient currently no medically stable for discharge; continue to have significant abdominal pain requiring IV pain medication.  Continue IV antibiotics, continue the use of the water enema, Linzess and MiraLAX.  Follow response.     Consultants:   None   Procedures:  See below for x-ray reports.   Antimicrobials:  Unasyn   Subjective: Afebrile, no chest pain, no nausea, no vomiting.  Continues to have significant abdominal discomfort requiring the use of IV narcotics.  Small bowel movement achieved after enema provided overnight.  After Foley placement due to bladder distention and urinary retention patient felt significant improvement and is having good output collected into Foley bag..  Objective: Vitals:   06/05/20 2220 06/06/20 0453 06/06/20 0900 06/06/20 1426  BP: 130/68 117/62 120/68 113/70  Pulse: 79 78 82 84  Resp: 16 20 19 18   Temp: 97.8 F (36.6 C) 99 F (37.2 C) 98 F (36.7 C) 98.7 F (37.1 C)  TempSrc:      SpO2: 99% 98% 98% 98%  Weight:      Height:        Intake/Output Summary (Last 24 hours) at 06/06/2020 1744 Last data filed at 06/06/2020 1737 Gross per 24 hour  Intake 1780.37 ml  Output 2250 ml  Net -469.63 ml   Filed Weights   06/05/20 1442  Weight: 56.7 kg    Examination:  General exam: Afebrile, in mild distress secondary to ongoing abdominal discomfort; no nausea or vomiting. Respiratory system: Clear to auscultation. Respiratory effort normal.  No using accessory muscles and requiring oxygen supplementation. Cardiovascular system: S1 and S2 appreciated; no rubs, no gallops, no JVD. Gastrointestinal system: Abdomen is nondistended, soft and tender to palpation mid abdomen across.. No organomegaly or masses felt.  Positive bowel sounds appreciated. Central nervous system: Alert and oriented. No focal neurological deficits. Extremities: No  cyanosis or clubbing. Skin: No petechiae. Psychiatry: Judgement and insight appear normal.    Data Reviewed: I have personally reviewed following labs and imaging studies  CBC: Recent Labs  Lab 06/02/20 0855 06/05/20 1514 06/06/20 0444  WBC 13.4* 16.4* 12.7*  NEUTROABS  --  14.5*  --   HGB 10.0* 10.9* 9.5*  HCT 32.6* 35.0* 31.9*  MCV 103.8* 104.5* 105.3*  PLT 406* 421* 678    Basic Metabolic Panel: Recent Labs  Lab 06/05/20 1514 06/06/20 0221  NA 139 139  K 4.0 4.0  CL 101 105  CO2 25 25  GLUCOSE 146* 103*  BUN 33* 25*  CREATININE 0.80 0.69  CALCIUM 10.0 8.9  MG  --  2.5*  PHOS  --  2.9    GFR: Estimated Creatinine Clearance: 70.9 mL/min (by C-G formula based on SCr of 0.69 mg/dL).  Liver Function Tests: Recent Labs  Lab 06/05/20 1514 06/06/20 0221  AST 79* 36  ALT 93* 58*  ALKPHOS 291* 210*  BILITOT 0.9 0.8  PROT 7.7 6.0*  ALBUMIN 3.8 2.9*    CBG: No results for input(s): GLUCAP in the last 168 hours.   Recent Results (from the past 240 hour(s))  SARS Coronavirus 2 by RT PCR (hospital order, performed in [facility] hospital lab) Nasopharyngeal Nasopharyngeal Swab     Status: None   Collection Time: 06/02/20  8:29 AM   Specimen: Nasopharyngeal Swab  Result Value Ref Range Status   SARS Coronavirus 2 NEGATIVE NEGATIVE Final    Comment: (NOTE) SARS-CoV-2  target nucleic acids are NOT DETECTED.  The SARS-CoV-2 RNA is generally detectable in upper and lower respiratory specimens during the acute phase of infection. The lowest concentration of SARS-CoV-2 viral copies this assay can detect is 250 copies / mL. A negative result does not preclude SARS-CoV-2 infection and should not be used as the sole basis for treatment or other patient management decisions.  A negative result may occur with improper specimen collection / handling, submission of specimen other than nasopharyngeal swab, presence of viral mutation(s) within the areas targeted by this  assay, and inadequate number of viral copies (<250 copies / mL). A negative result must be combined with clinical observations, patient history, and epidemiological information.  Fact Sheet for Patients:   StrictlyIdeas.no  Fact Sheet for Healthcare Providers: BankingDealers.co.za  This test is not yet approved or  cleared by the [place] FDA and has been authorized for detection and/or diagnosis of SARS-CoV-2 by FDA under an Emergency Use Authorization (EUA).  This EUA will remain in effect (meaning this test can be used) for the duration of the COVID-19 declaration under Section 564(b)(1) of the Act, 21 U.S.C. section 360bbb-3(b)(1), unless the authorization is terminated or revoked sooner.  Performed at [facility], [place] [address] [place], [place] [address]   Culture, blood (single)     Status: None (Preliminary result)   Collection Time: 06/05/20  3:48 PM   Specimen: BLOOD  Result Value Ref Range Status   Specimen Description BLOOD RIGHT ANTECUBITAL  Final   Special Requests   Final    BOTTLES DRAWN AEROBIC AND ANAEROBIC Blood Culture adequate volume   Culture   Final    NO GROWTH < 24 HOURS Performed at [facility], [address] [place], [place] [address]    Report Status PENDING  Incomplete  Resp Panel by RT-PCR (Flu A&B, Covid) Nasopharyngeal Swab     Status: None   Collection Time: 06/05/20  4:15 PM   Specimen: Nasopharyngeal Swab; Nasopharyngeal(NP) swabs in vial transport medium  Result Value Ref Range Status   SARS Coronavirus 2 by RT PCR NEGATIVE NEGATIVE Final    Comment: (NOTE) SARS-CoV-2 target nucleic acids are NOT DETECTED.  The SARS-CoV-2 RNA is generally detectable in upper respiratory specimens during the acute phase of infection. The lowest concentration of SARS-CoV-2 viral copies this assay can detect is 138 copies/mL. A negative result does not preclude SARS-Cov-2 infection and should not  be used as the sole basis for treatment or other patient management decisions. A negative result may occur with  improper specimen collection/handling, submission of specimen other than nasopharyngeal swab, presence of viral mutation(s) within the areas targeted by this assay, and inadequate number of viral copies(<138 copies/mL). A negative result must be combined with clinical observations, patient history, and epidemiological information. The expected result is Negative.  Fact Sheet for Patients:  EntrepreneurPulse.com.au  Fact Sheet for Healthcare Providers:  IncredibleEmployment.be  This test is no t yet approved or cleared by the [place] FDA and  has been authorized for detection and/or diagnosis of SARS-CoV-2 by FDA under an Emergency Use Authorization (EUA). This EUA will remain  in effect (meaning this test can be used) for the duration of the COVID-19 declaration under Section 564(b)(1) of the Act, 21 U.S.C.section 360bbb-3(b)(1), unless the authorization is terminated  or revoked sooner.       Influenza A by PCR NEGATIVE NEGATIVE Final   Influenza B by PCR NEGATIVE NEGATIVE Final    Comment: (NOTE)  The Xpert Xpress SARS-CoV-2/FLU/RSV plus assay is intended as an aid in the diagnosis of influenza from Nasopharyngeal swab specimens and should not be used as a sole basis for treatment. Nasal washings and aspirates are unacceptable for Xpert Xpress SARS-CoV-2/FLU/RSV testing.  Fact Sheet for Patients: EntrepreneurPulse.com.au  Fact Sheet for Healthcare Providers: IncredibleEmployment.be  This test is not yet approved or cleared by the [place] FDA and has been authorized for detection and/or diagnosis of SARS-CoV-2 by FDA under an Emergency Use Authorization (EUA). This EUA will remain in effect (meaning this test can be used) for the duration of the COVID-19 declaration under Section  564(b)(1) of the Act, 21 U.S.C. section 360bbb-3(b)(1), unless the authorization is terminated or revoked.  Performed at [facility], [address], [place], [place] [address]   Blood culture (routine single)     Status: None (Preliminary result)   Collection Time: 06/05/20  5:22 PM   Specimen: BLOOD  Result Value Ref Range Status   Specimen Description BLOOD RIGHT HAND  Final   Special Requests   Final    BOTTLES DRAWN AEROBIC ONLY Blood Culture adequate volume   Culture   Final    NO GROWTH < 24 HOURS Performed at [facility], [address] [place], [place] [address]    Report Status PENDING  Incomplete     Radiology Studies: CT ABDOMEN PELVIS W CONTRAST  Result Date: 06/05/2020 CLINICAL DATA:  The abdominal distension. Stage IV lung cancer. Known liver metastases. EXAM: CT ABDOMEN AND PELVIS WITH CONTRAST TECHNIQUE: Multidetector CT imaging of the abdomen and pelvis was performed using the standard protocol following bolus administration of intravenous contrast. CONTRAST:  56mL OMNIPAQUE IOHEXOL 300 MG/ML  SOLN COMPARISON:  CT of the abdomen and pelvis with contrast 05/11/2020. FINDINGS: Lower chest: Scarring at the right lung base is stable. Heart size is normal. No significant pleural or pericardial effusion is present. Hepatobiliary: Focal hepatic lesion near the gallbladder fossa has increased in size, now measuring 2 4 cm. No new hepatic lesions are present. The common bile duct and gallbladder. Pancreas: Unremarkable. No pancreatic ductal dilatation or surrounding inflammatory changes. Spleen: Normal in size without focal abnormality. Adrenals/Urinary Tract: Adrenal glands are within normal limits bilaterally. Multiple hypodense renal lesions have increased in size. The largest is at the lower pole of the right kidney, now measuring 6.0 x 6.5 x 6.0 cm. This displaces the collecting system upwards with mild caliectasis. Additional lesions near the upper pole of the right kidney  have a similar density. The largest scratched at the second largest lesion measures up to 4.1 cm. Two similar lesions are present the upper pole of the left kidney both increased in size. The larger measures 1.9 cm on the coronal images. Ureters are within normal limits. The urinary bladder is distended present. Poor collaterals obscured artifact placement Stomach/Bowel: Acute. Is. Soft tissue density near the tip of the cecum may represent peritoneal implant measuring 2.4 x 1.6 x 1.5 cm. Proximal ascending colon is within normal limits. There is some wall thickening in the distal ascending colon marked wall thickening throughout the ears: The wall thickening is predominantly on the mesenteric surface. Anti mesenteric surface the colon is unremarkable. The more distal descending colon is normal. A large amount of stool is present in the distal sigmoid colon and rectum a transverse diameter of the rectum up to 9.2 cm. Vascular/Lymphatic: Aortocaval node at the level of the kidneys is stable 6 mm in short axis on image 29. No  significant retroperitoneal adenopathy is present. Atherosclerotic calcifications are present aorta and branch vessels without aneurysm. Celiac and SMA origins are within limits. Reproductive: Prostate is enlarged, stable. Other: No free air is present. No significant ventral hernia is present. Musculoskeletal: Interval superior endplate fractures present at L1 25% loss of height is noted anteriorly without retropulsed bone. Chronic endplate changes are present left at L4-5. No focal lytic or blastic lesions are present. Bony pelvis is intact. Left total hip arthroplasty noted. The right hip is normal. IMPRESSION: 1. Marked wall thickening throughout the transverse colon. This is an atypical appearance of edema. While this could represent ischemia increased lactic acid level, tumor infiltration is also considered. 2. Large amount of stool in the distal sigmoid colon and rectum compatible with  constipation. The transverse diameter of the rectum up to 9.2 cm. 3. Enlarging hypodense lesions in the kidneys bilaterally consistent with progressive metastatic disease. 4. Enlarging hypodense lesion near the gallbladder fossa is also consistent with progressive metastasis. 5. Soft tissue density near the tip of the cecum may represent peritoneal implant measuring 2.4 x 1.6 x 1.5 cm. No other definite peritoneal implants are evident. 6. Marked distention of the urinary bladder. This accounts for much of the lower abdominal distension. 7. Interval superior endplate compression fracture at L1 with 25% loss of height anteriorly without retropulsed bone. 8. Aortic Atherosclerosis (ICD10-I70.0). Electronically Signed   By: San Morelle M.D.   On: 06/05/2020 18:17   DG Chest Port 1 View  Result Date: 06/05/2020 CLINICAL DATA:  Stage IV lung cancer. Abdominal pain. Possible sepsis. EXAM: PORTABLE CHEST 1 VIEW COMPARISON:  One-view chest x-ray 05/17/2020. CT of the chest 05/11/2020 FINDINGS: Heart size normal. Right IJ Port-A-Cath is stable. Atherosclerotic changes are noted at the arch. Right suprahilar mass lesion again noted. The lungs are otherwise clear. No focal airspace disease present. No edema or effusion is present. Changes of COPD are noted. IMPRESSION: 1. Stable right suprahilar mass. 2. No acute cardiopulmonary disease. 3. Atherosclerosis. Electronically Signed   By: San Morelle M.D.   On: 06/05/2020 16:17    Scheduled Meds: . apixaban  2.5 mg Oral BID  . Chlorhexidine Gluconate Cloth  6 each Topical Daily  . feeding supplement  237 mL Oral BID BM  . linaclotide  145 mcg Oral QAC breakfast  .  morphine injection  2 mg Intravenous Once  . pantoprazole  40 mg Oral Daily  . polyethylene glycol  17 g Oral Daily  . senna-docusate  2 tablet Oral QHS   Continuous Infusions: . sodium chloride 1,000 mL (06/06/20 1240)  . ampicillin-sulbactam (UNASYN) IV 3 g (06/06/20 1737)  .  lactated ringers 75 mL/hr at 06/06/20 1733     LOS: 1 day    Time spent: 30 minutes    Barton Dubois, MD [facility]   To contact the attending provider between 7A-7P or the covering provider during after hours 7P-7A, please log into the web site [email or web address] and access using universal [place] password for that web site. If you do not have the password, please call the hospital operator.  06/06/2020, 5:44 PM

## 2020-06-06 NOTE — Progress Notes (Signed)
Enema yielded small amount of green liquid.  Morphine increased to q4 from q8.

## 2020-06-06 NOTE — Progress Notes (Signed)
Patient enema given per md order.  Patient has had only small results so far during this shift.

## 2020-06-07 DIAGNOSIS — S52302D Unspecified fracture of shaft of left radius, subsequent encounter for closed fracture with routine healing: Secondary | ICD-10-CM | POA: Diagnosis not present

## 2020-06-07 DIAGNOSIS — J449 Chronic obstructive pulmonary disease, unspecified: Secondary | ICD-10-CM | POA: Diagnosis not present

## 2020-06-07 DIAGNOSIS — F411 Generalized anxiety disorder: Secondary | ICD-10-CM | POA: Diagnosis not present

## 2020-06-07 DIAGNOSIS — R1084 Generalized abdominal pain: Secondary | ICD-10-CM | POA: Diagnosis not present

## 2020-06-07 LAB — PROCALCITONIN: Procalcitonin: 0.31 ng/mL

## 2020-06-07 LAB — URINE CULTURE: Culture: NO GROWTH

## 2020-06-07 MED ORDER — HYOSCYAMINE SULFATE 0.125 MG/ML PO SOLN: 0.2500 mg | ORAL | Status: DC | PRN

## 2020-06-07 MED ORDER — HYOSCYAMINE SULFATE 0.125 MG SL SUBL
0.2500 mg | SUBLINGUAL_TABLET | SUBLINGUAL | Status: DC | PRN
  Administered 2020-06-07: 0.25 mg via SUBLINGUAL
  Filled 2020-06-07: qty 2

## 2020-06-07 NOTE — Progress Notes (Signed)
Patient is reporting to have severe abdominal pain, on call MD notified. No new orders at this time. Will continue to round on patient.

## 2020-06-07 NOTE — Progress Notes (Signed)
PROGRESS NOTE    Brandon Castillo  PYP:[id number] DOB: 02/20/1951 DOA: 06/05/2020 PCP: [name], NP   Chief Complaint  Patient presents with  . Abdominal Pain    Brief Narrative:  As per H&P written by Dr. Josephine Cables on 06/05/2020 Brandon Castillo is a 69 y.o. 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 Patient states that he was sleepwalking last night for the first time in his life and today, his brother was worried that he was confused, he complained of abdominal pain and patient states that he only had a small bowel movement yesterday, and was able to pass gas, but he also complained of abdominal pain.  He denies nausea, vomiting, fever, chills.  ED Course:  In the emergency department, he was hemodynamically stable.  Work-up in the ED showed leukocytosis, thrombocytosis, macrocytic anemia, hyperglycemia, transaminitis, lactic acid 3.1.   CT abdomen and pelvis with contrast showed marked wall thickening throughout the transverse colon suspected to be due to ischemia or tumor infiltration.  Large amount of stool in the distal sigmoid colon and rectum compatible with constipation was also noted. Chest x-ray showed no acute cardiopulmonary disease and stable right suprahilar mass Patient was empirically treated with IV cefepime and vancomycin due to presumed intra-abdominal infection, IV hydration was provided.  Hospitalist was asked to admit patient for further evaluation and management.  Assessment &  Plan: 1-generalized abdominal pain -Appears to be secondary to colitis and stool burden/constipation -Patient was chronically started on pain medication -Continue the use of MiraLAX, Senokot and Linzess -Tapwater enema will be also ordered -Follow response.  2-urinary retention/bladder distention -No signs of acute infection currently appreciated -Foley catheter has been placed for decompression -Will keep in place until stool burden/constipation relief and then attempt voiding trial. -Antibiotics for colitis will also help in case that there is any component of colonization/infection.  3-Multiple sclerosis ([facility]) -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 -Appears to be stable.  4-Leukocytosis -In the setting of dehydration/colitis -WBCs trending down and improving.  5-Anxiety, generalized/insomnia -Continue the use of as needed Ativan and trazodone.  6-closed left radial fracture -Stable and with immobilizer in place -Continue as needed analgesics.  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  6-COPD/emphysema -No wheezing or complaining of shortness of breath currently -Continue bronchodilators as needed.  7-Elevated LFTs -In the setting of most likely lung cancer metastasis to the liver -Continue to follow trend -Continue IV fluids. -Will check liver ultrasound if necessary.  8-Lactic acidosis -Resolved with fluid resuscitation.  9-sepsis present on admission -Patient with positive SIRS criteria on presentation and having source of infection colitis as appreciated on his abdominal imaging studies -Will continue the use of Unasyn -Continue supportive care and follow resolution of sepsis features -WBCs improving. -Low-grade temperature overnight appreciated.    DVT prophylaxis: Chronically on Eliquis Code Status: DNR Family Communication: Patient updated over the phone. Disposition:   Status is:  Inpatient  Dispo: The patient is from: Home              Anticipated d/c  is to: Home              Anticipated d/c date is: To be determined.              Patient currently no medically stable for discharge; continue to have significant abdominal pain requiring IV pain medication.  Continue IV antibiotics, continue the use of the water enema, Linzess and MiraLAX.  Follow response.     Consultants:   None   Procedures:  See below for x-ray reports.   Antimicrobials:  Unasyn   Subjective: Patient with low-grade temperature overnight; still having some abdominal discomfort but much improved.  No nausea, no vomiting, reports multiple bowel movements since yesterday night.  Overall feeling better.  He will attempt to have diet today.    Objective: Vitals:   06/06/20 2056 06/07/20 0520 06/07/20 0641 06/07/20 1543  BP: 129/64 138/64  120/64  Pulse: 91 88  79  Resp: 19 17  20   Temp: 98.3 F (36.8 C) (!) 100.4 F (38 C) 98.8 F (37.1 C) 98.2 F (36.8 C)  TempSrc:   Temporal Axillary  SpO2: 97% 96%  96%  Weight:      Height:        Intake/Output Summary (Last 24 hours) at 06/07/2020 1834 Last data filed at 06/07/2020 1734 Gross per 24 hour  Intake 688.49 ml  Output 1550 ml  Net -861.51 ml   Filed Weights   06/05/20 1442  Weight: 56.7 kg    Examination: General exam: Alert, awake and following commands appropriately; patient is blind at baseline.  No chest pain, no nausea, no vomiting.  Still reporting abdominal pain but much improved.  Expressed ability to move his bowels more frequently. Respiratory system: Clear to auscultation. Respiratory effort normal. Cardiovascular system:RRR. No murmurs, rubs, gallops. Gastrointestinal system: Abdomen is nondistended, soft and mild tenderness on palpation appreciated. No organomegaly or masses felt. Normal bowel sounds heard. Central nervous system: Alert and oriented. No focal neurological deficits. Extremities: No cyanosis  or clubbing. Skin: No petechiae. Psychiatry: Judgement and insight appear normal. Mood & affect appropriate.    Data Reviewed: I have personally reviewed following labs and imaging studies  CBC: Recent Labs  Lab 06/02/20 0855 06/05/20 1514 06/06/20 0444  WBC 13.4* 16.4* 12.7*  NEUTROABS  --  14.5*  --   HGB 10.0* 10.9* 9.5*  HCT 32.6* 35.0* 31.9*  MCV 103.8* 104.5* 105.3*  PLT 406* 421* 782    Basic Metabolic Panel: Recent Labs  Lab 06/05/20 1514 06/06/20 0221  NA 139 139  K 4.0 4.0  CL 101 105  CO2 25 25  GLUCOSE 146* 103*  BUN 33* 25*  CREATININE 0.80 0.69  CALCIUM 10.0 8.9  MG  --  2.5*  PHOS  --  2.9    GFR: Estimated Creatinine Clearance: 70.9 mL/min (by C-G formula based on SCr of 0.69 mg/dL).  Liver Function Tests: Recent Labs  Lab 06/05/20 1514 06/06/20 0221  AST 79* 36  ALT 93* 58*  ALKPHOS 291* 210*  BILITOT 0.9 0.8  PROT 7.7 6.0*  ALBUMIN 3.8 2.9*    CBG: No results for input(s): GLUCAP in the last 168 hours.   Recent Results (from the past 240 hour(s))  SARS Coronavirus 2 by RT PCR (hospital order, performed in [facility] hospital lab) Nasopharyngeal Nasopharyngeal Swab     Status: None   Collection Time: 06/02/20  8:29 AM   Specimen: Nasopharyngeal Swab  Result Value Ref Range Status   SARS  Coronavirus 2 NEGATIVE NEGATIVE Final    Comment: (NOTE) SARS-CoV-2 target nucleic acids are NOT DETECTED.  The SARS-CoV-2 RNA is generally detectable in upper and lower respiratory specimens during the acute phase of infection. The lowest concentration of SARS-CoV-2 viral copies this assay can detect is 250 copies / mL. A negative result does not preclude SARS-CoV-2 infection and should not be used as the sole basis for treatment or other patient management decisions.  A negative result may occur with improper specimen collection / handling, submission of specimen other than nasopharyngeal swab, presence of viral mutation(s) within the areas  targeted by this assay, and inadequate number of viral copies (<250 copies / mL). A negative result must be combined with clinical observations, patient history, and epidemiological information.  Fact Sheet for Patients:   StrictlyIdeas.no  Fact Sheet for Healthcare Providers: BankingDealers.co.za  This test is not yet approved or  cleared by the [place] FDA and has been authorized for detection and/or diagnosis of SARS-CoV-2 by FDA under an Emergency Use Authorization (EUA).  This EUA will remain in effect (meaning this test can be used) for the duration of the COVID-19 declaration under Section 564(b)(1) of the Act, 21 U.S.C. section 360bbb-3(b)(1), unless the authorization is terminated or revoked sooner.  Performed at [facility], [place] [address] [place], [place] [address]   Culture, blood (single)     Status: None (Preliminary result)   Collection Time: 06/05/20  3:48 PM   Specimen: BLOOD  Result Value Ref Range Status   Specimen Description BLOOD RIGHT ANTECUBITAL  Final   Special Requests   Final    BOTTLES DRAWN AEROBIC AND ANAEROBIC Blood Culture adequate volume   Culture   Final    NO GROWTH 2 DAYS Performed at [facility], [address], [place], [place] [address]    Report Status PENDING  Incomplete  Resp Panel by RT-PCR (Flu A&B, Covid) Nasopharyngeal Swab     Status: None   Collection Time: 06/05/20  4:15 PM   Specimen: Nasopharyngeal Swab; Nasopharyngeal(NP) swabs in vial transport medium  Result Value Ref Range Status   SARS Coronavirus 2 by RT PCR NEGATIVE NEGATIVE Final    Comment: (NOTE) SARS-CoV-2 target nucleic acids are NOT DETECTED.  The SARS-CoV-2 RNA is generally detectable in upper respiratory specimens during the acute phase of infection. The lowest concentration of SARS-CoV-2 viral copies this assay can detect is 138 copies/mL. A negative result does not preclude SARS-Cov-2 infection  and should not be used as the sole basis for treatment or other patient management decisions. A negative result may occur with  improper specimen collection/handling, submission of specimen other than nasopharyngeal swab, presence of viral mutation(s) within the areas targeted by this assay, and inadequate number of viral copies(<138 copies/mL). A negative result must be combined with clinical observations, patient history, and epidemiological information. The expected result is Negative.  Fact Sheet for Patients:  EntrepreneurPulse.com.au  Fact Sheet for Healthcare Providers:  IncredibleEmployment.be  This test is no t yet approved or cleared by the [place] FDA and  has been authorized for detection and/or diagnosis of SARS-CoV-2 by FDA under an Emergency Use Authorization (EUA). This EUA will remain  in effect (meaning this test can be used) for the duration of the COVID-19 declaration under Section 564(b)(1) of the Act, 21 U.S.C.section 360bbb-3(b)(1), unless the authorization is terminated  or revoked sooner.       Influenza A by PCR NEGATIVE NEGATIVE Final   Influenza B  by PCR NEGATIVE NEGATIVE Final    Comment: (NOTE) The Xpert Xpress SARS-CoV-2/FLU/RSV plus assay is intended as an aid in the diagnosis of influenza from Nasopharyngeal swab specimens and should not be used as a sole basis for treatment. Nasal washings and aspirates are unacceptable for Xpert Xpress SARS-CoV-2/FLU/RSV testing.  Fact Sheet for Patients: EntrepreneurPulse.com.au  Fact Sheet for Healthcare Providers: IncredibleEmployment.be  This test is not yet approved or cleared by the [place] FDA and has been authorized for detection and/or diagnosis of SARS-CoV-2 by FDA under an Emergency Use Authorization (EUA). This EUA will remain in effect (meaning this test can be used) for the duration of the COVID-19 declaration  under Section 564(b)(1) of the Act, 21 U.S.C. section 360bbb-3(b)(1), unless the authorization is terminated or revoked.  Performed at [facility], [address] [place], [place] [address]   Blood culture (routine single)     Status: None (Preliminary result)   Collection Time: 06/05/20  5:22 PM   Specimen: BLOOD  Result Value Ref Range Status   Specimen Description BLOOD RIGHT HAND  Final   Special Requests   Final    BOTTLES DRAWN AEROBIC ONLY Blood Culture adequate volume   Culture   Final    NO GROWTH 2 DAYS Performed at [facility], [address] [place], [place] [address]    Report Status PENDING  Incomplete  Urine culture     Status: None   Collection Time: 06/05/20  6:21 PM   Specimen: In/Out Cath Urine  Result Value Ref Range Status   Specimen Description   Final    IN/OUT CATH URINE Performed at [facility], [address] [place], [place] [address]    Special Requests   Final    NONE Performed at [facility], [address], [place], [place] [address]    Culture   Final    NO GROWTH Performed at [facility], [place] [address], [place], [place] [address]    Report Status 06/07/2020 FINAL  Final     Radiology Studies: No results found.  Scheduled Meds: . apixaban  2.5 mg Oral BID  . Chlorhexidine Gluconate Cloth  6 each Topical Daily  . feeding supplement  237 mL Oral BID BM  . linaclotide  145 mcg Oral QAC breakfast  .  morphine injection  2 mg Intravenous Once  . pantoprazole  40 mg Oral Daily  . polyethylene glycol  17 g Oral Daily  . senna-docusate  2 tablet Oral QHS   Continuous Infusions: . sodium chloride 1,000 mL (06/06/20 1240)  . ampicillin-sulbactam (UNASYN) IV 3 g (06/07/20 1748)     LOS: 2 days    Time spent: 30 minutes    Barton Dubois, MD [facility]   To contact the attending provider between 7A-7P or the covering provider during after hours 7P-7A, please log into the web site [email or web address] and access using  universal [place] password for that web site. If you do not have the password, please call the hospital operator.  06/07/2020, 6:34 PM

## 2020-06-07 NOTE — Plan of Care (Signed)
  Problem: Education: Goal: Knowledge of General Education information will improve Description Including pain rating scale, medication(s)/side effects and non-pharmacologic comfort measures Outcome: Progressing   Problem: Health Behavior/Discharge Planning: Goal: Ability to manage health-related needs will improve Outcome: Progressing   

## 2020-06-08 DIAGNOSIS — F411 Generalized anxiety disorder: Secondary | ICD-10-CM | POA: Diagnosis not present

## 2020-06-08 DIAGNOSIS — J449 Chronic obstructive pulmonary disease, unspecified: Secondary | ICD-10-CM | POA: Diagnosis not present

## 2020-06-08 DIAGNOSIS — S52302D Unspecified fracture of shaft of left radius, subsequent encounter for closed fracture with routine healing: Secondary | ICD-10-CM | POA: Diagnosis not present

## 2020-06-08 DIAGNOSIS — R1084 Generalized abdominal pain: Secondary | ICD-10-CM | POA: Diagnosis not present

## 2020-06-08 MED ORDER — AMOXICILLIN-POT CLAVULANATE 875-125 MG PO TABS
1.0000 | ORAL_TABLET | Freq: Two times a day (BID) | ORAL | Status: AC
  Administered 2020-06-08 – 2020-06-13 (×11): 1 via ORAL
  Filled 2020-06-08 (×11): qty 1

## 2020-06-08 MED ORDER — APIXABAN 5 MG PO TABS
5.0000 mg | ORAL_TABLET | Freq: Two times a day (BID) | ORAL | Status: DC
  Administered 2020-06-08 – 2020-06-09 (×2): 5 mg via ORAL
  Filled 2020-06-08 (×2): qty 1

## 2020-06-08 NOTE — Plan of Care (Signed)
  Problem: Education: Goal: Knowledge of General Education information will improve Description: Including pain rating scale, medication(s)/side effects and non-pharmacologic comfort measures Outcome: Progressing   Problem: Clinical Measurements: Goal: Will remain free from infection Outcome: Progressing Goal: Diagnostic test results will improve Outcome: Progressing   

## 2020-06-08 NOTE — Progress Notes (Signed)
PROGRESS NOTE    Brandon Castillo  OJJ:[id number] DOB: [birth year] DOA: 06/05/2020 PCP: [name], NP   Chief Complaint  Patient presents with  . Abdominal Pain    Brief Narrative:  As per H&P written by Dr. Josephine Cables on 06/05/2020 Brandon Castillo is a 69 y.o. 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 Patient states that he was sleepwalking last night for the first time in his life and today, his brother was worried that he was confused, he complained of abdominal pain and patient states that he only had a small bowel movement yesterday, and was able to pass gas, but he also complained of abdominal pain.  He denies nausea, vomiting, fever, chills.  ED Course:  In the emergency department, he was hemodynamically stable.  Work-up in the ED showed leukocytosis, thrombocytosis, macrocytic anemia, hyperglycemia, transaminitis, lactic acid 3.1.   CT abdomen and pelvis with contrast showed marked wall thickening throughout the transverse colon suspected to be due to ischemia or tumor infiltration.  Large amount of stool in the distal sigmoid colon and rectum compatible with constipation was also noted. Chest x-ray showed no acute cardiopulmonary disease and stable right suprahilar mass Patient was empirically treated with IV cefepime and vancomycin due to presumed intra-abdominal infection, IV hydration was provided.  Hospitalist was asked to admit patient for further evaluation and management.  Assessment &  Plan: 1-generalized abdominal pain -Appears to be secondary to colitis and stool burden/constipation -Patient was chronically started on pain medication -Continue the use of MiraLAX and Linzess -Transition medications and antibiotics to oral route, assess physical capability with ambulation by PT and follow response.  2-urinary retention/bladder distention -No signs of acute infection currently appreciated -Patient denies dysuria. -Foley catheter has been placed for decompression. -With high concern for neurogenic bladder; patient would like to keep Foley catheter in place until outpatient follow-up with urology service. -Antibiotics for colitis will also help in case that there is any component of colonization/infection.  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 -Appears to be stable overall.  4-Leukocytosis -In the setting of dehydration/colitis -WBCs trending down appropriately; repeat CBC in a.m.  5-Anxiety, generalized/insomnia -Continue the use of as needed Ativan and trazodone.  6-closed left radial fracture -Stable and with immobilizer in place -Continue as needed analgesics.  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  6-COPD/emphysema -No wheezing or complaining of shortness of breath currently -Continue bronchodilators as needed.  7-Elevated LFTs -In the setting of most likely lung cancer metastasis to the liver -Continue to follow trend -Continue to maintain adequate hydration orally. -Liver ultrasound to be pursued as an outpatient on the oncology service discretion.Marland Kitchen  8-Lactic acidosis -Resolved with fluid resuscitation.  9-sepsis present on admission -Patient with positive SIRS criteria on presentation and having source of infection colitis as appreciated on his abdominal imaging studies -Will continue antibiotics, but  will transition to oral route using Augmentin. -Continue supportive care and follow resolution of sepsis features -Repeat CBC in a.m. -Patient currently  afebrile and denies nausea and vomiting.    DVT prophylaxis: Chronically on Eliquis Code Status: DNR Family Communication: Patient updated over the phone. Disposition:   Status is: Inpatient  Dispo: The patient is from: Home              Anticipated d/c is to: Home              Anticipated d/c date is: 06/09/20 (if found stable to go home with home health services).              Patient currently no medically stable for discharge; feeling significantly weak and deconditioned; still having ongoing abdominal pain requiring IV pain medication.  Will transition antibiotics to oral route, continue diet and adjust bowel regimen medications.  Physical therapy will be requested to see patient in order to determine safest discharge pathway.     Consultants:   None   Procedures:  See below for x-ray reports.   Antimicrobials:  Unasyn   Subjective: No fever appreciated; still having some intermittent abdominal discomfort, but much improved.  Reports feeling weak and deconditioned.  Patient denies nausea and vomiting.  Objective: Vitals:   06/07/20 1543 06/07/20 1951 06/07/20 2039 06/08/20 0349  BP: 120/64  128/71 (!) 146/72  Pulse: 79  87 80  Resp: 20  19 18   Temp: 98.2 F (36.8 C)  98.6 F (37 C) 98 F (36.7 C)  TempSrc: Axillary     SpO2: 96% 94% 98% 95%  Weight:      Height:        Intake/Output Summary (Last 24 hours) at 06/08/2020 1014 Last data filed at 06/08/2020 0500 Gross per 24 hour  Intake --  Output 1600 ml  Net -1600 ml   Filed Weights   06/05/20 1442  Weight: 56.7 kg    Examination: General exam: Alert, awake, oriented x 3; blind at baseline; no chest pain, no shortness of breath.  Expressed to feeling weak and deconditioned.  Still having intermittent abdominal discomfort but having able to tolerate  some diet.  No nausea or vomiting.  Multiple bowel movements overnight. Respiratory system: Clear to auscultation. Respiratory effort normal. Cardiovascular system:RRR. No murmurs, rubs, gallops.  No JVD. Gastrointestinal system: Abdomen is nondistended, soft and mild tenderness to palpation.  No guarding.  Soft on examination and with present bowel sounds.   Central nervous system: Alert and oriented. No focal neurological deficits. Extremities: No cyanosis or clubbing. Skin: No petechiae. Psychiatry: Judgement and insight appear normal. Mood & affect appropriate.    Data Reviewed: I have personally reviewed following labs and imaging studies  CBC: Recent Labs  Lab 06/02/20 0855 06/05/20 1514 06/06/20 0444  WBC 13.4* 16.4* 12.7*  NEUTROABS  --  14.5*  --   HGB 10.0* 10.9* 9.5*  HCT 32.6* 35.0* 31.9*  MCV 103.8* 104.5* 105.3*  PLT 406* 421* 737    Basic Metabolic Panel: Recent Labs  Lab 06/05/20 1514 06/06/20 0221  NA 139 139  K 4.0 4.0  CL 101 105  CO2 25 25  GLUCOSE 146* 103*  BUN 33* 25*  CREATININE 0.80 0.69  CALCIUM 10.0 8.9  MG  --  2.5*  PHOS  --  2.9    GFR: Estimated Creatinine Clearance: 70.9 mL/min (by C-G formula based on SCr of 0.69 mg/dL).  Liver Function Tests: Recent Labs  Lab 06/05/20 1514 06/06/20 0221  AST 79* 36  ALT 93* 58*  ALKPHOS 291* 210*  BILITOT 0.9 0.8  PROT 7.7  6.0*  ALBUMIN 3.8 2.9*    CBG: No results for input(s): GLUCAP in the last 168 hours.   Recent Results (from the past 240 hour(s))  SARS Coronavirus 2 by RT PCR (hospital order, performed in [facility] hospital lab) Nasopharyngeal Nasopharyngeal Swab     Status: None   Collection Time: 06/02/20  8:29 AM   Specimen: Nasopharyngeal Swab  Result Value Ref Range Status   SARS Coronavirus 2 NEGATIVE NEGATIVE Final    Comment: (NOTE) SARS-CoV-2 target nucleic acids are NOT DETECTED.  The SARS-CoV-2 RNA is generally detectable in upper and lower respiratory  specimens during the acute phase of infection. The lowest concentration of SARS-CoV-2 viral copies this assay can detect is 250 copies / mL. A negative result does not preclude SARS-CoV-2 infection and should not be used as the sole basis for treatment or other patient management decisions.  A negative result may occur with improper specimen collection / handling, submission of specimen other than nasopharyngeal swab, presence of viral mutation(s) within the areas targeted by this assay, and inadequate number of viral copies (<250 copies / mL). A negative result must be combined with clinical observations, patient history, and epidemiological information.  Fact Sheet for Patients:   StrictlyIdeas.no  Fact Sheet for Healthcare Providers: BankingDealers.co.za  This test is not yet approved or  cleared by the [place] FDA and has been authorized for detection and/or diagnosis of SARS-CoV-2 by FDA under an Emergency Use Authorization (EUA).  This EUA will remain in effect (meaning this test can be used) for the duration of the COVID-19 declaration under Section 564(b)(1) of the Act, 21 U.S.C. section 360bbb-3(b)(1), unless the authorization is terminated or revoked sooner.  Performed at [facility], [place] [address], [place], [place] [address]   Culture, blood (single)     Status: None (Preliminary result)   Collection Time: 06/05/20  3:48 PM   Specimen: BLOOD  Result Value Ref Range Status   Specimen Description BLOOD RIGHT ANTECUBITAL  Final   Special Requests   Final    BOTTLES DRAWN AEROBIC AND ANAEROBIC Blood Culture adequate volume   Culture   Final    NO GROWTH 3 DAYS Performed at [facility], [address] [place],  [address]    Report Status PENDING  Incomplete  Resp Panel by RT-PCR (Flu A&B, Covid) Nasopharyngeal Swab     Status: None   Collection Time: 06/05/20  4:15 PM   Specimen: Nasopharyngeal Swab;  Nasopharyngeal(NP) swabs in vial transport medium  Result Value Ref Range Status   SARS Coronavirus 2 by RT PCR NEGATIVE NEGATIVE Final    Comment: (NOTE) SARS-CoV-2 target nucleic acids are NOT DETECTED.  The SARS-CoV-2 RNA is generally detectable in upper respiratory specimens during the acute phase of infection. The lowest concentration of SARS-CoV-2 viral copies this assay can detect is 138 copies/mL. A negative result does not preclude SARS-Cov-2 infection and should not be used as the sole basis for treatment or other patient management decisions. A negative result may occur with  improper specimen collection/handling, submission of specimen other than nasopharyngeal swab, presence of viral mutation(s) within the areas targeted by this assay, and inadequate number of viral copies(<138 copies/mL). A negative result must be combined with clinical observations, patient history, and epidemiological information. The expected result is Negative.  Fact Sheet for Patients:  EntrepreneurPulse.com.au  Fact Sheet for Healthcare Providers:  IncredibleEmployment.be  This test is no t yet approved or cleared by the [place] FDA  and  has been authorized for detection and/or diagnosis of SARS-CoV-2 by FDA under an Emergency Use Authorization (EUA). This EUA will remain  in effect (meaning this test can be used) for the duration of the COVID-19 declaration under Section 564(b)(1) of the Act, 21 U.S.C.section 360bbb-3(b)(1), unless the authorization is terminated  or revoked sooner.       Influenza A by PCR NEGATIVE NEGATIVE Final   Influenza B by PCR NEGATIVE NEGATIVE Final    Comment: (NOTE) The Xpert Xpress SARS-CoV-2/FLU/RSV plus assay is intended as an aid in the diagnosis of influenza from Nasopharyngeal swab specimens and should not be used as a sole basis for treatment. Nasal washings and aspirates are unacceptable for Xpert Xpress  SARS-CoV-2/FLU/RSV testing.  Fact Sheet for Patients: EntrepreneurPulse.com.au  Fact Sheet for Healthcare Providers: IncredibleEmployment.be  This test is not yet approved or cleared by the [place] FDA and has been authorized for detection and/or diagnosis of SARS-CoV-2 by FDA under an Emergency Use Authorization (EUA). This EUA will remain in effect (meaning this test can be used) for the duration of the COVID-19 declaration under Section 564(b)(1) of the Act, 21 U.S.C. section 360bbb-3(b)(1), unless the authorization is terminated or revoked.  Performed at [facility], [address], [place], [place] [address]   Blood culture (routine single)     Status: None (Preliminary result)   Collection Time: 06/05/20  5:22 PM   Specimen: BLOOD  Result Value Ref Range Status   Specimen Description BLOOD RIGHT HAND  Final   Special Requests   Final    BOTTLES DRAWN AEROBIC ONLY Blood Culture adequate volume   Culture   Final    NO GROWTH 3 DAYS Performed at [facility], [address], [place], [place] [address]    Report Status PENDING  Incomplete  Urine culture     Status: None   Collection Time: 06/05/20  6:21 PM   Specimen: In/Out Cath Urine  Result Value Ref Range Status   Specimen Description   Final    IN/OUT CATH URINE Performed at [facility], [address], [place], [place] [address]    Special Requests   Final    NONE Performed at [facility], [address], [place], [place] [address]    Culture   Final    NO GROWTH Performed at [facility], [place] [address], [place], [place] [address]    Report Status 06/07/2020 FINAL  Final     Radiology Studies: No results found.  Scheduled Meds: . amoxicillin-clavulanate  1 tablet Oral Q12H  . apixaban  2.5 mg Oral BID  . Chlorhexidine Gluconate Cloth  6 each Topical Daily  . feeding supplement  237 mL Oral BID BM  . linaclotide  145 mcg Oral QAC breakfast  .  morphine  injection  2 mg Intravenous Once  . pantoprazole  40 mg Oral Daily  . polyethylene glycol  17 g Oral Daily   Continuous Infusions: . sodium chloride 1,000 mL (06/06/20 1240)     LOS: 3 days    Time spent: 30 minutes    Barton Dubois, MD [facility]   To contact the attending provider between 7A-7P or the covering provider during after hours 7P-7A, please log into the web site [email or web address] and access using universal [place] password for that web site. If you do not have the password, please call the hospital operator.  06/08/2020, 10:14 AM

## 2020-06-09 DIAGNOSIS — S52302D Unspecified fracture of shaft of left radius, subsequent encounter for closed fracture with routine healing: Secondary | ICD-10-CM | POA: Diagnosis not present

## 2020-06-09 DIAGNOSIS — C3491 Malignant neoplasm of unspecified part of right bronchus or lung: Secondary | ICD-10-CM | POA: Diagnosis not present

## 2020-06-09 DIAGNOSIS — K5641 Fecal impaction: Secondary | ICD-10-CM

## 2020-06-09 DIAGNOSIS — F411 Generalized anxiety disorder: Secondary | ICD-10-CM | POA: Diagnosis not present

## 2020-06-09 DIAGNOSIS — R1084 Generalized abdominal pain: Secondary | ICD-10-CM | POA: Diagnosis not present

## 2020-06-09 DIAGNOSIS — J449 Chronic obstructive pulmonary disease, unspecified: Secondary | ICD-10-CM | POA: Diagnosis not present

## 2020-06-09 LAB — BASIC METABOLIC PANEL
Anion gap: 10 (ref 5–15)
BUN: 18 mg/dL (ref 8–23)
CO2: 25 mmol/L (ref 22–32)
Calcium: 9 mg/dL (ref 8.9–10.3)
Chloride: 99 mmol/L (ref 98–111)
Creatinine, Ser: 0.89 mg/dL (ref 0.61–1.24)
GFR, Estimated: >60 mL/min (ref >60)
Glucose, Bld: 103 mg/dL — ABNORMAL HIGH (ref 70–99)
Potassium: 3.8 mmol/L (ref 3.5–5.1)
Sodium: 134 mmol/L — ABNORMAL LOW (ref 135–145)

## 2020-06-09 LAB — CBC
HCT: 29.9 % — ABNORMAL LOW (ref 39.0–52.0)
Hemoglobin: 9.3 g/dL — ABNORMAL LOW (ref 13.0–17.0)
MCH: 31.4 pg (ref 26.0–34.0)
MCHC: 31.1 g/dL (ref 30.0–36.0)
MCV: 101 fL — ABNORMAL HIGH (ref 80.0–100.0)
Platelets: 397 10*3/uL (ref 150–400)
RBC: 2.96 MIL/uL — ABNORMAL LOW (ref 4.22–5.81)
RDW: 12.7 % (ref 11.5–15.5)
WBC: 11.2 10*3/uL — ABNORMAL HIGH (ref 4.0–10.5)
nRBC: 0 % (ref 0.0–0.2)

## 2020-06-09 LAB — MAGNESIUM: Magnesium: 2 mg/dL (ref 1.7–2.4)

## 2020-06-09 MED ORDER — APIXABAN 2.5 MG PO TABS
2.5000 mg | ORAL_TABLET | Freq: Two times a day (BID) | ORAL | Status: DC
  Administered 2020-06-09 – 2020-06-16 (×15): 2.5 mg via ORAL
  Filled 2020-06-09 (×16): qty 1

## 2020-06-09 MED ORDER — LORAZEPAM 1 MG PO TABS
1.0000 mg | ORAL_TABLET | Freq: Every evening | ORAL | Status: DC | PRN
  Administered 2020-06-16: 1 mg via ORAL
  Filled 2020-06-09: qty 1

## 2020-06-09 MED ORDER — TRAZODONE HCL 50 MG PO TABS
50.0000 mg | ORAL_TABLET | Freq: Every evening | ORAL | Status: DC | PRN
  Administered 2020-06-13 – 2020-06-17 (×5): 50 mg via ORAL
  Filled 2020-06-09 (×5): qty 1

## 2020-06-09 NOTE — Consult Note (Signed)
Consultation Note Date: 06/09/2020   Patient Name: Brandon Castillo  DOB: 1951/02/21  MRN: [id number]  Age / Sex: 69 y.o., male  PCP: [name], NP Referring Physician: Barton Dubois, MD  Reason for Consultation: Establishing goals of care  HPI/Patient Profile: Brandon Castillo is a 69 y.o. 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  CT of the abdomen revealed bilateral renal masses, liver nodule, bilateral adrenal nodules.  CT of the chest revealed a right upper lobe mass suspicious for primary lung neoplasm.  Patient underwent liver biopsy with pathology revealing squamous cell carcinoma.  Patient was discharged home on 11/5 with plan for outpatient oncology follow-up.  Unfortunately, after returning home, he fell down several steps and was readmitted to the hospital with fractures to the left hip and left wrist.  Patient readmitted now with abdominal pain and workup revealing gastritis secondary to constipation vs infection. Palliative medicine consulted for goals of care. Patient has been seen by Palliative on 11/4, and 11/6. He was referred for outpatient Palliative followup- but likely has not had time at home to be seen.   Clinical Assessment and Goals of Care: Evaluated patient and met at bedside with him and his brother.  He is feeling well, enjoying his lunch. Had difficulty with weakness during PT evaluation, he is hopeful to be re-evaluated after he has been able to eat.  I reviewed Palliative care with patient and brother. They remember speaking with my partners- Ihor Dow, NP and Billey Chang, NP on previous admission.  He continues to have goals of care for aggressive treatment, he does understand that his cancer is life limiting,  and any treatment is to slow the progression. He currently has a DO NOT RESUSCITATE order in place.  I offered advanced directive packet, and to discuss living will.  Patient's brother states that he has [facility] POA paperwork in place.  I requested a copy for patient's chart.  Attempted to discuss patient's feelings regarding going to rehab for physical therapy, however patient declined to discuss this pending a follow-up physical therapy evaluation. He feels he is improving from this admission, he is eating well, he is hopeful for discharge home and to continue with his care.  Primary Decision Maker PATIENT    SUMMARY OF RECOMMENDATIONS -Continue current level of care -Patient hopeful for d/c home- has been referred on previous admission for outpatient Palliative -MOST/AD completion declined- Living Will left at bedside for patient's brother to review with patient    Code Status/Advance Care Planning:  DNR   Prognosis:    Unable to determine  Discharge Planning: Home with Home Health  Primary Diagnoses: Present on Admission: . Constipation . Generalized abdominal pain . Multiple sclerosis ([place]) . 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 COPD (chronic obstructive pulmonary disease) ([place]) . Leukocytosis . Elevated LFTs . Stage IV squamous cell carcinoma of Right Lung  Cancer/// with Metastasis from Rt Lung to other sites (Pleural/Chest/Abdomen/Liver)/Non-Small Cell Carcinoma)  . Anxiety, generalized   I have reviewed the medical record, interviewed the patient and family, and examined the patient. The following aspects are pertinent.  Past Medical History:  Diagnosis Date  . Cancer [facility])    Diagnosed yesterday  . Multiple sclerosis ([place])    Social History   Socioeconomic History  . Marital status: Single    Spouse name: Not on file  . Number of children: Not on file  . Years of education: Not on file  . Highest education level: Not on file  Occupational History  . Not on file   Tobacco Use  . Smoking status: Never Smoker  . Smokeless tobacco: Never Used  Vaping Use  . Vaping Use: Never used  Substance and Sexual Activity  . Alcohol use: Not Currently  . Drug use: Not Currently  . Sexual activity: Not on file  Other Topics Concern  . Not on file  Social History Narrative  . Not on file   Social Determinants of Health   Financial Resource Strain:   . Difficulty of Paying Living Expenses: Not on file  Food Insecurity:   . Worried About Charity fundraiser in the Last Year: Not on file  . Ran Out of Food in the Last Year: Not on file  Transportation Needs:   . Lack of Transportation (Medical): Not on file  . Lack of Transportation (Non-Medical): Not on file  Physical Activity:   . Days of Exercise per Week: Not on file  . Minutes of Exercise per Session: Not on file  Stress:   . Feeling of Stress : Not on file  Social Connections:   . Frequency of Communication with Friends and Family: Not on file  . Frequency of Social Gatherings with Friends and Family: Not on file  . Attends Religious Services: Not on file  . Active Member of Clubs or Organizations: Not on file  . Attends Archivist Meetings: Not on file  . Marital Status: Not on file   Scheduled Meds: . amoxicillin-clavulanate  1 tablet Oral Q12H  . apixaban  2.5 mg Oral BID  . Chlorhexidine Gluconate Cloth  6 each Topical Daily  . feeding supplement  237 mL Oral BID BM  . linaclotide  145 mcg Oral QAC breakfast  .  morphine injection  2 mg Intravenous Once  . pantoprazole  40 mg Oral Daily  . polyethylene glycol  17 g Oral Daily   Continuous Infusions: . sodium chloride 1,000 mL (06/06/20 1240)   PRN Meds:.sodium chloride, ipratropium-albuterol, LORazepam, morphine injection, ondansetron (ZOFRAN) IV, traZODone Medications Prior to Admission:  Prior to Admission medications   Medication Sig Start Date End Date Taking? Authorizing Provider  feeding supplement (ENSURE ENLIVE /  ENSURE PLUS) LIQD Take 237 mLs by mouth 3 (three) times daily. Patient taking differently: Take 237 mLs by mouth 2 (two) times daily between meals.  05/16/20  Yes Roxan Hockey, MD  ferrous sulfate 325 (65 FE) MG tablet Take 1 tablet (325 mg total) by mouth 2 (two) times daily with a meal. 05/20/20 05/20/21 Yes Nita Sells, MD  HYDROcodone-acetaminophen (NORCO) 10-325 MG tablet Take 1 tablet by mouth every 6 (six) hours as needed. 06/02/20  Yes Avanell Shackleton III, MD  LORazepam (ATIVAN) 1 MG tablet Take 1 tablet (1 mg total) by mouth every 8 (eight) hours as needed for anxiety. Patient taking differently: Take 1 mg by mouth  at bedtime as needed for sleep.  05/16/20  Yes Emokpae, Courage, MD  methocarbamol (ROBAXIN) 500 MG tablet Take 1 tablet (500 mg total) by mouth 4 (four) times daily. Patient taking differently: Take 500 mg by mouth daily as needed for muscle spasms.  05/20/20  Yes Nita Sells, MD  Multiple Vitamins-Minerals (MULTIVITAMIN WITH MINERALS) tablet Take 1 tablet by mouth daily.   Yes [provider]  multivitamin-lutein (OCUVITE-LUTEIN) CAPS capsule Take 1 capsule by mouth daily. 05/17/20  Yes Emokpae, Courage, MD  pantoprazole (PROTONIX) 40 MG tablet Take 1 tablet (40 mg total) by mouth daily. Patient taking differently: Take 40 mg by mouth daily as needed (reflux).  05/17/20  Yes Emokpae, Courage, MD  polyethylene glycol (MIRALAX / GLYCOLAX) 17 g packet Take 17 g by mouth daily. Patient taking differently: Take 17 g by mouth daily as needed for mild constipation.  05/17/20  Yes Emokpae, Courage, MD  senna-docusate (SENOKOT-S) 8.6-50 MG tablet Take 2 tablets by mouth at bedtime. Patient taking differently: Take 2 tablets by mouth at bedtime as needed for mild constipation.  05/16/20 05/16/21 Yes Roxan Hockey, MD  traZODone (DESYREL) 100 MG tablet Take 0.5-1 tablets (50-100 mg total) by mouth at bedtime. Patient taking differently: Take 50-100 mg by mouth  at bedtime as needed for sleep.  05/16/20  Yes Emokpae, Courage, MD  apixaban (ELIQUIS) 2.5 MG TABS tablet Take 1 tablet (2.5 mg total) by mouth 2 (two) times daily. 05/20/20   Nita Sells, MD  ondansetron (ZOFRAN) 4 MG tablet Take 1 tablet (4 mg total) by mouth every 6 (six) hours as needed for nausea. 05/16/20   Roxan Hockey, MD  oxyCODONE 10 MG TABS Take 1 tablet (10 mg total) by mouth every 4 (four) hours as needed for moderate pain. Patient not taking: Reported on 05/30/2020 05/20/20   Nita Sells, MD   Allergies  Allergen Reactions  . Demerol [Meperidine Hcl] Other (See Comments)    Had trouble waking up   Review of Systems  Constitutional: Positive for activity change. Negative for appetite change.  Gastrointestinal: Positive for diarrhea.    Physical Exam Vitals and nursing note reviewed.  Constitutional:      Comments: frail  Skin:    General: Skin is warm and dry.  Neurological:     Mental Status: He is alert.     Comments: blind  Psychiatric:        Mood and Affect: Mood normal.        Behavior: Behavior normal.     Vital Signs: BP (!) 141/78 (BP Location: Right Arm)   Pulse 90   Temp 97.7 F (36.5 C) (Oral)   Resp 20   Ht 5' 9"  (1.753 m)   Wt 56.7 kg   SpO2 96%   BMI 18.46 kg/m  Pain Scale: 0-10   Pain Score: 10-Worst pain ever   SpO2: SpO2: 96 % O2 Device:SpO2: 96 % O2 Flow Rate: .   IO: Intake/output summary:   Intake/Output Summary (Last 24 hours) at 06/09/2020 1407 Last data filed at 06/09/2020 0500 Gross per 24 hour  Intake 120 ml  Output 800 ml  Net -680 ml    LBM: Last BM Date: 06/08/20 Baseline Weight: Weight: 56.7 kg Most recent weight: Weight: 56.7 kg     Palliative Assessment/Data: PPS: 30%     Thank you for this consult. Palliative medicine will continue to follow and assist as needed.   Time In: 0930 Time Out: 1418 Time Total: 52 minutes  Greater than 50%  of this time was spent counseling and  coordinating care related to the above assessment and plan.  Signed by: Mariana Kaufman, AGNP-C Palliative Medicine    Please contact Palliative Medicine Team phone at [phone] for questions and concerns.  For individual provider: See Shea Evans

## 2020-06-09 NOTE — Evaluation (Signed)
Physical Therapy Evaluation Patient Details Name: Brandon Castillo MRN: [id number] DOB: 1950/09/13 Today's Date: 06/09/2020   History of Present Illness  Brandon Castillo is a 69 y.o. 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  He denies nausea, vomiting, fever, chills.    Clinical Impression  Pt admitted with above diagnosis. Patient's brother present throughout session. Patient's activity level limited today due to pain and bowel movement upon standing from bed. Patient unsteady upon standing requiring assistance to avoid a fall. Patient blind and in an unfamiliar environment so will require verbal cues for location and direction of all activities. Patient required assistance for all mobility today. Brother, nursing and palliative care in room when therapist left room. Patient currently with functional limitations due to the deficits listed below (see PT Problem List). Pt will benefit from skilled PT to increase their independence and safety with mobility to allow discharge to the venue listed below.       Follow Up Recommendations Home health PT;SNF;Supervision for mobility/OOB;Supervision/Assistance - 24 hour     Equipment Recommendations  None recommended by PT    Recommendations for Other Services       Precautions / Restrictions Restrictions Weight Bearing Restrictions: Yes LUE Weight Bearing: Non weight bearing LLE Weight Bearing: Weight bearing as tolerated      Mobility  Bed Mobility Overal bed mobility: Needs Assistance Bed Mobility: Supine to Sit;Sit to Supine     Supine to sit: Min guard;HOB elevated Sit to supine: Min assist;HOB elevated   General bed mobility comments: unfamiliar environment; requires cues for orientation in bed and assistance of left lower extremity    Transfers Overall transfer level: Needs assistance Equipment used: 1 person hand held assist Transfers: Sit to/from [facility] Sit to Stand: Min assist;Min guard Stand pivot transfers: Min assist;Min guard       General transfer comment: unfamiliar environment, requires cues for orientation and directionality, unsteady upon standing using therapists left elbow to steady plus therapist assist on gait belt  Ambulation/Gait    General Gait Details: limited today as patient began having a bowel movement upon standing, nursing entered room as well as palliative care. Will continue to assess on subsequent visits  Stairs     Wheelchair Mobility    Modified Rankin (Stroke Patients Only)       Balance Overall balance assessment: Needs assistance Sitting-balance support: Feet supported;No upper extremity supported Sitting balance-Leahy Scale: Good     Standing balance support: Single extremity supported;During functional activity Standing balance-Leahy Scale: Poor Standing balance comment: poor with holding on to therapist's elbow; this is how he was walking with his brother prior to admission         Pertinent Vitals/Pain Pain Assessment: 0-10 Pain Score: 9  Pain Location: abdomen Pain Intervention(s): Limited  activity within patient's tolerance;Monitored during session     [place] expects to be discharged to:: Private residence Living Arrangements: Other relatives (brother and sister in law) Available Help at Discharge: Family;Available 24 hours/day Type of Home: House Home Access: Stairs to enter   [employer] of Steps: 2 Home Layout: Multi-level;Able to live on main level with bedroom/bathroom Home Equipment: Walker - 4 wheels;Cane - single point;Other (comment) (sight cane)      Prior Function Level of Independence: Needs assistance   Gait / Transfers Assistance Needed: SPC and Rollator PRN otherwise cane for the blind  ADL's / Homemaking Assistance Needed: own bathing/dressing but assist for IADL  Comments: Permanently moved in with brother at discharge     Hand Dominance   Dominant Hand: Left    Extremity/Trunk Assessment   Upper Extremity Assessment Upper Extremity Assessment: Generalized weakness;LUE deficits/detail LUE Deficits / Details: immobilized from below elbow down; shoulder ROM, elbow, and fingers WFL    Lower Extremity Assessment Lower Extremity Assessment: Generalized weakness LLE Deficits / Details: pt actively moving at hip, knee and ankle, weakness as anticipated from surgery LLE Sensation: WNL LLE Coordination: decreased gross motor    Cervical / Trunk Assessment Cervical / Trunk Assessment: Normal;Other exceptions Cervical / Trunk Exceptions: Pt reports that his abomen is tender from where the cancer is - be mindful with gait belt  Communication   Communication: No difficulties  Cognition Arousal/Alertness: Awake/alert Behavior During Therapy: WFL for tasks assessed/performed Overall Cognitive Status: Within Functional Limits for tasks assessed    General Comments: Pleasant and motivated. Talkative, easily redirected      General Comments      Exercises     Assessment/Plan    PT Assessment Patient needs continued PT services  PT Problem List Decreased  strength;Decreased mobility;Decreased activity tolerance;Decreased balance;Pain       PT Treatment Interventions DME instruction;Therapeutic exercise;Gait training;Balance training;Stair training;Functional mobility training;Therapeutic activities;Patient/family education    PT Goals (Current goals can be found in the Care Plan section)  Acute Rehab PT Goals Patient Stated Goal: Return to brother's home. PT Goal Formulation: With patient/family Time For Goal Achievement: 06/23/20 Potential to Achieve Goals: Fair    Frequency Min 3X/week   Barriers to discharge           AM-PAC PT "6 Clicks" Mobility  Outcome Measure Help needed turning from your back to your side while in a flat bed without using bedrails?: A Little Help needed moving from lying on your back to sitting on the side of a flat bed without using bedrails?: A Little Help needed moving to and from a bed to a chair (including a wheelchair)?: A Lot Help needed standing up from a chair using your arms (e.g., wheelchair or bedside chair)?: A Little Help needed to walk in hospital room?: A Lot Help needed climbing 3-5 steps with a railing? : A Lot 6 Click Score: 15    End of Session Equipment Utilized During Treatment: Gait belt Activity Tolerance: Patient tolerated treatment well;Patient limited by pain;Other (comment) (patient limited by bowel movement) Patient left: in bed;with family/visitor present;with nursing/sitter in room;with call bell/phone within reach Nurse Communication: Mobility status PT Visit Diagnosis: History of falling (Z91.81);Unsteadiness on feet (R26.81);Other abnormalities of gait and mobility (R26.89);Muscle weakness (generalized) (M62.81)    Time: 1030-1100 PT Time Calculation (min) (ACUTE ONLY): 30 min   Charges:   PT Evaluation $PT Eval Moderate Complexity: 1 Mod PT Treatments $Therapeutic Activity: 8-22 mins  Floria Raveling. Hartnett-Rands, MS, PT Per [place] [phone] 06/09/2020, 11:05 AM

## 2020-06-09 NOTE — TOC Progression Note (Addendum)
Transition of Care [facility]) - Progression Note    Patient Details  Name: NYZIER BOIVIN MRN: [id number] Date of Birth: 1950/08/11  Transition of Care [facility]) CM/SW Contact  Boneta Lucks, RN Phone Number: 06/09/2020, 2:04 PM  Clinical Narrative:   Patient admitted with generalized abdominal pain. PT is recommending Home Health. Patient is active with [place] for RN/PT/OT.TOC to follow for orders at discharged.   Palliative consult again with patient, recommending out patient palliative. TOC confirmed with Colletta [place] at [facility]. Patient was admitted for palliative services on 11/16. They will follow at discharge.  Expected Discharge Plan: [place] Barriers to Discharge: Continued Medical Work up  Expected Discharge Plan and Services Expected Discharge Plan: [place] arrangements for the past 2 months: [place]

## 2020-06-09 NOTE — Progress Notes (Signed)
PROGRESS NOTE    Brandon Castillo  ONG:[id number] DOB: 01-04-1951 DOA: 06/05/2020 PCP: [name], NP   Chief Complaint  Patient presents with  . Abdominal Pain    Brief Narrative:  As per H&P written by Dr. Josephine Cables on 06/05/2020 Brandon Castillo is a 69 y.o. 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 Patient states that he was sleepwalking last night for the first time in his life and today, his brother was worried that he was confused, he complained of abdominal pain and patient states that he only had a small bowel movement yesterday, and was able to pass gas, but he also complained of abdominal pain.  He denies nausea, vomiting, fever, chills.  ED Course:  In the emergency department, he was hemodynamically stable.  Work-up in the ED showed leukocytosis, thrombocytosis, macrocytic anemia, hyperglycemia, transaminitis, lactic acid 3.1.   CT abdomen and pelvis with contrast showed marked wall thickening throughout the transverse colon suspected to be due to ischemia or tumor infiltration.  Large amount of stool in the distal sigmoid colon and rectum compatible with constipation was also noted. Chest x-ray showed no acute cardiopulmonary disease and stable right suprahilar mass Patient was empirically treated with IV cefepime and vancomycin due to presumed intra-abdominal infection, IV hydration was provided.  Hospitalist was asked to admit patient for further evaluation and management.  Assessment &  Plan: 1-generalized abdominal pain -Appears to be secondary to colitis and stool burden/constipation -Patient was chronically started on pain medication -Continue the use of MiraLAX and Linzess -Tolerating Augmentin.  2-urinary retention/bladder distention -No signs of acute infection currently appreciated -Patient denies dysuria. -Foley catheter has been placed for decompression. -With high concern for neurogenic bladder; patient would like to keep Foley catheter with initial plans to keep in place until follow-up with urology service as an outpatient; patient would like to attempt voiding trial to facilitate his care. -Antibiotics for colitis will also help in case that there is any component of colonization/infection.  3-Multiple sclerosis ([place]) -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 -With concerns for neurogenic bladder due to MS. -Appears to be stable overall.  4-Leukocytosis -In the setting of dehydration/colitis -WBCs trending down appropriately; repeat CBC in a.m.  5-Anxiety, generalized/insomnia -Continue the use of as needed Ativan and trazodone.  6-closed left radial fracture -Stable and with immobilizer in place -Continue as needed analgesics.  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  6-COPD/emphysema -No wheezing or complaining of shortness of breath currently -Continue bronchodilators as needed.  7-Elevated LFTs -In the setting of most likely lung cancer metastasis to the liver -Continue to follow trend -Continue to maintain adequate hydration orally. -Liver ultrasound to be pursued as an outpatient by the oncology service discretion.[place] Kitchen  8-Lactic acidosis -Resolved with fluid resuscitation.  9-sepsis present on admission -Patient with positive SIRS criteria on presentation and having source of infection colitis as appreciated on his abdominal imaging studies -Will continue antibiotics, but  will transition to oral route using Augmentin. -Continue supportive care and follow resolution of sepsis features -Repeat CBC in  a.m. -Patient currently afebrile and denies nausea and vomiting.    DVT prophylaxis: Chronically on Eliquis Code Status: DNR Family Communication: Patient updated over the phone. Disposition:   Status is: Inpatient  Dispo: The patient is from: Home              Anticipated d/c is to: Home              Anticipated d/c date is: 06/10/20 (if found stable to go home with home health services).              Patient currently no medically stable for discharge; feeling significantly weak and deconditioned; still having ongoing abdominal pain requiring IV pain medication.  Patient has tolerated the rest of his medication by mouth without problems.  Physical therapy recommending home health with 24/7 assistance versus skilled nursing facility.  Will attempt Foley removal.   Consultants:   None   Procedures:  See below for x-ray reports.   Antimicrobials:  Unasyn   Subjective: Reports some improvement in the bowel movement frequency; still having some abdominal pain.  No nausea, no vomiting, no chest pain or shortness of breath.  Feeling weak and tired.  Will like to attempt voiding trials and Foley removal.  Objective: Vitals:   06/08/20 2011 06/08/20 2034 06/09/20 0456 06/09/20 1520  BP:  (!) 152/78 (!) 141/78 (!) 146/74  Pulse:  81 90 79  Resp:  20 20 18   Temp:  98.6 F (37 C) 97.7 F (36.5 C) 97.6 F (36.4 C)  TempSrc:  Oral Oral Oral  SpO2: 94% 96% 96% 97%  Weight:      Height:        Intake/Output Summary (Last 24 hours) at 06/09/2020 1659 Last data filed at 06/09/2020 1500 Gross per 24 hour  Intake 1080 ml  Output 800 ml  Net 280 ml   Filed Weights   06/05/20 1442  Weight: 56.7 kg    Examination: General exam: Alert, awake, oriented x 3; blind at baseline; no chest pain, no nausea, no vomiting.  Reports some mild intermittent  abdominal discomfort but overall feeling better.  Continue to have bowel movements (less frequency now).  Feeling weak and tired.  Will like to attempt Foley catheter removal. Respiratory system: Clear to auscultation. Respiratory effort normal.  No using accessory muscles. Cardiovascular system:RRR. No murmurs, rubs, gallops.  No JVD. Gastrointestinal system: Abdomen is nondistended, soft and nontender. No organomegaly or masses felt. Normal bowel sounds heard. Central nervous system: Alert and oriented. No new focal neurological deficits. Extremities: No cyanosis or clubbing; left upper extremity with immobilizer in place. Skin: No petechiae. Psychiatry: Judgement and insight appear normal. Mood & affect appropriate.    Data Reviewed: I have personally reviewed following labs and imaging studies  CBC: Recent Labs  Lab 06/05/20 1514 06/06/20 0444 06/09/20 0648  WBC 16.4* 12.7* 11.2*  NEUTROABS 14.5*  --   --   HGB 10.9* 9.5* 9.3*  HCT 35.0* 31.9* 29.9*  MCV 104.5* 105.3* 101.0*  PLT 421* 378 527    Basic Metabolic Panel: Recent Labs  Lab 06/05/20 1514 06/06/20 0221 06/09/20 0648  NA 139 139 134*  K 4.0 4.0 3.8  CL 101 105 99  CO2 25 25 25   GLUCOSE 146* 103* 103*  BUN 33* 25* 18  CREATININE 0.80 0.69 0.89  CALCIUM 10.0 8.9 9.0  MG  --  2.5* 2.0  PHOS  --  2.9  --     GFR: Estimated Creatinine  Clearance: 63.7 mL/min (by C-G formula based on SCr of 0.89 mg/dL).  Liver Function Tests: Recent Labs  Lab 06/05/20 1514 06/06/20 0221  AST 79* 36  ALT 93* 58*  ALKPHOS 291* 210*  BILITOT 0.9 0.8  PROT 7.7 6.0*  ALBUMIN 3.8 2.9*    CBG: No results for input(s): GLUCAP in the last 168 hours.   Recent Results (from the past 240 hour(s))  SARS Coronavirus 2 by RT PCR (hospital order, performed in [facility] hospital lab) Nasopharyngeal Nasopharyngeal Swab     Status: None   Collection Time: 06/02/20  8:29 AM   Specimen: Nasopharyngeal Swab  Result Value Ref  Range Status   SARS Coronavirus 2 NEGATIVE NEGATIVE Final    Comment: (NOTE) SARS-CoV-2 target nucleic acids are NOT DETECTED.  The SARS-CoV-2 RNA is generally detectable in upper and lower respiratory specimens during the acute phase of infection. The lowest concentration of SARS-CoV-2 viral copies this assay can detect is 250 copies / mL. A negative result does not preclude SARS-CoV-2 infection and should not be used as the sole basis for treatment or other patient management decisions.  A negative result may occur with improper specimen collection / handling, submission of specimen other than nasopharyngeal swab, presence of viral mutation(s) within the areas targeted by this assay, and inadequate number of viral copies (<250 copies / mL). A negative result must be combined with clinical observations, patient history, and epidemiological information.  Fact Sheet for Patients:   StrictlyIdeas.no  Fact Sheet for Healthcare Providers: BankingDealers.co.za  This test is not yet approved or  cleared by the [place] FDA and has been authorized for detection and/or diagnosis of SARS-CoV-2 by FDA under an Emergency Use Authorization (EUA).  This EUA will remain in effect (meaning this test can be used) for the duration of the COVID-19 declaration under Section 564(b)(1) of the Act, 21 U.S.C. section 360bbb-3(b)(1), unless the authorization is terminated or revoked sooner.  Performed at [facility], [place] [address], [place], [place] [address]   Culture, blood (single)     Status: None (Preliminary result)   Collection Time: 06/05/20  3:48 PM   Specimen: BLOOD  Result Value Ref Range Status   Specimen Description BLOOD RIGHT ANTECUBITAL  Final   Special Requests   Final    BOTTLES DRAWN AEROBIC AND ANAEROBIC Blood Culture adequate volume   Culture   Final    NO GROWTH 4 DAYS Performed at [facility], [address]  [place], [place] [address]    Report Status PENDING  Incomplete  Resp Panel by RT-PCR (Flu A&B, Covid) Nasopharyngeal Swab     Status: None   Collection Time: 06/05/20  4:15 PM   Specimen: Nasopharyngeal Swab; Nasopharyngeal(NP) swabs in vial transport medium  Result Value Ref Range Status   SARS Coronavirus 2 by RT PCR NEGATIVE NEGATIVE Final    Comment: (NOTE) SARS-CoV-2 target nucleic acids are NOT DETECTED.  The SARS-CoV-2 RNA is generally detectable in upper respiratory specimens during the acute phase of infection. The lowest concentration of SARS-CoV-2 viral copies this assay can detect is 138 copies/mL. A negative result does not preclude SARS-Cov-2 infection and should not be used as the sole basis for treatment or other patient management decisions. A negative result may occur with  improper specimen collection/handling, submission of specimen other than nasopharyngeal swab, presence of viral mutation(s) within the areas targeted by this assay, and inadequate number of viral copies(<138 copies/mL). A negative result must be combined  with clinical observations, patient history, and epidemiological information. The expected result is Negative.  Fact Sheet for Patients:  EntrepreneurPulse.com.au  Fact Sheet for Healthcare Providers:  IncredibleEmployment.be  This test is no t yet approved or cleared by the [place] FDA and  has been authorized for detection and/or diagnosis of SARS-CoV-2 by FDA under an Emergency Use Authorization (EUA). This EUA will remain  in effect (meaning this test can be used) for the duration of the COVID-19 declaration under Section 564(b)(1) of the Act, 21 U.S.C.section 360bbb-3(b)(1), unless the authorization is terminated  or revoked sooner.       Influenza A by PCR NEGATIVE NEGATIVE Final   Influenza B by PCR NEGATIVE NEGATIVE Final    Comment: (NOTE) The Xpert Xpress SARS-CoV-2/FLU/RSV plus assay is  intended as an aid in the diagnosis of influenza from Nasopharyngeal swab specimens and should not be used as a sole basis for treatment. Nasal washings and aspirates are unacceptable for Xpert Xpress SARS-CoV-2/FLU/RSV testing.  Fact Sheet for Patients: EntrepreneurPulse.com.au  Fact Sheet for Healthcare Providers: IncredibleEmployment.be  This test is not yet approved or cleared by the [place] FDA and has been authorized for detection and/or diagnosis of SARS-CoV-2 by FDA under an Emergency Use Authorization (EUA). This EUA will remain in effect (meaning this test can be used) for the duration of the COVID-19 declaration under Section 564(b)(1) of the Act, 21 U.S.C. section 360bbb-3(b)(1), unless the authorization is terminated or revoked.  Performed at [facility], [address], [place], [place] [address]   Blood culture (routine single)     Status: None (Preliminary result)   Collection Time: 06/05/20  5:22 PM   Specimen: BLOOD  Result Value Ref Range Status   Specimen Description BLOOD RIGHT HAND  Final   Special Requests   Final    BOTTLES DRAWN AEROBIC ONLY Blood Culture adequate volume   Culture   Final    NO GROWTH 4 DAYS Performed at [facility], [address] [place], [place] [address]    Report Status PENDING  Incomplete  Urine culture     Status: None   Collection Time: 06/05/20  6:21 PM   Specimen: In/Out Cath Urine  Result Value Ref Range Status   Specimen Description   Final    IN/OUT CATH URINE Performed at [facility], [address] [place], [place] [address]    Special Requests   Final    NONE Performed at [facility], [address], [place], [place] [address]    Culture   Final    NO GROWTH Performed at [facility], [place] [address] [place], [place] [address]    Report Status 06/07/2020 FINAL  Final     Radiology Studies: No results found.  Scheduled Meds: . amoxicillin-clavulanate  1  tablet Oral Q12H  . apixaban  2.5 mg Oral BID  . Chlorhexidine Gluconate Cloth  6 each Topical Daily  . feeding supplement  237 mL Oral BID BM  . linaclotide  145 mcg Oral QAC breakfast  .  morphine injection  2 mg Intravenous Once  . pantoprazole  40 mg Oral Daily  . polyethylene glycol  17 g Oral Daily   Continuous Infusions: . sodium chloride 1,000 mL (06/06/20 1240)     LOS: 4 days    Time spent: 30 minutes    Barton Dubois, MD [facility]   To contact the attending provider between 7A-7P or the covering provider during after hours 7P-7A, please log  into the web site [email or web address] and access using universal [place] password for that web site. If you do not have the password, please call the hospital operator.  06/09/2020, 4:59 PM

## 2020-06-09 NOTE — Plan of Care (Signed)
°  Problem: Acute Rehab PT Goals(only PT should resolve) Goal: Pt Will Go Supine/Side To Sit Outcome: Progressing Flowsheets (Taken 06/09/2020 1111) Pt will go Supine/Side to Sit: with min guard assist Goal: Pt Will Go Sit To Supine/Side Outcome: Progressing Flowsheets (Taken 06/09/2020 1111) Pt will go Sit to Supine/Side: with min guard assist Goal: Patient Will Transfer Sit To/From Stand Outcome: Progressing Flowsheets (Taken 06/09/2020 1111) Patient will transfer sit to/from stand: with min guard assist Goal: Pt Will Transfer Bed To Chair/Chair To Bed Outcome: Progressing Flowsheets (Taken 06/09/2020 1111) Pt will Transfer Bed to Chair/Chair to Bed: with min assist Goal: Pt Will Ambulate Outcome: Progressing Flowsheets (Taken 06/09/2020 1111) Pt will Ambulate:  15 feet  with least restrictive assistive device  with minimal assist Goal: Pt Will Go Up/Down Stairs Outcome: Progressing Flowsheets (Taken 06/09/2020 1111) Pt will Go Up / Down Stairs:  1-2 stairs  with minimal assist  with least restrictive assistive device   Pamala Hurry D. Hartnett-Rands, MS, PT Per [place] [phone] 06/09/2020

## 2020-06-10 DIAGNOSIS — S52302D Unspecified fracture of shaft of left radius, subsequent encounter for closed fracture with routine healing: Secondary | ICD-10-CM | POA: Diagnosis not present

## 2020-06-10 DIAGNOSIS — R1084 Generalized abdominal pain: Secondary | ICD-10-CM | POA: Diagnosis not present

## 2020-06-10 DIAGNOSIS — F411 Generalized anxiety disorder: Secondary | ICD-10-CM | POA: Diagnosis not present

## 2020-06-10 DIAGNOSIS — J449 Chronic obstructive pulmonary disease, unspecified: Secondary | ICD-10-CM | POA: Diagnosis not present

## 2020-06-10 LAB — CULTURE, BLOOD (SINGLE)
Culture: NO GROWTH
Culture: NO GROWTH
Special Requests: ADEQUATE
Special Requests: ADEQUATE

## 2020-06-10 MED ORDER — LINACLOTIDE 72 MCG PO CAPS
72.0000 ug | ORAL_CAPSULE | Freq: Every day | ORAL | Status: DC
  Administered 2020-06-12 – 2020-06-17 (×6): 72 ug via ORAL
  Filled 2020-06-10 (×10): qty 1

## 2020-06-10 NOTE — Progress Notes (Signed)
Physical Therapy Treatment Patient Details Name: Brandon Castillo MRN: [id number] DOB: Jun 09, 1951 Today's Date: 06/10/2020    History of Present Illness Brandon Castillo is a 69 y.o. 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  He denies nausea, vomiting, fever, chills.    PT Comments    Patient demonstrates increased endurance/distance for gait training, required LUE supported at elbow/wrist due to NWB restrictions, slightly unsteady and limited mostly due to c/o fatigue.  Patient tolerated sitting up in chair with his brother in room after therapy.  Patient will benefit from continued physical therapy in hospital and recommended venue below to increase strength, balance, endurance for safe ADLs and gait.    Follow Up Recommendations  SNF;Supervision for mobility/OOB;Supervision - Intermittent     Equipment Recommendations  None recommended by PT    Recommendations for Other Services       Precautions / Restrictions Precautions Precautions: Fall Precaution Comments: Legally blind in both eyes Required Braces or Orthoses: Other Brace;Splint/Cast Other Brace: L forearm long splint, fingers  exposed Restrictions LUE Weight Bearing: Non weight bearing LLE Weight Bearing: Weight bearing as tolerated    Mobility  Bed Mobility Overal bed mobility: Needs Assistance Bed Mobility: Supine to Sit     Supine to sit: Min guard     General bed mobility comments: labored movement for sitting up at bedside with HOB flat  Transfers Overall transfer level: Needs assistance Equipment used: Rolling walker (2 wheeled) Transfers: Sit to/from [facility] Sit to Stand: Min guard;Min assist Stand pivot transfers: Min assist       General transfer comment: labored movement, required support LUE due to long splint, NWB  Ambulation/Gait Ambulation/Gait assistance: Min assist Gait Distance (Feet): 30 Feet Assistive device: Rolling walker (2 wheeled) Gait Pattern/deviations: Decreased step length - right;Decreased step length - left;Decreased stride length;Drifts right/left Gait velocity: Decreased   General Gait Details: supported at left elbow/wrist and used RUE to grip RW, demonstrates slow labored cadence without loss of balance, difficulty making turns, occasional drifting left/right mostly due to blindness, limited for ambulation due to c/o fatigue   Stairs             Wheelchair Mobility    Modified Rankin (Stroke Patients Only)       Balance Overall balance assessment: Needs assistance Sitting-balance support: Feet supported;No upper extremity supported Sitting balance-Leahy Scale: Good Sitting balance - Comments: seated at EOB   Standing balance support: During functional activity;Single extremity supported Standing balance-Leahy Scale: Fair Standing balance comment: using RW                            Cognition Arousal/Alertness: Awake/alert Behavior During Therapy: WFL for tasks assessed/performed Overall Cognitive Status: Within Functional Limits for  tasks assessed                                         Exercises General Exercises - Lower Extremity Long Arc Quad: Seated;AROM;Strengthening;Both;10 reps Hip Flexion/Marching: Seated;AROM;Strengthening;Both;10 reps Toe Raises: Seated;AROM;Strengthening;Both;10 reps Heel Raises: Seated;AROM;Strengthening;Both;10 reps    General Comments        Pertinent Vitals/Pain Pain Assessment: No/denies pain    Home Living                      Prior Function            PT Goals (current goals can now be found in the care plan section) Acute Rehab PT Goals Patient Stated Goal: Return to brother's home after rehab PT Goal Formulation: With patient/family Time For Goal Achievement: 06/23/20 Potential to Achieve Goals: Good Progress towards PT goals: Progressing toward goals    Frequency    Min 3X/week      PT Plan Current plan remains appropriate    Co-evaluation              AM-PAC PT "6 Clicks" Mobility   Outcome Measure  Help needed turning from your back to your side while in a flat bed without using bedrails?: A Little Help needed moving from lying on your back to sitting on the side of a flat bed without using bedrails?: A Little Help needed moving to and from a bed to a chair (including a wheelchair)?: A Little Help needed standing up from a chair using your arms (e.g., wheelchair or bedside chair)?: A Little Help needed to walk in hospital room?: A Lot Help needed climbing 3-5 steps with a railing? : A Lot 6 Click Score: 16    End of Session   Activity Tolerance: Patient tolerated treatment well;Patient limited by fatigue Patient left: in chair;with call bell/phone within reach;with family/visitor present Nurse Communication: Mobility status PT Visit Diagnosis: History of falling (Z91.81);Unsteadiness on feet (R26.81);Other abnormalities of gait and mobility (R26.89);Muscle weakness (generalized) (M62.81)     Time: 7096-2836 PT Time Calculation (min) (ACUTE ONLY): 31 min  Charges:  $Gait Training:  8-22 mins $Therapeutic Exercise: 8-22 mins                     3:30 PM, 06/10/20 Lonell Grandchild, MPT Physical Therapist with [facility] [phone] office [phone] mobile phone

## 2020-06-10 NOTE — TOC Progression Note (Signed)
Transition of Care [facility]) - Progression Note    Patient Details  Name: Brandon Castillo MRN: [id number] Date of Birth: Oct 23, 1950  [facility] CM/SW Contact  Boneta Lucks, RN Phone Number: 06/10/2020, 4:23 PM  Clinical Narrative:   TOC spoke with family. Patient lives with his brother and sister in law. They are with patient 24/7 and still wished to have him home. He is active with [facility] for RN/PT/OT - Vaughan Basta updated, and Active with [facility] for Palliative services- Colletta [place] updated.    Expected Discharge Plan: [place] Barriers to Discharge: Continued Medical Work up  Expected Discharge Plan and Services Expected Discharge Plan: [place] arrangements for the past 2 months: [place] Agency: [place] ([place])    Readmission Risk Interventions Readmission Risk Prevention Plan 06/10/2020  Transportation Screening Complete  HRI or Home Care Consult Complete  Social Work Consult for [place] Planning/Counseling Complete  Palliative Care Screening Complete  Medication Review Press photographer) Complete  Some recent data might be hidden

## 2020-06-10 NOTE — Progress Notes (Addendum)
Patient c/o severe abdominal pain since foley catheter has been discontinued.  Patient had not been able to void, and was feeling severe discomfort.  Patient bladder scanned and it showed greater than 700cc urine.  Notified mid-level and received order for I&O. I&O catheterization attempted by 2 rn, with no success.  Smaller catheter retrieved to try, and again unsuccessful.  Notified MD on call and received order for coude catheter.  Coude inserted and 1100cc yellow urine returned.

## 2020-06-10 NOTE — Progress Notes (Signed)
PROGRESS NOTE    Brandon Castillo  OFB:[id number] DOB: [birth year] DOA: 06/05/2020 PCP: [name], NP   Chief Complaint  Patient presents with  . Abdominal Pain    Brief Narrative:  As per H&P written by Dr. Josephine Cables on 06/05/2020 Brandon Castillo is a 70 y.o. 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 Patient states that he was sleepwalking last night for the first time in his life and today, his brother was worried that he was confused, he complained of abdominal pain and patient states that he only had a small bowel movement yesterday, and was able to pass gas, but he also complained of abdominal pain.  He denies nausea, vomiting, fever, chills.  ED Course:  In the emergency department, he was hemodynamically stable.  Work-up in the ED showed leukocytosis, thrombocytosis, macrocytic anemia, hyperglycemia, transaminitis, lactic acid 3.1.   CT abdomen and pelvis with contrast showed marked wall thickening throughout the transverse colon suspected to be due to ischemia or tumor infiltration.  Large amount of stool in the distal sigmoid colon and rectum compatible with constipation was also noted. Chest x-ray showed no acute cardiopulmonary disease and stable right suprahilar mass Patient was empirically treated with IV cefepime and vancomycin due to presumed intra-abdominal infection, IV hydration was provided.  Hospitalist was asked to admit patient for further evaluation and management.  Assessment &  Plan: 1-generalized abdominal pain -Appears to be secondary to colitis and stool burden/constipation -Patient was chronically started on pain medication -Continue the use of MiraLAX and Linzess -Tolerating Augmentin (3 more days pending to complete therapy).  2-urinary retention/bladder distention -No signs of acute infection currently appreciated -Patient denies dysuria. -Foley catheter placed for decompression. -With high concern for neurogenic bladder; patient will need follow up with urology as an outpatient. -foley cathter to remained in place at discharge -failed voiding trial on 11/29 -Antibiotics for colitis will also help in case that there is any component of colonization/infection.  3-Multiple sclerosis ([place]) -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 -With concerns for neurogenic bladder due to MS. -Appears to be stable overall.  4-Leukocytosis -In the setting of dehydration/colitis -WBCs has trended down and are essentially within normal limits at this point.  5-Anxiety, generalized/insomnia -Continue the use of as needed Ativan and trazodone.  6-closed left radial fracture -Stable and with immobilizer in place -Continue as needed analgesics. -Per records and patient reports, immobilizer cast to come off in about 2 weeks.  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  6-COPD/emphysema -No wheezing or complaining of shortness of breath currently -Continue bronchodilators as needed.  7-Elevated LFTs -In the setting of most likely lung cancer metastasis to the liver -Continue to follow trend -Continue to maintain adequate hydration orally. -Liver ultrasound to be pursued as an outpatient by the oncology service discretion.Marland Kitchen  8-Lactic acidosis -Resolved with fluid resuscitation.  9-sepsis present on admission -Patient with positive SIRS criteria on presentation and having source of infection colitis as  appreciated on his abdominal imaging studies -Will continue antibiotics, using now Augmentin to complete therapy. -  Continue supportive care and follow resolution of sepsis features -White blood cells has trended down and essentially within normal limits; patient has remained afebrile. -No nausea vomiting reported.  10-physical deconditioning -After long discussion with patient and brother at bedside they would like to pursue short-term and rehab at a skilled nursing facility. -TOC made aware.   DVT prophylaxis: Chronically on Eliquis Code Status: DNR Family Communication: Patient updated over the phone. Disposition:   Status is: Inpatient  Dispo: The patient is from: Home              Anticipated d/c is to: Home              Anticipated d/c date is: 06/10/20 (if found stable to go home with home health services).              Patient currently medically stable for discharge; feeling significantly weak and deconditioned; still requiring foley catheter after failing voiding trial; will need outpatient follow up with urology. Having ongoing abdominal pain requiring intermittent IV pain medication (but stable).  Patient has tolerated the rest of his medication by mouth without problems.  Physical therapy recommending home health with 24/7 assistance versus skilled nursing facility; after long discussion with patient and his brother at bedside they had decided to pursued short-term rehabilitation at SNF to improve conditioning and facilitate care at discharge.   Consultants:   None   Procedures:  See below for x-ray reports.   Antimicrobials:  Unasyn on admission Tolerating augmentin and with 3 more days pending.   Subjective: Reports feeling weak, tired and deconditioned.  Expressed having 2-3 loose stools per days at this moment and denies abdominal pain.  Failed voiding trial on 06/09/2020.  Objective: Vitals:   06/09/20 1520 06/09/20 2114 06/10/20 0428 06/10/20 1436  BP: (!)  146/74 (!) 151/77 132/79 (!) 151/96  Pulse: 79 88 81 100  Resp: 18 20 20 20   Temp: 97.6 F (36.4 C) 98.2 F (36.8 C) 97.9 F (36.6 C) 99.1 F (37.3 C)  TempSrc: Oral Oral Oral Oral  SpO2: 97% 97% 97% 97%  Weight:      Height:        Intake/Output Summary (Last 24 hours) at 06/10/2020 1656 Last data filed at 06/10/2020 1500 Gross per 24 hour  Intake 720 ml  Output 1850 ml  Net -1130 ml   Filed Weights   06/05/20 1442  Weight: 56.7 kg    Examination: General exam: Alert, awake, oriented x 3; reports feeling weak and deconditioned; no abdominal pain currently.  Patient failed voiding trial 06/09/20. Respiratory system: Clear to auscultation. Respiratory effort normal. Cardiovascular system:RRR. No murmurs, rubs, gallops.  No JVD. Gastrointestinal system: Abdomen is nondistended, soft and nontender. No organomegaly or masses felt. Normal bowel sounds heard. Central nervous system: Alert and oriented. No focal neurological deficits. Extremities: No cyanosis or clubbing Skin: No petechiae; right Port-A-Cath in place. Psychiatry: Judgement and insight appear normal. Mood & affect appropriate.   Data Reviewed: I have personally reviewed following labs and imaging studies  CBC: Recent Labs  Lab 06/05/20 1514 06/06/20 0444 06/09/20 0648  WBC 16.4* 12.7* 11.2*  NEUTROABS 14.5*  --   --   HGB 10.9* 9.5* 9.3*  HCT 35.0* 31.9* 29.9*  MCV 104.5* 105.3* 101.0*  PLT 421* 378 696    Basic Metabolic Panel: Recent Labs  Lab 06/05/20 1514 06/06/20 0221 06/09/20 0648  NA 139 139 134*  K 4.0 4.0 3.8  CL 101 105 99  CO2 25 25 25   GLUCOSE 146* 103* 103*  BUN 33* 25* 18  CREATININE 0.80 0.69 0.89  CALCIUM 10.0 8.9 9.0  MG  --  2.5* 2.0  PHOS  --  2.9  --     GFR: Estimated Creatinine Clearance: 63.7 mL/min (by C-G formula based on SCr of 0.89 mg/dL).  Liver Function Tests: Recent Labs  Lab 06/05/20 1514 06/06/20 0221  AST 79* 36  ALT 93* 58*  ALKPHOS 291* 210*   BILITOT 0.9 0.8  PROT 7.7 6.0*  ALBUMIN 3.8 2.9*    CBG: No results for input(s): GLUCAP in the last 168 hours.   Recent Results (from the past 240 hour(s))  SARS Coronavirus 2 by RT PCR (hospital order, performed in [facility] hospital lab) Nasopharyngeal Nasopharyngeal Swab     Status: None   Collection Time: 06/02/20  8:29 AM   Specimen: Nasopharyngeal Swab  Result Value Ref Range Status   SARS Coronavirus 2 NEGATIVE NEGATIVE Final    Comment: (NOTE) SARS-CoV-2 target nucleic acids are NOT DETECTED.  The SARS-CoV-2 RNA is generally detectable in upper and lower respiratory specimens during the acute phase of infection. The lowest concentration of SARS-CoV-2 viral copies this assay can detect is 250 copies / mL. A negative result does not preclude SARS-CoV-2 infection and should not be used as the sole basis for treatment or other patient management decisions.  A negative result may occur with improper specimen collection / handling, submission of specimen other than nasopharyngeal swab, presence of viral mutation(s) within the areas targeted by this assay, and inadequate number of viral copies (<250 copies / mL). A negative result must be combined with clinical observations, patient history, and epidemiological information.  Fact Sheet for Patients:   StrictlyIdeas.no  Fact Sheet for Healthcare Providers: BankingDealers.co.za  This test is not yet approved or  cleared by the [place] FDA and has been authorized for detection and/or diagnosis of SARS-CoV-2 by FDA under an Emergency Use Authorization (EUA).  This EUA will remain in effect (meaning this test can be used) for the duration of the COVID-19 declaration under Section 564(b)(1) of the Act, 21 U.S.C. section 360bbb-3(b)(1), unless the authorization is terminated or revoked sooner.  Performed at [facility], [place] [address] [place], [place] [address]    Culture, blood (single)     Status: None   Collection Time: 06/05/20  3:48 PM   Specimen: BLOOD  Result Value Ref Range Status   Specimen Description BLOOD RIGHT ANTECUBITAL  Final   Special Requests   Final    BOTTLES DRAWN AEROBIC AND ANAEROBIC Blood Culture adequate volume   Culture   Final    NO GROWTH 5 DAYS Performed at [facility], [address], [place], [place] [address]    Report Status 06/10/2020 FINAL  Final  Resp Panel by RT-PCR (Flu A&B, Covid) Nasopharyngeal Swab     Status: None   Collection Time: 06/05/20  4:15 PM   Specimen: Nasopharyngeal Swab; Nasopharyngeal(NP) swabs in vial transport medium  Result Value Ref Range Status   SARS Coronavirus 2 by RT PCR NEGATIVE NEGATIVE Final    Comment: (NOTE) SARS-CoV-2 target nucleic acids are NOT DETECTED.  The SARS-CoV-2 RNA is generally detectable in upper respiratory specimens during the acute phase of infection. The lowest concentration of SARS-CoV-2 viral copies this assay can detect is 138 copies/mL. A negative result does not preclude SARS-Cov-2 infection and should not be used as the sole basis for treatment or  other patient management decisions. A negative result may occur with  improper specimen collection/handling, submission of specimen other than nasopharyngeal swab, presence of viral mutation(s) within the areas targeted by this assay, and inadequate number of viral copies(<138 copies/mL). A negative result must be combined with clinical observations, patient history, and epidemiological information. The expected result is Negative.  Fact Sheet for Patients:  EntrepreneurPulse.com.au  Fact Sheet for Healthcare Providers:  IncredibleEmployment.be  This test is no t yet approved or cleared by the [place] FDA and  has been authorized for detection and/or diagnosis of SARS-CoV-2 by FDA under an Emergency Use Authorization (EUA). This EUA will remain  in effect  (meaning this test can be used) for the duration of the COVID-19 declaration under Section 564(b)(1) of the Act, 21 U.S.C.section 360bbb-3(b)(1), unless the authorization is terminated  or revoked sooner.       Influenza A by PCR NEGATIVE NEGATIVE Final   Influenza B by PCR NEGATIVE NEGATIVE Final    Comment: (NOTE) The Xpert Xpress SARS-CoV-2/FLU/RSV plus assay is intended as an aid in the diagnosis of influenza from Nasopharyngeal swab specimens and should not be used as a sole basis for treatment. Nasal washings and aspirates are unacceptable for Xpert Xpress SARS-CoV-2/FLU/RSV testing.  Fact Sheet for Patients: EntrepreneurPulse.com.au  Fact Sheet for Healthcare Providers: IncredibleEmployment.be  This test is not yet approved or cleared by the [place] FDA and has been authorized for detection and/or diagnosis of SARS-CoV-2 by FDA under an Emergency Use Authorization (EUA). This EUA will remain in effect (meaning this test can be used) for the duration of the COVID-19 declaration under Section 564(b)(1) of the Act, 21 U.S.C. section 360bbb-3(b)(1), unless the authorization is terminated or revoked.  Performed at [facility], [address], [place], [place] [address]   Blood culture (routine single)     Status: None   Collection Time: 06/05/20  5:22 PM   Specimen: BLOOD  Result Value Ref Range Status   Specimen Description BLOOD RIGHT HAND  Final   Special Requests   Final    BOTTLES DRAWN AEROBIC ONLY Blood Culture adequate volume   Culture   Final    NO GROWTH 5 DAYS Performed at [facility], [address] [place], [place] [address]    Report Status 06/10/2020 FINAL  Final  Urine culture     Status: None   Collection Time: 06/05/20  6:21 PM   Specimen: In/Out Cath Urine  Result Value Ref Range Status   Specimen Description   Final    IN/OUT CATH URINE Performed at [facility], [address], [place], [place]  [address]    Special Requests   Final    NONE Performed at [facility], [address] [place], [place] [address]    Culture   Final    NO GROWTH Performed at [facility], [place] [address], [place], [place] [address]    Report Status 06/07/2020 FINAL  Final     Radiology Studies: No results found.  Scheduled Meds: . amoxicillin-clavulanate  1 tablet Oral Q12H  . apixaban  2.5 mg Oral BID  . Chlorhexidine Gluconate Cloth  6 each Topical Daily  . feeding supplement  237 mL Oral BID BM  . [START ON 06/11/2020] linaclotide  72 mcg Oral QAC breakfast  .  morphine injection  2 mg Intravenous Once  . pantoprazole  40 mg Oral Daily  . polyethylene glycol  17 g Oral Daily   Continuous Infusions: .  sodium chloride 1,000 mL (06/06/20 1240)     LOS: 5 days    Time spent: 30 minutes    Barton Dubois, MD [facility]   To contact the attending provider between 7A-7P or the covering provider during after hours 7P-7A, please log into the web site [email or web address] and access using universal [place] password for that web site. If you do not have the password, please call the hospital operator.  06/10/2020, 4:56 PM

## 2020-06-11 DIAGNOSIS — R1084 Generalized abdominal pain: Secondary | ICD-10-CM | POA: Diagnosis not present

## 2020-06-11 MED ORDER — HYDROCODONE-ACETAMINOPHEN 10-325 MG PO TABS: 1.0000 | ORAL_TABLET | Freq: Four times a day (QID) | ORAL | 0 refills | Status: DC | PRN

## 2020-06-11 MED ORDER — LORAZEPAM 1 MG PO TABS: 1.0000 mg | ORAL_TABLET | Freq: Every evening | ORAL | 0 refills | Status: DC | PRN

## 2020-06-11 MED ORDER — METHOCARBAMOL 500 MG PO TABS
500.0000 mg | ORAL_TABLET | Freq: Every day | ORAL | 0 refills | Status: DC | PRN
Start: 2020-06-11 — End: 2020-06-11

## 2020-06-11 MED ORDER — AMOXICILLIN-POT CLAVULANATE 875-125 MG PO TABS: 1.0000 | ORAL_TABLET | Freq: Two times a day (BID) | ORAL | 0 refills | Status: AC

## 2020-06-11 MED ORDER — LINACLOTIDE 72 MCG PO CAPS
72.0000 ug | ORAL_CAPSULE | Freq: Every day | ORAL | 0 refills | Status: DC
Start: 2020-06-12 — End: 2020-06-20

## 2020-06-11 NOTE — Progress Notes (Signed)
Physical Therapy Treatment Patient Details Name: ELMER MERWIN MRN: [id number] DOB: 02-03-1951 Today's Date: 06/11/2020    History of Present Illness Brandon Castillo is a 69 y.o. 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  He denies nausea, vomiting, fever, chills.    PT Comments    Pt friendly, talkative and willing to participate with therapy.  Mod I with bed mobility, increased time and cueing for awareness of space due to blind.  Pt requested HOB raise to assist with supine to sitting.  Pt educated on purpose of platform walker prior standing with cueing for Rt UE assistance from bed rather than walker for safety with stand.  Upon standing pt c/o dizziness, SOB, nausea.  Pt directed to sit in chair and vitals assessed, all WNL.  RN aware of status.  EOS pt left in chair with call bell within reach and chair alarm set. \   Follow Up Recommendations  SNF;Supervision for mobility/OOB;Supervision - Intermittent     Equipment Recommendations  None recommended by PT    Recommendations for Other Services       Precautions / Restrictions Precautions Precautions: Fall Precaution Comments: Legally blind in both  eyes Required Braces or Orthoses: Other Brace;Splint/Cast Other Brace: L forearm long splint, fingers exposed Restrictions Weight Bearing Restrictions: Yes LUE Weight Bearing: Non weight bearing LLE Weight Bearing: Weight bearing as tolerated    Mobility  Bed Mobility Overal bed mobility: Modified Independent       Supine to sit: Min guard     General bed mobility comments: labored slow movements, requested HOB raise to assist  Transfers Overall transfer level: Needs assistance Equipment used: Rolling walker (2 wheeled) Transfers: Sit to/from Stand Sit to Stand: Min assist (cueing for Rt UE placement)         General transfer comment: labored movement, required support LUE due to long splint, NWB  Ambulation/Gait Ambulation/Gait assistance: Min assist Gait Distance (Feet): 6 Feet Assistive device: Left platform walker Gait Pattern/deviations: Decreased step length - right;Decreased step length - left;Decreased stride length;Drifts right/left     General Gait Details: Educated use of platform walker for Lt UE assistance with tactile cueing.  Pt c/o dizziness upon standing, shakey LE, SOB, fatigue and nausea.  Directed to chair with vitals assessed WNL.  RN aware.   Stairs             Wheelchair Mobility    Modified Rankin (Stroke Patients Only)       Balance                                            Cognition Arousal/Alertness: Awake/alert Behavior During Therapy: WFL for  tasks assessed/performed Overall Cognitive Status: Within Functional Limits for tasks assessed                                 General Comments: Pleasant and motivated. Talkative, easily redirected      Exercises      General Comments        Pertinent Vitals/Pain Pain Assessment: 0-10 Pain Score: 9  Faces Pain Scale: Hurts a little bit Pain Location: abdomen, reports decreased pain following medication Pain Descriptors / Indicators:  Discomfort;Sore;Guarding Pain Intervention(s): Limited activity within patient's tolerance;Monitored during session;Repositioned    Home Living                      Prior Function            PT Goals (current goals can now be found in the care plan section)      Frequency    Min 3X/week      PT Plan Current plan remains appropriate    Co-evaluation              AM-PAC PT "6 Clicks" Mobility   Outcome Measure  Help needed turning from your back to your side while in a flat bed without using bedrails?: A Little Help needed moving from lying on your back to sitting on the side of a flat bed without using bedrails?: A Little Help needed moving to and from a bed to a chair (including a wheelchair)?: A Little Help needed standing up from a chair using your arms (e.g., wheelchair or bedside chair)?: A Little Help needed to walk in hospital room?: A Lot Help needed climbing 3-5 steps with a railing? : A Lot 6 Click Score: 16    End of Session Equipment Utilized During Treatment: Gait belt Activity Tolerance: Patient tolerated treatment well;Patient limited by fatigue Patient left: in chair;with call bell/phone within reach;with chair alarm set Nurse Communication: Mobility status PT Visit Diagnosis: History of falling (Z91.81);Unsteadiness on feet (R26.81);Other abnormalities of gait and mobility (R26.89);Muscle weakness (generalized) (M62.81)     Time: 1100-1130 PT Time Calculation (min) (ACUTE ONLY): 30 min  Charges:  $Gait Training: 8-22 mins $Therapeutic Exercise: 8-22 mins                     Ihor Austin, LPTA/CLT; CBIS [phone]  Aldona Lento 06/11/2020, 11:38 AM

## 2020-06-11 NOTE — Plan of Care (Signed)

## 2020-06-11 NOTE — TOC Progression Note (Signed)
Transition of Care [facility]) - Progression Note    Patient Details  Name: Brandon Castillo MRN: [id number] Date of Birth: 1950/11/06  Transition of Care [facility]) CM/SW [place], [place] Phone Number: 06/11/2020, 12:45 PM  Clinical Narrative:    CSW contacted patient's brother to inquire about if patient preferred [facility] and SNF. Patient's brother reported that patient preferred SNF. Patient's brother reported that the family would be agreeable to [facility] SNF referrals. CSW completed FL2 and placed referrals. [facility] agreeable to take patient for SNF placement. CSW spoke to patient's brother to inquire if agreeable to [facility]. Tami agreeable to Start auth for patient. TOC to follow.   Expected Discharge Plan: [place] Barriers to Discharge: Continued Medical Work up  Expected Discharge Plan and Services Expected Discharge Plan: [place] arrangements for the past 2 months: Single Family Home Expected Discharge Date: 06/11/20                           [facility] Agency: [place] [facility]         Social Determinants of Health (SDOH) Interventions    Readmission Risk Interventions Readmission Risk Prevention Plan 06/10/2020  Transportation Screening Complete  HRI or Home Care Consult Complete  Social Work Consult for [place] Planning/Counseling Complete  Palliative Care Screening Complete  Medication Review [employer] photographer) Complete  Some recent data might be hidden

## 2020-06-11 NOTE — Discharge Summary (Signed)
Physician Discharge Summary  SHANON BECVAR SAY:[id number] DOB: 23-Aug-1950 DOA: 06/05/2020  PCP: [name], NP  Admit date: 06/05/2020  Discharge date: 06/11/2020  Admitted From:Home  Disposition:  SNF  Recommendations for Outpatient Follow-up:  1. Follow up with PCP in 1-2 weeks 2. Follow-up with urology in outpatient setting in the next 1-2 weeks as arranged to evaluate Foley catheter and neurogenic bladder 3. Continue on Augmentin as prescribed for 3 more days to complete course of treatment for sepsis present on admission with colitis 4. Continue other home medications as prior 5. Per records, immobilizer cast to come off in about 2 weeks for close left radial fracture, continue as needed analgesics 6. 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 Patient states that he was sleepwalking last night for the first time in his life and today,his brother was worried that he was confused, he complained of abdominal pain and patient states that he only had a small bowel movement yesterday, and was able to pass gas, but he also complained of abdominal pain. He denies nausea,  vomiting, fever, chills.  ED Course: In the emergency department, he was hemodynamically stable. Work-up in the ED showed leukocytosis, thrombocytosis, macrocytic anemia, hyperglycemia, transaminitis, lactic acid 3.1.  CT abdomen and pelvis with contrast showed marked wall thickening throughout the transverse colon suspected to be due to ischemia or tumor infiltration. Large amount of stool in the distal sigmoid colon and rectum compatible with constipation was also noted. Chest x-ray showed no acute cardiopulmonary disease and stable right suprahilar mass Patient was empirically treated with IV cefepimeand vancomycin due to presumed intra-abdominal infection, IV hydration was provided. Hospitalist was asked to admit patient for further evaluation and management.  -Patient was admitted with generalized abdominal pain secondary to concern for sepsis present on admission with infectious colitis noted on abdominal imaging studies. He was initially started on IV Unasyn and then transition to oral Augmentin. During the course of his admission he was noted to have acute urinary retention and bladder distention for which Foley catheter was placed for decompression and he failed voiding trial on 11/29 and therefore catheter was replaced. It is highly suspected that he may have neurogenic bladder in the setting of his multiple sclerosis and he will need close follow-up with urology in the outpatient setting to discuss further management. He is also noted to have history of stage IV squamous cell carcinoma of the right lung with metastatic disease and he follows up with oncology in outpatient setting. He has been seen by physical therapy with recommendations for skilled rehab which will be arranged upon discharge. He has had no other acute concerns or events noted throughout the course of this admission.  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 Neurogenic bladder with Foley catheter placement.  Discharge Instructions  Discharge Instructions    Ambulatory referral to Urology   Complete by: As directed    Neurogenic bladder with foley catheter follow up.   Diet - low sodium heart healthy   Complete by: As directed    Discharge wound care:   Complete by: As directed    Daily wound dressing changes.   Increase activity slowly   Complete by: As directed      Allergies as of 06/11/2020      Reactions   Demerol [meperidine Hcl] Other (See Comments)   Had trouble waking up      Medication List    STOP taking these medications   methocarbamol 500 MG tablet Commonly known as: ROBAXIN   Oxycodone HCl 10 MG Tabs     TAKE these medications   amoxicillin-clavulanate 875-125 MG tablet Commonly known as: AUGMENTIN Take 1 tablet by mouth every 12 (twelve) hours for 3 days.   apixaban 2.5 MG Tabs tablet Commonly known as: ELIQUIS Take 1 tablet (2.5 mg total) by mouth 2 (two) times daily.   feeding supplement Liqd Take 237 mLs by mouth 3 (three) times daily. What changed: when to take this   ferrous sulfate 325 (65 FE) MG tablet Take 1 tablet (325 mg total) by mouth 2 (two) times daily with a meal.   HYDROcodone-acetaminophen 10-325 MG tablet Commonly known as: NORCO Take 1 tablet by mouth every 6 (six) hours as needed.   linaclotide 72 MCG capsule Commonly known as: LINZESS Take 1  capsule (72 mcg total) by mouth daily before breakfast. Start taking on: June 12, 2020   LORazepam 1 MG tablet Commonly known as: ATIVAN Take 1 tablet (1 mg total) by mouth at bedtime as needed for sleep.   multivitamin with minerals tablet Take 1 tablet by mouth daily.   multivitamin-lutein Caps capsule Take 1 capsule by mouth daily.   ondansetron 4 MG tablet Commonly known as: ZOFRAN Take 1 tablet (4 mg total) by mouth every 6 (six) hours as needed for nausea.   pantoprazole 40 MG tablet Commonly known as: PROTONIX Take 1 tablet (40 mg total) by mouth daily. What changed:   when to take this  reasons to take this   polyethylene glycol 17 g packet Commonly known as: MIRALAX / GLYCOLAX Take 17 g by mouth daily. What changed:   when to take this  reasons to take this   senna-docusate 8.6-50 MG tablet Commonly known as: Senokot-S Take 2 tablets by mouth at bedtime. What changed:   when to take this  reasons to take this   traZODone 100 MG tablet Commonly known as: DESYREL Take 0.5-1 tablets (50-100 mg total) by mouth at bedtime. What changed:   when to take this  reasons to take this            Discharge Care Instructions  (From admission, onward)         Start     Ordered   06/11/20 0000  Discharge wound care:       Comments: Daily wound dressing changes.   06/11/20 1058          Follow-up Information    [facility].   Specialty: [place] Why: PT/ RN/OT       [place] [place] Follow up.   Specialty: Hosp[facility] Why:  Palliative services Contact information: [address] [place] [phone]       [name], NP Follow up in 1 week(s).   Specialty: Nurse Practitioner Contact information: [address]  [address] [place] [address] [phone]              Allergies  Allergen Reactions  . Demerol [Meperidine Hcl] Other  (See Comments)    Had trouble waking up    Consultations:  None   Procedures/Studies: DG Chest 1 View  Result Date: 05/17/2020 CLINICAL DATA:  Initial evaluation for acute trauma, fall. EXAM: CHEST  1 VIEW COMPARISON:  Prior radiograph from 05/11/2020. FINDINGS: Cardiac and mediastinal silhouettes are stable in size and contour, and may within normal limits. Aortic atherosclerosis. Lungs are hyperinflated with underlying emphysematous changes. No focal infiltrates. No edema or effusion. No pneumothorax. No acute osseous finding. IMPRESSION: 1. No active cardiopulmonary disease. 2. Aortic Atherosclerosis (ICD10-I70.0) and Emphysema (ICD10-J43.9). Electronically Signed   By: Jeannine Boga M.D.   On: 05/17/2020 04:27   DG Wrist Complete Left  Result Date: 05/17/2020 CLINICAL DATA:  Initial evaluation for acute pain and swelling, fall downstairs. EXAM: LEFT WRIST - COMPLETE 3+ VIEW COMPARISON:  None. FINDINGS: Acute comminuted fracture of the distal right radius with slight impaction and dorsal angulation. Associated intra-articular extension. Distal ulna intact. Diffuse soft tissue swelling present about the wrist. IMPRESSION: Acute comminuted fracture of the distal right radius with intra-articular extension and dorsal angulation. Electronically Signed   By: Jeannine Boga M.D.   On: 05/17/2020 04:19   DG Ankle Complete Left  Result Date: 05/17/2020 CLINICAL DATA:  Initial evaluation for acute trauma, fall. EXAM: LEFT ANKLE COMPLETE - 3+ VIEW COMPARISON:  None. FINDINGS: No acute fracture dislocation. Ankle mortise approximated. Corticated osseous density at the medial malleolus consistent with a chronic finding. Diffuse soft tissue swelling present about the ankle. Underlying osteoarthritic changes noted. IMPRESSION: 1. No acute fracture or dislocation. 2. Diffuse soft tissue swelling about the ankle. Electronically Signed   By: Jeannine Boga M.D.   On: 05/17/2020 04:21   CT  Head Wo Contrast  Result Date: 05/17/2020 CLINICAL DATA:  Status post trauma.  Fall. EXAM: CT HEAD WITHOUT CONTRAST TECHNIQUE: Contiguous axial images were obtained from the base of the skull through the vertex without intravenous contrast. COMPARISON:  Brain MRI 05/12/2020 FINDINGS: Brain: No evidence of acute infarction, hemorrhage, hydrocephalus, extra-axial collection or mass lesion/mass effect. There is mild diffuse low-attenuation within the subcortical and periventricular white matter compatible with chronic microvascular disease. Prominence of the sulci and ventricles compatible with mild brain atrophy. Vascular: No hyperdense vessel or unexpected calcification. Skull: Normal. Negative for fracture or focal lesion. Sinuses/Orbits: No acute finding. Other: None. IMPRESSION: 1. No acute intracranial abnormalities. 2. Chronic small vessel ischemic change and brain atrophy. Electronically Signed   By: Kerby Moors M.D.   On: 05/17/2020 04:10   MR ABDOMEN W WO CONTRAST  Result Date: 05/14/2020 CLINICAL DATA:  Evaluate for liver metastases. EXAM: MRI ABDOMEN WITHOUT AND WITH CONTRAST TECHNIQUE: Multiplanar multisequence MR imaging of the abdomen was  performed both before and after the administration of intravenous contrast. CONTRAST:  70mL GADAVIST GADOBUTROL 1 MMOL/ML IV SOLN COMPARISON:  CT AP 05/11/2020 FINDINGS: Lower chest: No acute findings. Hepatobiliary: Within segment 5 there is a T1 hypointense and mildly T2 hyperintense lesion adjacent to the gallbladder fossa which shows peripheral enhancement consistent with a metastatic lesion. This measures approximately 2.1 x 1.7 cm, image 49/16. Within the subcapsular aspect of the posterior right hepatic lobe there is a 1.3 x 0.7 cm T2 hyperintense and T1 hypointense structure without corresponding enhancement which is favored to represent a benign abnormality. The gallbladder is unremarkable. No bile duct dilatation identified. Pancreas: No mass,  inflammatory changes, or other parenchymal abnormality identified. Spleen:  Within normal limits in size and appearance. Adrenals/Urinary Tract:  Normal appearance of the adrenal glands. Again seen are multiple peripherally enhancing right kidney lesions. The dominant lesion arises from the inferior pole of the right kidney measuring 5.1 x 5.4 cm, image 65/6. This is T1 hypointense, T2 isointense with mild peripheral enhancement. Similarly, arising from the medial cortex of the upper pole there is a irregular mass measuring 3.4 x 2.5 cm which is T2 isointense, T1 hypointense, with peripheral enhancement. This lesion also contains a thin internal area of linear enhancement, image 48/16. Also arising from the upper pole of the right kidney is a small peripherally enhancing lesion which is T1 hypointense, teen 2 hyperintense and measures 1.9 x 1.5 cm, image 49/16. Small simple appearing cyst is identified involving the lateral cortex of the right kidney measuring 0.5 cm, image 18/4. 2 distinct lesions are noted arising from the upper pole of left kidney which have similar signal and enhancement characteristics to the suspicious right kidney lesions, image 34/18. The largest of these measures 1.5 cm. Stomach/Bowel: Visualized portions within the abdomen are unremarkable. Vascular/Lymphatic: No signs of renal vein involvement bilaterally. The IVC is patent without evidence for tumor thrombus. Aortic atherosclerosis. No aneurysm. No abdominal adenopathy. Other: No ascites or focal fluid collections identified within the imaged portions of the upper abdomen. Musculoskeletal: No suspicious bone lesions identified. IMPRESSION: 1. Again seen are multiple, bilateral peripherally enhancing kidney lesions. In the setting of a suspicious lung mass, the multiplicity, signal and enhancement characteristics of these lesions favor metastatic disease. Differential considerations include multiple cystic renal cell carcinomas versus  multiple liver metastases. 2. Liver lesion adjacent to the gallbladder fossa has signal and enhancement characteristics favoring a metastatic lesion. Electronically Signed   By: Kerby Moors M.D.   On: 05/14/2020 05:11   CT ABDOMEN PELVIS W CONTRAST  Result Date: 06/05/2020 CLINICAL DATA:  The abdominal distension. Stage IV lung cancer. Known liver metastases. EXAM: CT ABDOMEN AND PELVIS WITH CONTRAST TECHNIQUE: Multidetector CT imaging of the abdomen and pelvis was performed using the standard protocol following bolus administration of intravenous contrast. CONTRAST:  68mL OMNIPAQUE IOHEXOL 300 MG/ML  SOLN COMPARISON:  CT of the abdomen and pelvis with contrast 05/11/2020. FINDINGS: Lower chest: Scarring at the right lung base is stable. Heart size is normal. No significant pleural or pericardial effusion is present. Hepatobiliary: Focal hepatic lesion near the gallbladder fossa has increased in size, now measuring 2 4 cm. No new hepatic lesions are present. The common bile duct and gallbladder. Pancreas: Unremarkable. No pancreatic ductal dilatation or surrounding inflammatory changes. Spleen: Normal in size without focal abnormality. Adrenals/Urinary Tract: Adrenal glands are within normal limits bilaterally. Multiple hypodense renal lesions have increased in size. The largest is at the lower pole of the  right kidney, now measuring 6.0 x 6.5 x 6.0 cm. This displaces the collecting system upwards with mild caliectasis. Additional lesions near the upper pole of the right kidney have a similar density. The largest scratched at the second largest lesion measures up to 4.1 cm. Two similar lesions are present the upper pole of the left kidney both increased in size. The larger measures 1.9 cm on the coronal images. Ureters are within normal limits. The urinary bladder is distended present. Poor collaterals obscured artifact placement Stomach/Bowel: Acute. Is. Soft tissue density near the tip of the cecum may  represent peritoneal implant measuring 2.4 x 1.6 x 1.5 cm. Proximal ascending colon is within normal limits. There is some wall thickening in the distal ascending colon marked wall thickening throughout the ears: The wall thickening is predominantly on the mesenteric surface. Anti mesenteric surface the colon is unremarkable. The more distal descending colon is normal. A large amount of stool is present in the distal sigmoid colon and rectum a transverse diameter of the rectum up to 9.2 cm. Vascular/Lymphatic: Aortocaval node at the level of the kidneys is stable 6 mm in short axis on image 29. No significant retroperitoneal adenopathy is present. Atherosclerotic calcifications are present aorta and branch vessels without aneurysm. Celiac and SMA origins are within limits. Reproductive: Prostate is enlarged, stable. Other: No free air is present. No significant ventral hernia is present. Musculoskeletal: Interval superior endplate fractures present at L1 25% loss of height is noted anteriorly without retropulsed bone. Chronic endplate changes are present left at L4-5. No focal lytic or blastic lesions are present. Bony pelvis is intact. Left total hip arthroplasty noted. The right hip is normal. IMPRESSION: 1. Marked wall thickening throughout the transverse colon. This is an atypical appearance of edema. While this could represent ischemia increased lactic acid level, tumor infiltration is also considered. 2. Large amount of stool in the distal sigmoid colon and rectum compatible with constipation. The transverse diameter of the rectum up to 9.2 cm. 3. Enlarging hypodense lesions in the kidneys bilaterally consistent with progressive metastatic disease. 4. Enlarging hypodense lesion near the gallbladder fossa is also consistent with progressive metastasis. 5. Soft tissue density near the tip of the cecum may represent peritoneal implant measuring 2.4 x 1.6 x 1.5 cm. No other definite peritoneal implants are  evident. 6. Marked distention of the urinary bladder. This accounts for much of the lower abdominal distension. 7. Interval superior endplate compression fracture at L1 with 25% loss of height anteriorly without retropulsed bone. 8. Aortic Atherosclerosis (ICD10-I70.0). Electronically Signed   By: San Morelle M.D.   On: 06/05/2020 18:17   Pelvis Portable  Result Date: 05/18/2020 CLINICAL DATA:  Left total hip arthroplasty EXAM: PORTABLE PELVIS 1-2 VIEWS COMPARISON:  05/17/2020 left hip radiographs FINDINGS: Interval left total hip arthroplasty with no evidence of hardware fracture or loosening. No evidence of hip dislocation on these frontal views. No osseous fracture. No pelvic diastasis. No focal osseous lesions. Expected soft tissue gas surrounding the left hip. IMPRESSION: Satisfactory immediate postoperative frontal view appearance status post left total hip arthroplasty. Electronically Signed   By: Ilona Sorrel M.D.   On: 05/18/2020 15:44   CT FOOT LEFT WO CONTRAST  Result Date: 05/17/2020 CLINICAL DATA:  Ankle and foot injury question of fracture EXAM: CT OF THE LEFT FOOT WITHOUT CONTRAST TECHNIQUE: Multidetector CT imaging of the left foot was performed according to the standard protocol. Multiplanar CT image reconstructions were also generated. COMPARISON:  None. FINDINGS: Bones/Joint/Cartilage  No fracture or dislocation. No widening of the Lisfranc joint is seen. There is diffuse osteopenia. Small well corticated ossicle seen at the adjacent to the medial malleolus. Mild tibiotalar joint osteoarthritis seen with joint space loss and subchondral sclerosis. First MTP joint osteoarthritis is with joint space. Ligaments Suboptimally assessed by CT. Muscles and Tendons The muscles surrounding the foot are intact without focal atrophy or tear. The flexor and extensor tendons intact. The plantar fascia and Achilles tendon. Soft tissues Soft tissue swelling.  No ankle joint effusion is noted.  IMPRESSION: No acute fracture or dislocation the ankle or foot. Electronically Signed   By: Prudencio Pair M.D.   On: 05/17/2020 21:24   CT ANKLE LEFT WO CONTRAST  Result Date: 05/17/2020 CLINICAL DATA:  Ankle and foot injury question of fracture EXAM: CT OF THE LEFT FOOT WITHOUT CONTRAST TECHNIQUE: Multidetector CT imaging of the left foot was performed according to the standard protocol. Multiplanar CT image reconstructions were also generated. COMPARISON:  None. FINDINGS: Bones/Joint/Cartilage No fracture or dislocation. No widening of the Lisfranc joint is seen. There is diffuse osteopenia. Small well corticated ossicle seen at the adjacent to the medial malleolus. Mild tibiotalar joint osteoarthritis seen with joint space loss and subchondral sclerosis. First MTP joint osteoarthritis is with joint space. Ligaments Suboptimally assessed by CT. Muscles and Tendons The muscles surrounding the foot are intact without focal atrophy or tear. The flexor and extensor tendons intact. The plantar fascia and Achilles tendon. Soft tissues Soft tissue swelling.  No ankle joint effusion is noted. IMPRESSION: No acute fracture or dislocation the ankle or foot. Electronically Signed   By: Prudencio Pair M.D.   On: 05/17/2020 21:24   MR FEMUR LEFT WO CONTRAST  Result Date: 05/17/2020 CLINICAL DATA:  Left femoral neck fracture after fall. History of metastatic lung cancer. EXAM: MR OF THE LEFT FEMUR WITHOUT CONTRAST TECHNIQUE: Multiplanar, multisequence MR imaging of the left femur was performed. No intravenous contrast was administered. COMPARISON:  Left hip x-rays from same day. CT abdomen pelvis dated May 12, 2011. FINDINGS: Bones/Joint/Cartilage Acute mildly displaced left femoral neck fracture with apex anterior angulation again noted. No focal bone lesion. No dislocation. No joint effusion. Muscles and Tendons Mild edema in the bilateral adductor muscles.  No muscle atrophy. Soft tissue No fluid collection or  hematoma.  No soft tissue mass. IMPRESSION: 1. Acute mildly displaced left femoral neck fracture again noted. No underlying bone lesion. Electronically Signed   By: Titus Dubin M.D.   On: 05/17/2020 16:12   US BIOPSY (LIVER)  Result Date: 05/15/2020 INDICATION: Right hilar mass, liver lesion adjacent to the gallbladder EXAM: ULTRASOUND GUIDED CORE BIOPSY OF RIGHT LIVER LESION MEDICATIONS: 1% LIDOCAINE LOCAL ANESTHESIA/SEDATION: Fentanyl 50 mcg IV; Versed 1.0 mg IV Moderate Sedation Time:  10 MINUTES The patient was continuously monitored during the procedure by the interventional radiology nurse under my direct supervision. PROCEDURE: The procedure, risks, benefits, and alternatives were explained to the patient. Questions regarding the procedure were encouraged and answered. The patient understands and consents to the procedure. previous imaging reviewed. preliminary ultrasound performed. the solid hypoechoic lesion adjacent to the gallbladder in the right lobe was localized and marked. this is well below the subcostal margin. Under sterile conditions and local anesthesia, a 17 gauge 6.8 cm access was advanced to the lesion under direct ultrasound. Needle position confirmed with ultrasound. 3 18 gauge core biopsies obtained of the lesion under direct ultrasound. Samples were intact and non fragmented. These were placed  in formalin. Needle tract occluded with Gel-Foam. Postprocedure imaging demonstrates no hemorrhage or hematoma. Patient tolerated biopsy well. COMPLICATIONS: None immediate. FINDINGS: Imaging confirms needle placed into the right liver lesion adjacent to the gallbladder for core biopsy IMPRESSION: Successful ultrasound right liver lesion 18 gauge core biopsy Electronically Signed   By: Jerilynn Mages.  Shick M.D.   On: 05/15/2020 15:56   US Venous Img Lower Bilateral (DVT)  Result Date: 05/12/2020 CLINICAL DATA:  Bilateral lower extremity pain.  Evaluate for DVT. EXAM: BILATERAL LOWER EXTREMITY VENOUS  DOPPLER ULTRASOUND TECHNIQUE: Gray-scale sonography with graded compression, as well as color Doppler and duplex ultrasound were performed to evaluate the lower extremity deep venous systems from the level of the common femoral vein and including the common femoral, femoral, profunda femoral, popliteal and calf veins including the posterior tibial, peroneal and gastrocnemius veins when visible. The superficial great saphenous vein was also interrogated. Spectral Doppler was utilized to evaluate flow at rest and with distal augmentation maneuvers in the common femoral, femoral and popliteal veins. COMPARISON:  None. FINDINGS: RIGHT LOWER EXTREMITY Common Femoral Vein: No evidence of thrombus. Normal compressibility, respiratory phasicity and response to augmentation. Saphenofemoral Junction: No evidence of thrombus. Normal compressibility and flow on color Doppler imaging. Profunda Femoral Vein: No evidence of thrombus. Normal compressibility and flow on color Doppler imaging. Femoral Vein: No evidence of thrombus. Normal compressibility, respiratory phasicity and response to augmentation. Popliteal Vein: No evidence of thrombus. Normal compressibility, respiratory phasicity and response to augmentation. Calf Veins: No evidence of thrombus. Normal compressibility and flow on color Doppler imaging. Superficial Great Saphenous Vein: No evidence of thrombus. Normal compressibility. Venous Reflux:  None. Other Findings:  None. LEFT LOWER EXTREMITY Common Femoral Vein: No evidence of thrombus. Normal compressibility, respiratory phasicity and response to augmentation. Saphenofemoral Junction: No evidence of thrombus. Normal compressibility and flow on color Doppler imaging. Profunda Femoral Vein: No evidence of thrombus. Normal compressibility and flow on color Doppler imaging. Femoral Vein: No evidence of thrombus. Normal compressibility, respiratory phasicity and response to augmentation. Popliteal Vein: No evidence of  thrombus. Normal compressibility, respiratory phasicity and response to augmentation. Calf Veins: No evidence of thrombus. Normal compressibility and flow on color Doppler imaging. Superficial Great Saphenous Vein: No evidence of thrombus. Normal compressibility. Venous Reflux:  None. Other Findings:  None. IMPRESSION: No evidence of DVT within either lower extremity. Electronically Signed   By: Sandi Mariscal M.D.   On: 05/12/2020 17:38   DG Chest Port 1 View  Result Date: 06/05/2020 CLINICAL DATA:  Stage IV lung cancer. Abdominal pain. Possible sepsis. EXAM: PORTABLE CHEST 1 VIEW COMPARISON:  One-view chest x-ray 05/17/2020. CT of the chest 05/11/2020 FINDINGS: Heart size normal. Right IJ Port-A-Cath is stable. Atherosclerotic changes are noted at the arch. Right suprahilar mass lesion again noted. The lungs are otherwise clear. No focal airspace disease present. No edema or effusion is present. Changes of COPD are noted. IMPRESSION: 1. Stable right suprahilar mass. 2. No acute cardiopulmonary disease. 3. Atherosclerosis. Electronically Signed   By: San Morelle M.D.   On: 06/05/2020 16:17   DG Foot Complete Left  Result Date: 05/17/2020 CLINICAL DATA:  Initial evaluation for acute trauma, fall. EXAM: LEFT FOOT - COMPLETE 3+ VIEW COMPARISON:  None. FINDINGS: No acute fracture or dislocation. Joint spaces maintained. Mild soft tissue swelling about the midfoot and ankle. 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 IMPRESSION: 1. No acute fracture or dislocation. 2. Mild soft tissue swelling about the midfoot and ankle. Electronically Signed  By: Jeannine Boga M.D.   On: 05/17/2020 04:25   DG C-Arm 1-60 Min  Result Date: 05/18/2020 CLINICAL DATA:  Repair of left proximal femur fracture EXAM: OPERATIVE LEFT HIP WITH PELVIS; DG C-ARM 1-60 MIN COMPARISON:  05/17/2020 left hip radiographs FLUOROSCOPY TIME:  Fluoroscopy Time:  0 minutes 11 seconds Number of Acquired Spot Images: 5  FINDINGS: Multiple nondiagnostic spot fluoroscopic intraoperative left hip radiographs demonstrate postsurgical changes from left total hip arthroplasty. IMPRESSION: Intraoperative fluoroscopic guidance for left total hip arthroplasty. Electronically Signed   By: Ilona Sorrel M.D.   On: 05/18/2020 15:32   DG MINI C-ARM IMAGE ONLY  Result Date: 06/02/2020 There is no interpretation for this exam.  This order is for images obtained during a surgical procedure.  Please See "Surgeries" Tab for more information regarding the procedure.   DG HIP OPERATIVE UNILAT W OR W/O PELVIS LEFT  Result Date: 05/18/2020 CLINICAL DATA:  Repair of left proximal femur fracture EXAM: OPERATIVE LEFT HIP WITH PELVIS; DG C-ARM 1-60 MIN COMPARISON:  05/17/2020 left hip radiographs FLUOROSCOPY TIME:  Fluoroscopy Time:  0 minutes 11 seconds Number of Acquired Spot Images: 5 FINDINGS: Multiple nondiagnostic spot fluoroscopic intraoperative left hip radiographs demonstrate postsurgical changes from left total hip arthroplasty. IMPRESSION: Intraoperative fluoroscopic guidance for left total hip arthroplasty. Electronically Signed   By: Ilona Sorrel M.D.   On: 05/18/2020 15:32   DG Hip Unilat W or Wo Pelvis 2-3 Views Left  Result Date: 05/17/2020 CLINICAL DATA:  Initial evaluation for acute trauma, fall. EXAM: DG HIP (WITH OR WITHOUT PELVIS) 2-3V LEFT COMPARISON:  None. FINDINGS: Acute fracture extends through the left femoral neck with slight superior subluxation. Femoral head remains in normal alignment with the acetabulum. Acetabulum itself appears intact. Bony pelvis intact. Limited views of the right hip demonstrate no acute finding. Underlying osteopenia. Scattered vascular calcifications noted about the inguinal regions bilaterally. IMPRESSION: Acute mildly displaced fracture of the left femoral neck. Electronically Signed   By: Jeannine Boga M.D.   On: 05/17/2020 04:22   IR IMAGING GUIDED PORT INSERTION  Result Date:  05/20/2020 INDICATION: 69 year old male with metastatic non-small cell lung cancer. He presents for port catheter placement. EXAM: IMPLANTED PORT A CATH PLACEMENT WITH ULTRASOUND AND FLUOROSCOPIC GUIDANCE MEDICATIONS: 2 g Ancef; The antibiotic was administered within an appropriate time interval prior to skin puncture. ANESTHESIA/SEDATION: Versed 1 mg IV; Fentanyl 50 mcg IV; Moderate Sedation Time:  20 minutes The patient was continuously monitored during the procedure by the interventional radiology nurse under my direct supervision. FLUOROSCOPY TIME:  0 minutes, 18 seconds (1 mGy) COMPLICATIONS: None immediate. PROCEDURE: The right neck and chest was prepped with chlorhexidine, and draped in the usual sterile fashion using maximum barrier technique (cap and mask, sterile gown, sterile gloves, large sterile sheet, hand hygiene and cutaneous antiseptic). Local anesthesia was attained by infiltration with 1% lidocaine with epinephrine. Ultrasound demonstrated patency of the right internal jugular vein, and this was documented with an image. Under real-time ultrasound guidance, this vein was accessed with a 21 gauge micropuncture needle and image documentation was performed. A small dermatotomy was made at the access site with an 11 scalpel. A 0.018" wire was advanced into the SVC and the access needle exchanged for a 24F micropuncture vascular sheath. The 0.018" wire was then removed and a 0.035" wire advanced into the IVC. An appropriate location for the subcutaneous reservoir was selected below the clavicle and an incision was made through the skin and underlying soft tissues.  The subcutaneous tissues were then dissected using a combination of blunt and sharp surgical technique and a pocket was formed. A single lumen low-profile power injectable portacatheter was then tunneled through the subcutaneous tissues from the pocket to the dermatotomy and the port reservoir placed within the subcutaneous pocket. The venous  access site was then serially dilated and a peel away vascular sheath placed over the wire. The wire was removed and the port catheter advanced into position under fluoroscopic guidance. The catheter tip is positioned in the superior cavoatrial junction. This was documented with a spot image. The portacatheter was then tested and found to flush and aspirate well. The port was flushed with saline followed by 100 units/mL heparinized saline. The pocket was then closed in two layers using first subdermal inverted interrupted absorbable sutures followed by a running subcuticular suture. The epidermis was then sealed with Dermabond. The dermatotomy at the venous access site was also closed with Dermabond. IMPRESSION: Successful placement of a right IJ approach Power Port with ultrasound and fluoroscopic guidance. The catheter is ready for use. Electronically Signed   By: Jacqulynn Cadet M.D.   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     Microbiology: Recent Results (from the past 240 hour(s))  SARS Coronavirus 2 by RT PCR (hospital order,  performed in [facility] hospital lab) Nasopharyngeal Nasopharyngeal Swab     Status: None   Collection Time: 06/02/20  8:29 AM   Specimen: Nasopharyngeal Swab  Result Value Ref Range Status   SARS Coronavirus 2 NEGATIVE NEGATIVE Final    Comment: (NOTE) SARS-CoV-2 target nucleic acids are NOT DETECTED.  The SARS-CoV-2 RNA is generally detectable in upper and lower respiratory specimens during the acute phase of infection. The lowest concentration of SARS-CoV-2 viral copies this assay can detect is 250 copies / mL. A negative result does not preclude SARS-CoV-2 infection and should not be used as the sole basis for treatment or other patient management decisions.  A negative result may occur with improper specimen collection / handling, submission of specimen other than nasopharyngeal swab, presence of viral mutation(s) within the areas targeted by this assay, and inadequate number of viral copies (<250 copies / mL). A negative result must be combined with clinical observations, patient history, and epidemiological information.  Fact Sheet for Patients:   StrictlyIdeas.no  Fact Sheet for Healthcare Providers: BankingDealers.co.za  This test is not yet approved or  cleared by the [place] FDA and has been  authorized for detection and/or diagnosis of SARS-CoV-2 by FDA under an Emergency Use Authorization (EUA).  This EUA will remain in effect (meaning this test can be used) for the duration of the COVID-19 declaration under Section 564(b)(1) of the Act, 21 U.S.C. section 360bbb-3(b)(1), unless the authorization is terminated or revoked sooner.  Performed at [facility], [place] [address] [place], [place] [address]   Culture, blood (single)     Status: None   Collection Time: 06/05/20  3:48 PM   Specimen: BLOOD  Result Value Ref Range Status   Specimen Description BLOOD RIGHT ANTECUBITAL  Final   Special Requests   Final     BOTTLES DRAWN AEROBIC AND ANAEROBIC Blood Culture adequate volume   Culture   Final    NO GROWTH 5 DAYS Performed at [facility], [address] [place], [place] [address]    Report Status 06/10/2020 FINAL  Final  Resp Panel by RT-PCR (Flu A&B, Covid) Nasopharyngeal Swab     Status: None   Collection Time: 06/05/20  4:15 PM   Specimen: Nasopharyngeal Swab; Nasopharyngeal(NP) swabs in vial transport medium  Result Value Ref Range Status   SARS Coronavirus 2 by RT PCR NEGATIVE NEGATIVE Final    Comment: (NOTE) SARS-CoV-2 target nucleic acids are NOT DETECTED.  The SARS-CoV-2 RNA is generally detectable in upper respiratory specimens during the acute phase of infection. The lowest concentration of SARS-CoV-2 viral copies this assay can detect is 138 copies/mL. A negative result does not preclude SARS-Cov-2 infection and should not be used as the sole basis for treatment or other patient management decisions. A negative result may occur with  improper specimen collection/handling, submission of specimen other than nasopharyngeal swab, presence of viral mutation(s) within the areas targeted by this assay, and inadequate number of viral copies(<138 copies/mL). A negative result must be combined with clinical observations, patient history, and epidemiological information. The expected result is Negative.  Fact Sheet for Patients:  EntrepreneurPulse.com.au  Fact Sheet for Healthcare Providers:  IncredibleEmployment.be  This test is no t yet approved or cleared by the [place] FDA and  has been authorized for detection and/or diagnosis of SARS-CoV-2 by FDA under an Emergency Use Authorization (EUA). This EUA will remain  in effect (meaning this test can be used) for the duration of the COVID-19 declaration under Section 564(b)(1) of the Act, 21 U.S.C.section 360bbb-3(b)(1), unless the authorization is terminated  or revoked sooner.        Influenza A by PCR NEGATIVE NEGATIVE Final   Influenza B by PCR NEGATIVE NEGATIVE Final    Comment: (NOTE) The Xpert Xpress SARS-CoV-2/FLU/RSV plus assay is intended as an aid in the diagnosis of influenza from Nasopharyngeal swab specimens and should not be used as a sole basis for treatment. Nasal washings and aspirates are unacceptable for Xpert Xpress SARS-CoV-2/FLU/RSV testing.  Fact Sheet for Patients: EntrepreneurPulse.com.au  Fact Sheet for Healthcare Providers: IncredibleEmployment.be  This test is not yet approved or cleared by the [place] FDA and has been authorized for detection and/or diagnosis of SARS-CoV-2 by FDA under an Emergency Use Authorization (EUA). This EUA will remain in effect (meaning this test can be used) for the duration of the COVID-19 declaration under Section 564(b)(1) of the Act, 21 U.S.C. section 360bbb-3(b)(1), unless the authorization is terminated or revoked.  Performed at [facility], [address], [place], [place] [address]   Blood culture (routine single)     Status: None   Collection Time: 06/05/20  5:22  PM   Specimen: BLOOD  Result Value Ref Range Status   Specimen Description BLOOD RIGHT HAND  Final   Special Requests   Final    BOTTLES DRAWN AEROBIC ONLY Blood Culture adequate volume   Culture   Final    NO GROWTH 5 DAYS Performed at [facility], [address], [place], [place] [address]    Report Status 06/10/2020 FINAL  Final  Urine culture     Status: None   Collection Time: 06/05/20  6:21 PM   Specimen: In/Out Cath Urine  Result Value Ref Range Status   Specimen Description   Final    IN/OUT CATH URINE Performed at [facility], [address] [place], [place] [address]    Special Requests   Final    NONE Performed at [facility], [address] [place], [place] [address]    Culture   Final    NO GROWTH Performed at [facility], [place] [address], [place], [place]  [address]    Report Status 06/07/2020 FINAL  Final     Labs: BNP (last 3 results) No results for input(s): BNP in the last 8760 hours. Basic Metabolic Panel: Recent Labs  Lab 06/05/20 1514 06/06/20 0221 06/09/20 0648  NA 139 139 134*  K 4.0 4.0 3.8  CL 101 105 99  CO2 25 25 25   GLUCOSE 146* 103* 103*  BUN 33* 25* 18  CREATININE 0.80 0.69 0.89  CALCIUM 10.0 8.9 9.0  MG  --  2.5* 2.0  PHOS  --  2.9  --    Liver Function Tests: Recent Labs  Lab 06/05/20 1514 06/06/20 0221  AST 79* 36  ALT 93* 58*  ALKPHOS 291* 210*  BILITOT 0.9 0.8  PROT 7.7 6.0*  ALBUMIN 3.8 2.9*   Recent Labs  Lab 06/05/20 1514  LIPASE 20   Recent Labs  Lab 06/05/20 1514  AMMONIA 17   CBC: Recent Labs  Lab 06/05/20 1514 06/06/20 0444 06/09/20 0648  WBC 16.4* 12.7* 11.2*  NEUTROABS 14.5*  --   --   HGB 10.9* 9.5* 9.3*  HCT 35.0* 31.9* 29.9*  MCV 104.5* 105.3* 101.0*  PLT 421* 378 397   Cardiac Enzymes: No results for input(s): CKTOTAL, CKMB, CKMBINDEX, TROPONINI in the last 168 hours. BNP: Invalid input(s): POCBNP CBG: No results for input(s): GLUCAP in the last 168 hours. D-Dimer No results for input(s): DDIMER in the last 72 hours. Hgb A1c No results for input(s): HGBA1C in the last 72 hours. Lipid Profile No results for input(s): CHOL, HDL, LDLCALC, TRIG, CHOLHDL, LDLDIRECT in the last 72 hours. Thyroid function studies No results for input(s): TSH, T4TOTAL, T3FREE, THYROIDAB in the last 72 hours.  Invalid input(s): FREET3 Anemia work up No results for input(s): VITAMINB12, FOLATE, FERRITIN, TIBC, IRON, RETICCTPCT in the last 72 hours. Urinalysis    Component Value Date/Time   COLORURINE YELLOW 06/05/2020 [place] 06/05/2020 1821   LABSPEC 1.021 06/05/2020 1821   PHURINE 5.0 06/05/2020 1821   GLUCOSEU NEGATIVE 06/05/2020 1821   HGBUR NEGATIVE 06/05/2020 1821   BILIRUBINUR NEGATIVE 06/05/2020 1821   KETONESUR NEGATIVE 06/05/2020 1821   PROTEINUR  NEGATIVE 06/05/2020 1821   NITRITE NEGATIVE 06/05/2020 1821   LEUKOCYTESUR TRACE (A) 06/05/2020 1821   Sepsis Labs Invalid input(s): PROCALCITONIN,  WBC,  LACTICIDVEN Microbiology Recent Results (from the past 240 hour(s))  SARS Coronavirus 2 by RT PCR (hospital order, performed in [facility] hospital lab) Nasopharyngeal Nasopharyngeal Swab  Status: None   Collection Time: 06/02/20  8:29 AM   Specimen: Nasopharyngeal Swab  Result Value Ref Range Status   SARS Coronavirus 2 NEGATIVE NEGATIVE Final    Comment: (NOTE) SARS-CoV-2 target nucleic acids are NOT DETECTED.  The SARS-CoV-2 RNA is generally detectable in upper and lower respiratory specimens during the acute phase of infection. The lowest concentration of SARS-CoV-2 viral copies this assay can detect is 250 copies / mL. A negative result does not preclude SARS-CoV-2 infection and should not be used as the sole basis for treatment or other patient management decisions.  A negative result may occur with improper specimen collection / handling, submission of specimen other than nasopharyngeal swab, presence of viral mutation(s) within the areas targeted by this assay, and inadequate number of viral copies (<250 copies / mL). A negative result must be combined with clinical observations, patient history, and epidemiological information.  Fact Sheet for Patients:   StrictlyIdeas.no  Fact Sheet for Healthcare Providers: BankingDealers.co.za  This test is not yet approved or  cleared by the [place] FDA and has been authorized for detection and/or diagnosis of SARS-CoV-2 by FDA under an Emergency Use Authorization (EUA).  This EUA will remain in effect (meaning this test can be used) for the duration of the COVID-19 declaration under Section 564(b)(1) of the Act, 21 U.S.C. section 360bbb-3(b)(1), unless the authorization is terminated or revoked sooner.  Performed at  [facility], [place] [address] [place], [place] [address]   Culture, blood (single)     Status: None   Collection Time: 06/05/20  3:48 PM   Specimen: BLOOD  Result Value Ref Range Status   Specimen Description BLOOD RIGHT ANTECUBITAL  Final   Special Requests   Final    BOTTLES DRAWN AEROBIC AND ANAEROBIC Blood Culture adequate volume   Culture   Final    NO GROWTH 5 DAYS Performed at [facility], [address] [place], [place] [address]    Report Status 06/10/2020 FINAL  Final  Resp Panel by RT-PCR (Flu A&B, Covid) Nasopharyngeal Swab     Status: None   Collection Time: 06/05/20  4:15 PM   Specimen: Nasopharyngeal Swab; Nasopharyngeal(NP) swabs in vial transport medium  Result Value Ref Range Status   SARS Coronavirus 2 by RT PCR NEGATIVE NEGATIVE Final    Comment: (NOTE) SARS-CoV-2 target nucleic acids are NOT DETECTED.  The SARS-CoV-2 RNA is generally detectable in upper respiratory specimens during the acute phase of infection. The lowest concentration of SARS-CoV-2 viral copies this assay can detect is 138 copies/mL. A negative result does not preclude SARS-Cov-2 infection and should not be used as the sole basis for treatment or other patient management decisions. A negative result may occur with  improper specimen collection/handling, submission of specimen other than nasopharyngeal swab, presence of viral mutation(s) within the areas targeted by this assay, and inadequate number of viral copies(<138 copies/mL). A negative result must be combined with clinical observations, patient history, and epidemiological information. The expected result is Negative.  Fact Sheet for Patients:  EntrepreneurPulse.com.au  Fact Sheet for Healthcare Providers:  IncredibleEmployment.be  This test is no t yet approved or cleared by the [place] FDA and  has been authorized for detection and/or diagnosis of SARS-CoV-2 by FDA under an  Emergency Use Authorization (EUA). This EUA will remain  in effect (meaning this test can be used) for the duration of the COVID-19 declaration under Section 564(b)(1) of the Act, 21 U.S.C.section 360bbb-3(b)(1), unless the authorization  is terminated  or revoked sooner.       Influenza A by PCR NEGATIVE NEGATIVE Final   Influenza B by PCR NEGATIVE NEGATIVE Final    Comment: (NOTE) The Xpert Xpress SARS-CoV-2/FLU/RSV plus assay is intended as an aid in the diagnosis of influenza from Nasopharyngeal swab specimens and should not be used as a sole basis for treatment. Nasal washings and aspirates are unacceptable for Xpert Xpress SARS-CoV-2/FLU/RSV testing.  Fact Sheet for Patients: EntrepreneurPulse.com.au  Fact Sheet for Healthcare Providers: IncredibleEmployment.be  This test is not yet approved or cleared by the [place] FDA and has been authorized for detection and/or diagnosis of SARS-CoV-2 by FDA under an Emergency Use Authorization (EUA). This EUA will remain in effect (meaning this test can be used) for the duration of the COVID-19 declaration under Section 564(b)(1) of the Act, 21 U.S.C. section 360bbb-3(b)(1), unless the authorization is terminated or revoked.  Performed at [facility], [address], [place], [place] [address]   Blood culture (routine single)     Status: None   Collection Time: 06/05/20  5:22 PM   Specimen: BLOOD  Result Value Ref Range Status   Specimen Description BLOOD RIGHT HAND  Final   Special Requests   Final    BOTTLES DRAWN AEROBIC ONLY Blood Culture adequate volume   Culture   Final    NO GROWTH 5 DAYS Performed at [facility], [address], [place], [place] [address]    Report Status 06/10/2020 FINAL  Final  Urine culture     Status: None   Collection Time: 06/05/20  6:21 PM   Specimen: In/Out Cath Urine  Result Value Ref Range Status   Specimen Description   Final    IN/OUT CATH  URINE Performed at [facility], [address], [place], [place] [address]    Special Requests   Final    NONE Performed at [facility], [address] [place], [place] [address]    Culture   Final    NO GROWTH Performed at [facility], [place] [address], [place], [place] [address]    Report Status 06/07/2020 FINAL  Final     Time coordinating discharge: 35 minutes  SIGNED:   Rodena Goldmann, DO [facility] 06/11/2020, 11:05 AM  If 7PM-7AM, please contact night-coverage [email or web address]

## 2020-06-11 NOTE — NC FL2 (Signed)
[place] LEVEL OF CARE SCREENING TOOL     IDENTIFICATION  Patient Name: Brandon Castillo Birthdate: Nov 22, 1950 Sex: male Admission Date (Current Location): 06/05/2020  [facility] and [place] Number:  [employer] and Address:  [facility] [address] [place]      Provider Number: [id number]  Attending Physician Name and Address:  Rodena Goldmann, DO  Relative Name and Phone Number:  STEPHFON, BOVEY (Brother) [phone]    Current Level of Care: SNF Recommended Level of Care: [place] Prior Approval Number:    Date Approved/Denied: 06/11/20 PASRR Number: [id number]  Discharge Plan: SNF    Current Diagnoses: Patient Active Problem List   Diagnosis Date Noted  . Fecal impaction ([place])   . Constipation 06/05/2020  . Closed left radial fracture 06/05/2020  . Macrocytic anemia 06/05/2020  . Insomnia 06/05/2020  . Lactic acidosis 06/05/2020  . Closed left femoral fracture ([place]) 05/17/2020  . Wrist fracture, closed, left, initial encounter 05/17/2020  . Palliative care encounter   . 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 Palliative care by specialist   . Goals of care, counseling/discussion   . Malnutrition of moderate degree 05/12/2020  . Failure to thrive in adult   . Intraabdominal mass   . Generalized abdominal pain 05/11/2020  . Abnormal weight loss 05/11/2020  . Multiple sclerosis ([place])   . 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 Nausea   . Anorexia   . Difficulty urinating   . Leukocytosis   . Sinus tachycardia   . Scrotal sebaceous cyst   . Bilateral renal masses   . Bilateral Adrenal nodules   . Mass of upper lobe of right lung   . Anxiety, generalized   . Elevated LFTs   . Emphysema lung ([place])   . COPD (chronic obstructive pulmonary disease) ([place])   . Former  smoker   . Hyperglycemia     Orientation RESPIRATION BLADDER Height & Weight     Self, Time, Situation, Place  Normal Incontinent, External catheter Weight: 125 lb (56.7 kg) Height:  5\' 9"  (175.3 cm)  BEHAVIORAL SYMPTOMS/MOOD NEUROLOGICAL BOWEL NUTRITION STATUS      Incontinent Diet (Diet Heart Room service appropriate? Yes; Fluid consistency: Thin)  AMBULATORY STATUS COMMUNICATION OF NEEDS Skin   Extensive Assist Verbally Other (Comment) (Wrist incision)                       Personal Care Assistance Level of Assistance  Bathing, Feeding, Dressing Bathing Assistance: Maximum assistance Feeding assistance: Independent Dressing Assistance: Maximum assistance     Functional Limitations Info  Sight, Hearing, Speech Sight Info: Impaired Hearing Info: Adequate Speech Info: Adequate    SPECIAL CARE FACTORS FREQUENCY  PT (By licensed PT), Bowel and bladder program     PT Frequency: 5x   Bowel and Bladder Program Frequency: 5x          Contractures Contractures Info: Not present    Additional Factors Info  Code Status, Allergies Code Status Info: Ful Allergies Info: Demerol           Current Medications (06/11/2020):  This is the current hospital active medication list Current Facility-Administered Medications  Medication Dose Route Frequency Provider Last Rate Last Admin  . 0.9 %  sodium chloride infusion   Intravenous PRN Barton Dubois, MD 10 mL/hr at  06/06/20 1240 1,000 mL at 06/06/20 1240  . amoxicillin-clavulanate (AUGMENTIN) 875-125 MG per tablet 1 tablet  1 tablet Oral Q12H Barton Dubois, MD   1 tablet at 06/11/20 0934  . apixaban (ELIQUIS) tablet 2.5 mg  2.5 mg Oral BID Barton Dubois, MD   2.5 mg at 06/11/20 0935  . Chlorhexidine Gluconate Cloth 2 % PADS 6 each  6 each Topical Daily Adefeso, Oladapo, DO   6 each at 06/11/20 0935  . feeding supplement (ENSURE ENLIVE / ENSURE PLUS) liquid 237 mL  237 mL Oral BID BM Adefeso, Oladapo, DO   237 mL at  06/07/20 1017  . ipratropium-albuterol (DUONEB) 0.5-2.5 (3) MG/3ML nebulizer solution 3 mL  3 mL Nebulization Q4H PRN Adefeso, Oladapo, DO      . linaclotide (LINZESS) capsule 72 mcg  72 mcg Oral QAC breakfast Barton Dubois, MD      . LORazepam (ATIVAN) tablet 1 mg  1 mg Oral QHS PRN Barton Dubois, MD      . morphine 2 MG/ML injection 2 mg  2 mg Intravenous Q4H PRN Barton Dubois, MD   2 mg at 06/11/20 0640  . morphine 2 MG/ML injection 2 mg  2 mg Intravenous Once Barton Dubois, MD      . ondansetron [facility]) injection 4 mg  4 mg Intravenous Q6H PRN Adefeso, Oladapo, DO      . pantoprazole (PROTONIX) EC tablet 40 mg  40 mg Oral Daily Adefeso, Oladapo, DO   40 mg at 06/11/20 0934  . polyethylene glycol (MIRALAX / GLYCOLAX) packet 17 g  17 g Oral Daily Adefeso, Oladapo, DO   17 g at 06/11/20 0935  . traZODone (DESYREL) tablet 50 mg  50 mg Oral QHS PRN Barton Dubois, MD         Discharge Medications: Please see discharge summary for a list of discharge medications.  Relevant Imaging Results:  Relevant Lab Results:   Additional Information Pt SSN: [id number]  Natasha Bence, LCSW

## 2020-06-12 ENCOUNTER — Inpatient Hospital Stay (HOSPITAL_COMMUNITY): Payer: Medicare HMO

## 2020-06-12 DIAGNOSIS — R1084 Generalized abdominal pain: Secondary | ICD-10-CM | POA: Diagnosis not present

## 2020-06-12 NOTE — Care Management Important Message (Signed)
Important Message  Patient Details  Name: Brandon Castillo MRN: [id number] Date of Birth: 07/12/1951   Medicare Important Message Given:  Yes     Tommy Medal 06/12/2020, 12:29 PM

## 2020-06-12 NOTE — TOC Progression Note (Signed)
Transition of Care [facility]) - Progression Note    Patient Details  Name: DEANDRAE WAJDA MRN: [id number] Date of Birth: 07/04/1951  Transition of Care [facility]) CM/SW Contact  Boneta Lucks, RN Phone Number: 06/12/2020, 10:50 AM  Clinical Narrative:   [facility] asking for vaccine status of Patient, per brother [name], patient's MD advised them not to get vaccination with his MS. Brother not aware patient would have to quarantine for 2 weeks. [name] is very upset and tearful. Patient is blind and has MS and he does not want to be away from him. Per Marianna Fuss They can not take patient, she will cancel INS Auth.  TOC spoke with [name] and Billie Lade will accept patient and allow [name] to visit. [name] is accepting bed offer. Jackelyn Poling is starting INS Auth. MD and RN updated.TOC to follow.    Expected Discharge Plan: [place] Barriers to Discharge: Continued Medical Work up  Expected Discharge Plan and Services Expected Discharge Plan: [place] arrangements for the past 2 months: Single Family Home Expected Discharge Date: 06/11/20                  [facility] Agency: [place] ([place])    Readmission Risk Interventions Readmission Risk Prevention Plan 06/10/2020  Transportation Screening Complete  HRI or [place] Complete  Social Work Consult for [place] Planning/Counseling Complete  Palliative Care Screening Complete  Medication Review [employer]) Complete  Some recent data might be hidden

## 2020-06-12 NOTE — Plan of Care (Signed)

## 2020-06-12 NOTE — Progress Notes (Signed)
Patient seen and evaluated today with no new complaints or concerns otherwise noted.  Sister-in-law at bedside and helping assist with breakfast.  TOC assisting with placement to SNF at [facility].  Please refer to discharge summary dictated 06/11/2020.  Total care time: 15 minutes.

## 2020-06-13 DIAGNOSIS — R1084 Generalized abdominal pain: Secondary | ICD-10-CM | POA: Diagnosis not present

## 2020-06-13 NOTE — Plan of Care (Signed)

## 2020-06-13 NOTE — Progress Notes (Signed)
Patient seen and evaluated today with no new complaints or concerns noted.  No acute overnight events noted.  Patient is awaiting placement to SNF at [facility].  Please refer to discharge summary dictated 06/11/2020.  Tried calling brother on 12/3 with no response.  Total care time: 15 minutes.

## 2020-06-14 DIAGNOSIS — R1084 Generalized abdominal pain: Secondary | ICD-10-CM | POA: Diagnosis not present

## 2020-06-14 NOTE — Plan of Care (Signed)

## 2020-06-14 NOTE — Progress Notes (Signed)
Patient seen and evaluated today with no new complaints or concerns noted. Patient examined at bedside and resting comfortably.  Please refer to discharge summary dictated 06/11/2020.  He is still awaiting insurance authorization for [employer].  Tried calling brother on 12/4 with no response.  Total care time: 20 minutes.

## 2020-06-14 NOTE — Progress Notes (Signed)
Physical Therapy Treatment Patient Details Name: Brandon Castillo MRN: [id number] DOB: [birth year] Today's Date: 06/14/2020    History of Present Illness Brandon Castillo is a 69 y.o. 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  He denies nausea, vomiting, fever, chills.    PT Comments    Patient limited to sitting up at bedside and completing a few exercises before having to lie down due to discomfort in peri-anal area secondary to loose stools per patient.  Patient declined out bed activity secondary to generalized pain all over.  Patient will benefit from continued physical therapy in hospital and recommended venue below to increase strength, balance, endurance for safe ADLs and gait.   Follow Up Recommendations  SNF;Supervision for mobility/OOB;Supervision - Intermittent     Equipment Recommendations  None recommended by PT    Recommendations for Other Services       Precautions / Restrictions Precautions Precautions: Fall Precaution Comments: Legally blind in both eyes Required Braces or Orthoses: Other Brace;Splint/Cast Other Brace: L forearm long splint, fingers exposed Restrictions Weight Bearing  Restrictions: Yes LUE Weight Bearing: Non weight bearing LLE Weight Bearing: Weight bearing as tolerated    Mobility  Bed Mobility Overal bed mobility: Needs Assistance Bed Mobility: Supine to Sit;Sit to Supine     Supine to sit: Min assist Sit to supine: Supervision;Min guard   General bed mobility comments: labored movement, diffiuclty supine to sitting due to generalized pain all over body  Transfers                    Ambulation/Gait                 Stairs             Wheelchair Mobility    Modified Rankin (Stroke Patients Only)       Balance Overall balance assessment: Needs assistance Sitting-balance support: Feet supported;No upper extremity supported Sitting balance-Leahy Scale: Good Sitting balance - Comments: seated at EOB                                    Cognition Arousal/Alertness: Awake/alert Behavior During Therapy: WFL for tasks assessed/performed Overall Cognitive Status: Within Functional Limits for tasks assessed                                        Exercises General Exercises - Lower Extremity Long Arc Quad: Seated;AROM;Strengthening;Both;5 reps Toe Raises: Seated;AROM;Strengthening;Both;10 reps Heel Raises: Seated;AROM;Strengthening;Both;10 reps    General Comments        Pertinent Vitals/Pain Pain Assessment: Faces Faces Pain Scale: Hurts little more Pain Location:  generalized pain all over body Pain Descriptors / Indicators: Aching;Sore Pain Intervention(s): Limited activity within patient's tolerance;Monitored during session    Home Living                      Prior Function            PT Goals (current goals can now be found in the care plan section) Acute Rehab PT Goals Patient Stated Goal: Return to brother's home after rehab PT Goal Formulation: With patient/family Time For Goal Achievement: 06/23/20 Potential to Achieve Goals: Good Progress towards PT  goals: Progressing toward goals    Frequency    Min 3X/week      PT Plan Current plan remains appropriate    Co-evaluation              AM-PAC PT "6 Clicks" Mobility   Outcome Measure  Help needed turning from your back to your side while in a flat bed without using bedrails?: A Little Help needed moving from lying on your back to sitting on the side of a flat bed without using bedrails?: A Little Help needed moving to and from a bed to a chair (including a wheelchair)?: A Little Help needed standing up from a chair using your arms (e.g., wheelchair or bedside chair)?: A Little Help needed to walk in hospital room?: A Lot Help needed climbing 3-5 steps with a railing? : A Lot 6 Click Score: 16    End of Session   Activity Tolerance: Patient tolerated treatment well;Patient limited by fatigue;Patient limited by pain Patient left: in bed;with call bell/phone within reach Nurse Communication: Mobility status PT Visit Diagnosis: History of falling (Z91.81);Unsteadiness on feet (R26.81);Other abnormalities of gait and mobility (R26.89);Muscle weakness (generalized) (M62.81)     Time: 1165-7903 PT Time Calculation (min) (ACUTE ONLY): 14 min  Charges:  $Therapeutic Activity: 8-22 mins                     12:00 PM, 06/14/20 Lonell Grandchild, MPT Physical Therapist with [facility] 336 [phone] office [phone] mobile phone

## 2020-06-15 DIAGNOSIS — R1084 Generalized abdominal pain: Secondary | ICD-10-CM | POA: Diagnosis not present

## 2020-06-15 LAB — BASIC METABOLIC PANEL
Anion gap: 8 (ref 5–15)
BUN: 22 mg/dL (ref 8–23)
CO2: 27 mmol/L (ref 22–32)
Calcium: 9.2 mg/dL (ref 8.9–10.3)
Chloride: 98 mmol/L (ref 98–111)
Creatinine, Ser: 0.9 mg/dL (ref 0.61–1.24)
GFR, Estimated: >60 mL/min (ref >60)
Glucose, Bld: 150 mg/dL — ABNORMAL HIGH (ref 70–99)
Potassium: 4 mmol/L (ref 3.5–5.1)
Sodium: 133 mmol/L — ABNORMAL LOW (ref 135–145)

## 2020-06-15 MED ORDER — LACTULOSE 10 GM/15ML PO SOLN
30.0000 g | Freq: Once | ORAL | Status: AC
  Administered 2020-06-15: 30 g via ORAL
  Filled 2020-06-15: qty 60

## 2020-06-15 MED ORDER — SODIUM CHLORIDE 0.9 % IV SOLN: INTRAVENOUS | Status: DC

## 2020-06-15 MED ORDER — SORBITOL 70 % SOLN
960.0000 mL | TOPICAL_OIL | Freq: Once | ORAL | Status: AC
  Administered 2020-06-15: 960 mL via RECTAL
  Filled 2020-06-15: qty 473

## 2020-06-15 MED ORDER — MORPHINE SULFATE (PF) 2 MG/ML IV SOLN
1.0000 mg | INTRAVENOUS | Status: DC | PRN
  Administered 2020-06-15 – 2020-06-19 (×16): 1 mg via INTRAVENOUS
  Filled 2020-06-15 (×17): qty 1

## 2020-06-15 MED ORDER — ZINC OXIDE 40 % EX OINT
TOPICAL_OINTMENT | Freq: Every day | CUTANEOUS | Status: DC
  Administered 2020-06-18: 1 via TOPICAL
  Filled 2020-06-15: qty 57

## 2020-06-15 NOTE — Progress Notes (Signed)
Patient bladder scanned per order and bladder scan reveals 200 cc urine returned. Foley catheter irrigated with 110 cc dark urine returned. Patient tolerated procedure well.

## 2020-06-15 NOTE — Progress Notes (Signed)
Patient seen and evaluated this AM.  He was noted to have some dark urine output in his Foley catheter with suprapubic fullness.  Bladder scan reveals 200 mL in bladder and after irrigation, 110 cc of dark urine was returned.  He tolerated the procedure well.  No other acute overnight events noted.  BMP appears stable.  There appear to be no signs of dehydration.  He is in stable condition for discharge once bed available.  Please refer to discharge summary dictated 06/11/2020 for full details.  Total care time: 25 minutes.

## 2020-06-15 NOTE — Progress Notes (Signed)
SMOG enema completed. Attempts to disimpact patient with little success. Patient has hardened stool in rectum. Patient had small dark green bowel movement post SMOG.

## 2020-06-16 ENCOUNTER — Encounter (HOSPITAL_COMMUNITY): Payer: Self-pay | Admitting: Orthopaedic Surgery

## 2020-06-16 ENCOUNTER — Inpatient Hospital Stay (HOSPITAL_COMMUNITY): Payer: Medicare HMO

## 2020-06-16 LAB — URINALYSIS, MICROSCOPIC (REFLEX)
RBC / HPF: >50 RBC/hpf (ref 0–5)
WBC, UA: >50 WBC/hpf (ref 0–5)

## 2020-06-16 LAB — URINALYSIS, ROUTINE W REFLEX MICROSCOPIC
Bilirubin Urine: NEGATIVE
Glucose, UA: NEGATIVE mg/dL
Nitrite: NEGATIVE

## 2020-06-16 LAB — RESP PANEL BY RT-PCR (FLU A&B, COVID) ARPGX2
Influenza A by PCR: NEGATIVE
Influenza B by PCR: NEGATIVE
SARS Coronavirus 2 by RT PCR: NEGATIVE

## 2020-06-16 MED ORDER — SODIUM CHLORIDE 0.9 % IV SOLN
1.0000 g | INTRAVENOUS | Status: DC
  Administered 2020-06-16 – 2020-06-19 (×4): 1 g via INTRAVENOUS
  Filled 2020-06-16 (×4): qty 10

## 2020-06-16 NOTE — Plan of Care (Signed)

## 2020-06-16 NOTE — Progress Notes (Signed)
PROGRESS NOTE    Brandon Castillo  QIH:[id number] DOB: September 06, 1950 DOA: 06/05/2020 PCP: [name], NP   Brief Narrative:  As per H&P written by Dr. 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 Patient states that he was sleepwalking last night for the first time in his life and today,his brother was worried that he was confused, he complained of abdominal pain and patient states that he only had a small bowel movement yesterday, and was able to pass gas, but he also complained of abdominal pain. He denies nausea, vomiting, fever, chills.  ED Course: In the emergency department, he was hemodynamically stable. Work-up in the ED showed leukocytosis, thrombocytosis, macrocytic anemia, hyperglycemia, transaminitis, lactic acid 3.1.  CT abdomen and pelvis with contrast showed marked wall thickening throughout the transverse colon suspected to be due to ischemia or tumor infiltration. Large amount of stool in the distal sigmoid colon and rectum compatible with constipation was also noted. Chest x-ray showed no acute cardiopulmonary disease and stable right suprahilar mass Patient was empirically treated with IV cefepimeand vancomycin due to presumed intra-abdominal infection, IV hydration was provided. Hospitalist was asked to admit patient for further evaluation and management.  -Patient was admitted with generalized abdominal pain secondary to concern for sepsis  present on admission with infectious colitis noted on abdominal imaging studies. He was initially started on IV Unasyn and then transition to oral Augmentin. During the course of his admission he was noted to have acute urinary retention and bladder distention for which Foley catheter was placed for decompression and he failed voiding trial on 11/29 and therefore catheter was replaced.  He has now developed hematuria and appears to have UTI for which she has been empirically started on Rocephin with cultures pending.  He is also noted to require some IV fluids.   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 (Pleural/Chest/Abdomen/Liver)/Non-Small Cell Carcinoma)    Constipation   Closed left radial fracture   Macrocytic anemia   Insomnia   Lactic acidosis   Fecal impaction (HCC)   Generalized abdominal pain -Appears to be secondary to colitis and stool burden/constipation -Appears persistent and has finished course of Augmentin -Lactulose given on 12/5 with bowel movement noted -Ultrasound abdomen ordered and pending -May be related to UTI as below  urinary retention/bladder distention signs of UTI and hematuria -Started on Rocephin -Urine cultures obtained and pending -Foley catheter placed for decompression. -With high concern for neurogenic bladder; patient will need follow up with urology as an outpatient. 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 -Appears to be stable overall.  Leukocytosis -In the setting of  dehydration/colitis -WBCs  has trended down and are essentially within normal limits at this point -Plan to recheck in a.m.  Anxiety, generalized/insomnia -Continue the use of as needed Ativan and trazodone.  closed left radial fracture -Stable and with immobilizer in place -Continue as needed analgesics. -Per records and patient reports, immobilizer cast to come off in about 2 weeks.  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  COPD/emphysema -No wheezing or complaining of shortness of breath currently -Continue bronchodilators as needed.  Elevated LFTs -In the setting of most likely lung cancer metastasis to the liver -Continue to follow trend -Continue to maintain adequate hydration orally. -Liver ultrasound to be pursued as an outpatient by the oncology service discretion..  Sepsis present on admission -Patient with positive SIRS criteria on presentation and having source of infection colitis as appreciated on his abdominal imaging studies -Will continue antibiotics, using Rocephin now for UTI -Continue supportive care and follow resolution of sepsis features -White blood cells has trended down and essentially within normal limits; patient has remained afebrile. -No nausea vomiting reported.  physical deconditioning -Plan for SNF on discharge  DVT prophylaxis: Eliquis Code Status: DNR Family Communication: Discussed with brother on phone 12/6 Disposition Plan:  Status is: Inpatient  Remains inpatient appropriate because:IV treatments appropriate due to intensity of illness or inability to take PO and Inpatient level of care appropriate due to severity of illness   Dispo: The patient is from: SNF              Anticipated d/c is to: SNF              Anticipated d/c date is: 1 day              Patient currently is not medically stable to d/c.  He requires ongoing IV fluid and Rocephin with  further evaluation of UTI.  Consultants:   None  Procedures:   See below  Antimicrobials:  Anti-infectives (From admission, onward)   Start     Dose/Rate Route Frequency Ordered Stop   06/16/20 1200  cefTRIAXone (ROCEPHIN) 1 g in sodium chloride 0.9 % 100 mL IVPB        1 g 200 mL/hr over 30 Minutes Intravenous Every 24 hours 06/16/20 1105     06/11/20 0000  amoxicillin-clavulanate (AUGMENTIN) 875-125 MG tablet        1 tablet Oral Every 12 hours 06/11/20 1058 06/14/20 2359   06/08/20 1045  amoxicillin-clavulanate (AUGMENTIN) 875-125 MG per tablet 1 tablet        1 tablet Oral Every 12 hours 06/08/20 0959 06/13/20 0931   06/06/20 0900  Ampicillin-Sulbactam (UNASYN) 3 g in sodium chloride 0.9 % 100 mL IVPB  Status:  Discontinued        3 g 200 mL/hr over 30 Minutes Intravenous Every 8 hours 06/06/20 0810 06/08/20 0959   06/06/20 0400  vancomycin (VANCOREADY) IVPB 750 mg/150 mL  Status:  Discontinued        750 mg 150 mL/hr over 60 Minutes Intravenous Every 12 hours 06/05/20 1702 06/06/20 0810   06/06/20 0000  ceFEPIme (MAXIPIME) 2 g in sodium chloride 0.9 % 100 mL IVPB  Status:  Discontinued        2 g 200 mL/hr over 30 Minutes Intravenous Every 8 hours 06/05/20 1702 06/06/20 0810   06/05/20 1600  ceFEPIme (MAXIPIME) 2 g in sodium chloride 0.9 % 100 mL IVPB        2  g 200 mL/hr over 30 Minutes Intravenous  Once 06/05/20 1552 06/05/20 1735   06/05/20 1600  vancomycin (VANCOCIN) IVPB 1000 mg/200 mL premix        1,000 mg 200 mL/hr over 60 Minutes Intravenous  Once 06/05/20 1552 06/05/20 2109       Subjective: Patient seen and evaluated today with no new acute complaints or concerns. No acute concerns or events noted overnight.  He is feeling much better after he has had a bowel movement yesterday.  He has signs of UTI noted on urinalysis.  Objective: Vitals:   06/15/20 0539 06/15/20 1400 06/15/20 2118 06/16/20 0537  BP: (!) 143/73 122/75 134/84 128/74  Pulse: 88 87 92 89   Resp: 18 20 20 17   Temp: 98.4 F (36.9 C) 97.7 F (36.5 C) 98 F (36.7 C) 98 F (36.7 C)  TempSrc:   Oral Oral  SpO2: 94% 97% 94% 99%  Weight:      Height:        Intake/Output Summary (Last 24 hours) at 06/16/2020 1155 Last data filed at 06/16/2020 1046 Gross per 24 hour  Intake 1988.08 ml  Output 600 ml  Net 1388.08 ml   Filed Weights   06/05/20 1442  Weight: 56.7 kg    Examination:  General exam: Appears calm and comfortable, blind Respiratory system: Clear to auscultation. Respiratory effort normal. Cardiovascular system: S1 & S2 heard, RRR.  Gastrointestinal system: Abdomen is nondistended, soft and nontender. Central nervous system: Alert and awake Extremities: No edema Skin: No rashes, lesions or ulcers Psychiatry: Judgement and insight appear normal. Mood & affect appropriate. Foley catheter bag with dark and bloody urine    Data Reviewed: I have personally reviewed following labs and imaging studies  CBC: No results for input(s): WBC, NEUTROABS, HGB, HCT, MCV, PLT in the last 168 hours. Basic Metabolic Panel: Recent Labs  Lab 06/15/20 1116  NA 133*  K 4.0  CL 98  CO2 27  GLUCOSE 150*  BUN 22  CREATININE 0.90  CALCIUM 9.2   GFR: Estimated Creatinine Clearance: 63 mL/min (by C-G formula based on SCr of 0.9 mg/dL). Liver Function Tests: No results for input(s): AST, ALT, ALKPHOS, BILITOT, PROT, ALBUMIN in the last 168 hours. No results for input(s): LIPASE, AMYLASE in the last 168 hours. No results for input(s): AMMONIA in the last 168 hours. Coagulation Profile: No results for input(s): INR, PROTIME in the last 168 hours. Cardiac Enzymes: No results for input(s): CKTOTAL, CKMB, CKMBINDEX, TROPONINI in the last 168 hours. BNP (last 3 results) No results for input(s): PROBNP in the last 8760 hours. HbA1C: No results for input(s): HGBA1C in the last 72 hours. CBG: No results for input(s): GLUCAP in the last 168 hours. Lipid Profile: No  results for input(s): CHOL, HDL, LDLCALC, TRIG, CHOLHDL, LDLDIRECT in the last 72 hours. Thyroid Function Tests: No results for input(s): TSH, T4TOTAL, FREET4, T3FREE, THYROIDAB in the last 72 hours. Anemia Panel: No results for input(s): VITAMINB12, FOLATE, FERRITIN, TIBC, IRON, RETICCTPCT in the last 72 hours. Sepsis Labs: No results for input(s): PROCALCITON, LATICACIDVEN in the last 168 hours.  No results found for this or any previous visit (from the past 240 hour(s)).       Radiology Studies: No results found.      Scheduled Meds: . apixaban  2.5 mg Oral BID  . Chlorhexidine Gluconate Cloth  6 each Topical Daily  . feeding supplement  237 mL Oral BID BM  . linaclotide  72 mcg Oral  QAC breakfast  . liver oil-zinc oxide   Topical Daily  .  morphine injection  2 mg Intravenous Once  . pantoprazole  40 mg Oral Daily  . polyethylene glycol  17 g Oral Daily   Continuous Infusions: . sodium chloride 1,000 mL (06/06/20 1240)  . sodium chloride 75 mL/hr at 06/16/20 0847  . cefTRIAXone (ROCEPHIN)  IV       LOS: 11 days    Time spent: 35 minutes    Quiara Killian Darleen Crocker, DO [facility]  If 7PM-7AM, please contact night-coverage [email or web address] 06/16/2020, 11:55 AM

## 2020-06-16 NOTE — Care Management Important Message (Signed)
Important Message  Patient Details  Name: Brandon Castillo MRN: [id number] Date of Birth: 1950/08/19   Medicare Important Message Given:  Yes     Tommy Medal 06/16/2020, 2:15 PM

## 2020-06-17 LAB — BASIC METABOLIC PANEL
Anion gap: 8 (ref 5–15)
BUN: 15 mg/dL (ref 8–23)
CO2: 24 mmol/L (ref 22–32)
Calcium: 8.7 mg/dL — ABNORMAL LOW (ref 8.9–10.3)
Chloride: 102 mmol/L (ref 98–111)
Creatinine, Ser: 0.78 mg/dL (ref 0.61–1.24)
GFR, Estimated: >60 mL/min (ref >60)
Glucose, Bld: 177 mg/dL — ABNORMAL HIGH (ref 70–99)
Potassium: 3.2 mmol/L — ABNORMAL LOW (ref 3.5–5.1)
Sodium: 134 mmol/L — ABNORMAL LOW (ref 135–145)

## 2020-06-17 LAB — CBC
HCT: 28.9 % — ABNORMAL LOW (ref 39.0–52.0)
Hemoglobin: 8.8 g/dL — ABNORMAL LOW (ref 13.0–17.0)
MCH: 30.6 pg (ref 26.0–34.0)
MCHC: 30.4 g/dL (ref 30.0–36.0)
MCV: 100.3 fL — ABNORMAL HIGH (ref 80.0–100.0)
Platelets: 479 10*3/uL — ABNORMAL HIGH (ref 150–400)
RBC: 2.88 MIL/uL — ABNORMAL LOW (ref 4.22–5.81)
RDW: 12.6 % (ref 11.5–15.5)
WBC: 12.6 10*3/uL — ABNORMAL HIGH (ref 4.0–10.5)
nRBC: 0 % (ref 0.0–0.2)

## 2020-06-17 LAB — URINE CULTURE: Culture: NO GROWTH

## 2020-06-17 MED ORDER — POTASSIUM CHLORIDE CRYS ER 20 MEQ PO TBCR
40.0000 meq | EXTENDED_RELEASE_TABLET | Freq: Once | ORAL | Status: AC
  Administered 2020-06-17: 40 meq via ORAL
  Filled 2020-06-17: qty 2

## 2020-06-17 NOTE — Progress Notes (Signed)
PROGRESS NOTE    Brandon Castillo  VHQ:[id number] DOB: 1951-02-04 DOA: 06/05/2020 PCP: [name], NP   Brief Narrative:  As per H&P written by Dr. 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 Patient states that he was sleepwalking last night for the first time in his life and today,his brother was worried that he was confused, he complained of abdominal pain and patient states that he only had a small bowel movement yesterday, and was able to pass gas, but he also complained of abdominal pain. He denies nausea, vomiting, fever, chills.  ED Course: In the emergency department, he was hemodynamically stable. Work-up in the ED showed leukocytosis, thrombocytosis, macrocytic anemia, hyperglycemia, transaminitis, lactic acid 3.1.  CT abdomen and pelvis with contrast showed marked wall thickening throughout the transverse colon suspected to be due to ischemia or tumor infiltration. Large amount of stool in the distal sigmoid colon and rectum compatible with constipation was also noted. Chest x-ray showed no acute cardiopulmonary disease and stable right suprahilar mass Patient was empirically treated with IV cefepimeand vancomycin due to presumed intra-abdominal infection, IV hydration was provided. Hospitalist was asked to admit patient for further evaluation and management.  -Patient was admitted with generalized abdominal pain secondary to concern for sepsis  present on admission with infectious colitis noted on abdominal imaging studies. He was initially started on IV Unasyn and then transition to oral Augmentin. During the course of his admission he was noted to have acute urinary retention and bladder distention for which Foley catheter was placed for decompression and he failed voiding trial on 11/29 and therefore catheter was replaced.  He has now developed hematuria and appears to have UTI for which she has been empirically started on Rocephin with cultures negative.  He is also noted to require some IV fluids.  He is also noted to have some intermittent sleepiness and agitation and is not stable for discharge.  Bedtime Ativan discontinued which appears to be exacerbating symptoms.  Eliquis held for now due to persistent hematuria.  He is noted to have moderate right-sided have hydronephrosis and case was discussed with urology Dr. Louis Meckel who does not recommend any urological procedures given patient condition.  Palliative care consulted and evaluation pending.  Family notified.  Likely will need discharge to home with hospice or hospice facility.  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  Discussed case with Dr. Louis Meckel who does not recommend any surgical procedures.  Palliative care consultation pending.  Likely need for hospice on discharge.  urinary retention/bladder distention signs of UTI and hematuria -Started on Rocephin -Urine cultures obtained with no growth -Foley catheter placed for decompression. 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 -Appears to be stable overall.  Leukocytosis -In the setting of dehydration/colitis/UTI -WBCs has trended down and are essentially within normal limits at this point -Plan to recheck in a.m.  Anxiety, generalized/insomnia -Continue the use of as needed trazodone and hold Ativan due to some altered mentation  closed left radial fracture -Stable and with immobilizer in place -Continue as needed analgesics. -Per records and patient reports,immobilizer cast to come off in about 2 weeks.  stage IV squamous cell carcinoma of right lung with metastases -No need for further oncology follow-up, palliative care consulted  COPD/emphysema -No wheezing or complaining of shortness of breath currently -Continue bronchodilators as needed.  Elevated LFTs -In the setting of most likely lung cancer metastasis to the liver -Continue to follow trend -Continue to maintain adequate hydration orally. -Liver ultrasound to be pursued as an outpatient by the oncology service discretion..  Sepsis present on admission -Patient with positive SIRS criteria on presentation and having source of infection colitis as appreciated on his abdominal imaging studies -Will continue antibiotics,using Rocephin now for UTI -Continue supportive care and  follow resolution of sepsis features -White blood cells has trended down and essentially within normal limits;patient has remained afebrile. -No nausea vomiting reported.  physical deconditioning -Plan for SNF on discharge  DVT prophylaxis: Eliquis discontinued 12/7 Code Status: DNR Family Communication: Discussed with brother on phone 12/7 Disposition Plan:  Status is: Inpatient  Remains inpatient appropriate because:IV treatments appropriate due to intensity of illness or inability to take PO and Inpatient level of care appropriate due to severity of illness   Dispo: The patient is from: SNF  Anticipated d/c is to:  Home hospice or hospice facility  Anticipated d/c date is: 1-2 days  Patient currently is not medically stable to d/c.  He requires ongoing IV fluid and Rocephin with further evaluation of UTI.  Continues to have some AMS and palliative care consultation is pending.  Consultants:   Discussed with urology Dr. Louis Meckel 12/7  Palliative care  Procedures:   See below  Antimicrobials:  Anti-infectives (From admission, onward)   Start     Dose/Rate Route Frequency Ordered Stop   06/16/20 1200  cefTRIAXone (ROCEPHIN) 1 g in sodium chloride 0.9 % 100 mL IVPB        1 g 200 mL/hr over 30 Minutes Intravenous Every 24 hours 06/16/20 1105     06/11/20 0000  amoxicillin-clavulanate (AUGMENTIN) 875-125 MG tablet        1 tablet Oral Every 12 hours 06/11/20 1058 06/14/20 2359   06/08/20 1045  amoxicillin-clavulanate (AUGMENTIN) 875-125 MG per tablet 1 tablet        1 tablet Oral Every 12 hours 06/08/20 0959 06/13/20 0931   06/06/20 0900  Ampicillin-Sulbactam (UNASYN) 3 g in sodium chloride 0.9 % 100 mL IVPB  Status:  Discontinued        3 g 200 mL/hr over 30 Minutes Intravenous Every 8 hours 06/06/20  6812 06/08/20 0959   06/06/20 0400  vancomycin (VANCOREADY) IVPB 750 mg/150 mL  Status:  Discontinued        750 mg 150 mL/hr over  60 Minutes Intravenous Every 12 hours 06/05/20 1702 06/06/20 0810   06/06/20 0000  ceFEPIme (MAXIPIME) 2 g in sodium chloride 0.9 % 100 mL IVPB  Status:  Discontinued        2 g 200 mL/hr over 30 Minutes Intravenous Every 8 hours 06/05/20 1702 06/06/20 0810   06/05/20 1600  ceFEPIme (MAXIPIME) 2 g in sodium chloride 0.9 % 100 mL IVPB        2 g 200 mL/hr over 30 Minutes Intravenous  Once 06/05/20 1552 06/05/20 1735   06/05/20 1600  vancomycin (VANCOCIN) IVPB 1000 mg/200 mL premix        1,000 mg 200 mL/hr over 60 Minutes Intravenous  Once 06/05/20 1552 06/05/20 2109      Subjective: Patient seen and evaluated today with some somnolence noted this morning.  He was noted to be quite agitated overnight.  He still continues to have some hematuria.  Objective: Vitals:   06/16/20 0537 06/16/20 1455 06/16/20 2053 06/17/20 0532  BP: 128/74 112/62 136/77 (!) 160/96  Pulse: 89 76 81 (!) 102  Resp: 17 17 20 20   Temp: 98 F (36.7 C) (!) 97.2 F (36.2 C) 98.3 F (36.8 C) 97.6 F (36.4 C)  TempSrc: Oral  Oral Oral  SpO2: 99% 100% 97% 98%  Weight:      Height:        Intake/Output Summary (Last 24 hours) at 06/17/2020 1343 Last data filed at 06/17/2020 0944 Gross per 24 hour  Intake 1569.05 ml  Output 1125 ml  Net 444.05 ml   Filed Weights   06/05/20 1442  Weight: 56.7 kg    Examination:  General exam: Appears somnolent Respiratory system: Clear to auscultation. Respiratory effort normal. Cardiovascular system: S1 & S2 heard, RRR.  Gastrointestinal system: Abdomen is nondistended, soft and nontender.  Central nervous system: Noted to be somnolent Extremities: No edema Skin: No rashes, lesions or ulcers Psychiatry: Cannot be assessed Foley with dark, bloody urine output noted    Data Reviewed: I have personally reviewed following labs and imaging studies  CBC: Recent Labs  Lab 06/17/20 1050  WBC 12.6*  HGB 8.8*  HCT 28.9*  MCV 100.3*  PLT 751*   Basic Metabolic  Panel: Recent Labs  Lab 06/15/20 1116 06/17/20 1050  NA 133* 134*  K 4.0 3.2*  CL 98 102  CO2 27 24  GLUCOSE 150* 177*  BUN 22 15  CREATININE 0.90 0.78  CALCIUM 9.2 8.7*   GFR: Estimated Creatinine Clearance: 70.9 mL/min (by C-G formula based on SCr of 0.78 mg/dL). Liver Function Tests: No results for input(s): AST, ALT, ALKPHOS, BILITOT, PROT, ALBUMIN in the last 168 hours. No results for input(s): LIPASE, AMYLASE in the last 168 hours. No results for input(s): AMMONIA in the last 168 hours. Coagulation Profile: No results for input(s): INR, PROTIME in the last 168 hours. Cardiac Enzymes: No results for input(s): CKTOTAL, CKMB, CKMBINDEX, TROPONINI in the last 168 hours. BNP (last 3 results) No results for input(s): PROBNP in the last 8760 hours. HbA1C: No results for input(s): HGBA1C in the last 72 hours. CBG: No results for input(s): GLUCAP in the last 168 hours. Lipid Profile: No results for input(s): CHOL, HDL, LDLCALC, TRIG, CHOLHDL, LDLDIRECT in the last 72 hours. Thyroid Function Tests: No results for input(s): TSH, T4TOTAL,  FREET4, T3FREE, THYROIDAB in the last 72 hours. Anemia Panel: No results for input(s): VITAMINB12, FOLATE, FERRITIN, TIBC, IRON, RETICCTPCT in the last 72 hours. Sepsis Labs: No results for input(s): PROCALCITON, LATICACIDVEN in the last 168 hours.  Recent Results (from the past 240 hour(s))  Culture, Urine     Status: None   Collection Time: 06/16/20  9:14 AM   Specimen: Urine, Clean Catch  Result Value Ref Range Status   Specimen Description   Final    URINE, CLEAN CATCH Performed at [facility], [address] [place], [place] [address]    Special Requests   Final    NONE Performed at [facility], [address] [place], [place] [address]    Culture   Final    NO GROWTH Performed at [facility], [place] [address] [place], [place] [address]    Report Status 06/17/2020 FINAL  Final  Resp Panel by RT-PCR (Flu A&B, Covid)  Nasopharyngeal Swab     Status: None   Collection Time: 06/16/20 11:05 AM   Specimen: Nasopharyngeal Swab; Nasopharyngeal(NP) swabs in vial transport medium  Result Value Ref Range Status   SARS Coronavirus 2 by RT PCR NEGATIVE NEGATIVE Final    Comment: (NOTE) SARS-CoV-2 target nucleic acids are NOT DETECTED.  The SARS-CoV-2 RNA is generally detectable in upper respiratory specimens during the acute phase of infection. The lowest concentration of SARS-CoV-2 viral copies this assay can detect is 138 copies/mL. A negative result does not preclude SARS-Cov-2 infection and should not be used as the sole basis for treatment or other patient management decisions. A negative result may occur with  improper specimen collection/handling, submission of specimen other than nasopharyngeal swab, presence of viral mutation(s) within the areas targeted by this assay, and inadequate number of viral copies(<138 copies/mL). A negative result must be combined with clinical observations, patient history, and epidemiological information. The expected result is Negative.  Fact Sheet for Patients:  EntrepreneurPulse.com.au  Fact Sheet for Healthcare Providers:  IncredibleEmployment.be  This test is no t yet approved or cleared by the [place] FDA and  has been authorized for detection and/or diagnosis of SARS-CoV-2 by FDA under an Emergency Use Authorization (EUA). This EUA will remain  in effect (meaning this test can be used) for the duration of the COVID-19 declaration under Section 564(b)(1) of the Act, 21 U.S.C.section 360bbb-3(b)(1), unless the authorization is terminated  or revoked sooner.       Influenza A by PCR NEGATIVE NEGATIVE Final   Influenza B by PCR NEGATIVE NEGATIVE Final    Comment: (NOTE) The Xpert Xpress SARS-CoV-2/FLU/RSV plus assay is intended as an aid in the diagnosis of influenza from Nasopharyngeal swab specimens and should not be  used as a sole basis for treatment. Nasal washings and aspirates are unacceptable for Xpert Xpress SARS-CoV-2/FLU/RSV testing.  Fact Sheet for Patients: EntrepreneurPulse.com.au  Fact Sheet for Healthcare Providers: IncredibleEmployment.be  This test is not yet approved or cleared by the [place] FDA and has been authorized for detection and/or diagnosis of SARS-CoV-2 by FDA under an Emergency Use Authorization (EUA). This EUA will remain in effect (meaning this test can be used) for the duration of the COVID-19 declaration under Section 564(b)(1) of the Act, 21 U.S.C. section 360bbb-3(b)(1), unless the authorization is terminated or revoked.  Performed at [facility], [address], [place], [place] [address]          Radiology Studies: US Abdomen Complete  Result Date: 06/16/2020 CLINICAL DATA:  Abdominal pain, new diagnosis of stage IV lung cancer, abnormal LFTs EXAM: ABDOMEN ULTRASOUND COMPLETE COMPARISON:  CT abdomen and pelvis 06/05/2020 FINDINGS: Gallbladder: Normally distended without stones or wall thickening. No pericholecystic fluid or sonographic Murphy sign. Common bile duct: Diameter: 4 mm, normal Liver: Mildly heterogeneous echogenicity. Mass identified within RIGHT lobe liver 2.4 x 2.0 x 2.9 cm. No additional hepatic masses. Portal vein is patent on color Doppler imaging with normal direction of blood flow towards the liver. IVC: Normal appearance Pancreas: Obscured by bowel gas Spleen: Normal appearance, 4.5 cm length Right Kidney: Length: 12.8 cm. Normal cortical thickness. Increased cortical echogenicity. Moderate RIGHT hydronephrosis. Large heterogeneous solid mass at inferior pole 7.0 x 6.2 x 6.1 cm corresponding to tumor on CT. Additional smaller mass at upper pole less well demonstrated due to shadowing, approximately 3.1 x 3.7 cm on sagittal image. Left Kidney: Length: 10.8 cm. Cortical thickening mid kidney. Area of  somewhat masslike prominence at mid LEFT kidney 5.1 cm greatest diameter but may be an artifact, without definite mass at this position seen on prior CT. Small nodules at upper pole of LEFT kidney on prior CT exam is not delineated due to shadowing by bowel gas. Abdominal aorta: Normal caliber proximal to mid, obscured distally by bowel gas Other findings: No free fluid IMPRESSION: Moderate RIGHT hydronephrosis. Two RIGHT renal masses consistent with tumor, larger 7.0 cm diameter at inferior pole. Mass in RIGHT lobe liver 2.9 cm greatest size, corresponding to tumor seen on CT. Masslike area within the LEFT kidney does not demonstrate a corresponding lesion on CT, suspect represents artifact rather than tumor though could be characterized by MR if clinically indicated. Pancreas obscured by bowel gas. Electronically Signed   By: Lavonia Dana M.D.   On: 06/16/2020 12:16        Scheduled Meds: . Chlorhexidine Gluconate Cloth  6 each Topical Daily  . feeding supplement  237 mL Oral BID BM  . linaclotide  72 mcg Oral QAC breakfast  . liver oil-zinc oxide   Topical Daily  .  morphine injection  2 mg Intravenous Once  . pantoprazole  40 mg Oral Daily  . polyethylene glycol  17 g Oral Daily  . potassium chloride  40 mEq Oral Once   Continuous Infusions: . sodium chloride 1,000 mL (06/06/20 1240)  . sodium chloride 75 mL/hr at 06/16/20 2334  . cefTRIAXone (ROCEPHIN)  IV 1 g (06/17/20 1305)     LOS: 12 days    Time spent: 35 minutes    Shaunte Tuft D Manuella Ghazi, DO [facility]  If 7PM-7AM, please contact night-coverage [email or web address] 06/17/2020, 1:43 PM

## 2020-06-17 NOTE — Plan of Care (Signed)

## 2020-06-17 NOTE — Plan of Care (Signed)
  Problem: Education: Goal: Knowledge of General Education information will improve Description Including pain rating scale, medication(s)/side effects and non-pharmacologic comfort measures Outcome: Progressing   

## 2020-06-17 NOTE — Progress Notes (Signed)
Initial Nutrition Assessment  DOCUMENTATION CODES:   Severe malnutrition in context of acute illness/injury  INTERVENTION:  Increase Ensure Enlive po TID, each supplement provides 350 kcal and 20 grams of protein (likes vanilla)  Magic cup BID with meals, each supplement provides 290 kcal and 9 grams of protein  Downgrade diet to DYS 2 (fine chop) for ease of intake  Strategies for increasing calories and proteins   NUTRITION DIAGNOSIS:   Severe Malnutrition related to chronic illness (stage IV non small cell lung cancer metastatic to liver and abdomen) as evidenced by severe fat depletion, moderate muscle depletion, severe muscle depletion, percent weight loss, per patient/family report, energy intake < or equal to 75% for > or equal to 1 month.    GOAL:   Patient will meet greater than or equal to 90% of their needs    MONITOR:   PO intake, Weight trends, Labs, I & O's, Supplement acceptance, Skin  REASON FOR ASSESSMENT:   LOS    ASSESSMENT:  69 year old male with history significant for vision loss/blindness related to underlying MS, COPD, emphysema, anxiety disorder, recent distal radius fracture s/p ORIF who was recently diagnosed with stage IV primary non-small cell lung cancer metastatic to liver and abdomen presented with abdominal pain and confusion. Patient admitted with generalized abdominal pain secondary to colitis and stool burden.  11/25-admit  Patient awake sitting up in bed this afternoon, brother present in room. He reports doing well today, says he ate most of his lunch (chicken, broccoli, and pasta). He has been drinking Ensure in between meals, likes vanilla flavor. Patient endorses some chewing difficulties, agreeable to dysphagia 2 (fine chop) for ease of intake. Patient reports he has a good appetite and eats well at home, however after discussion with brother, pt eating bites of meals and drinking 1-2 Ensure at baseline and concerned pt is not strong  enough to began chemotherapy treatments next week. Pt recalls usually weighing 150 lbs, per chart he weighed 68 kg (149.6 lbs) on 10/31 and on 11/25 he weighed 56.7 kg (124.74 lbs). This indicates ~25 lb (16.6%) decrease in 1 month; significant. No new weights this admission, will order daily weights to assess current trends. Given trends noted severe fat/muscle depletions on exam, as well as metastatic non-small cell lung cancer, pt meets criteria for severe malnutrition. RD educated the importance of adequate nutrition, recommended small frequent meals and snacks throughout the day, offered ideas on ways to increase calories and protein and recommended increasing ONS to 3 times daily.   I/Os: -932 ml since admit UOP: 575 ml x 24 hrs  Medications reviewed and include: LInzess, Protonix, Miralax, Klor-con, Rocephin  Labs: Na 134 (L), K 3.2 (L), WBC 12.6 (H)   NUTRITION - FOCUSED PHYSICAL EXAM:    Most Recent Value  Orbital Region Severe depletion  Upper Arm Region Severe depletion  Thoracic and Lumbar Region Severe depletion  Buccal Region Severe depletion  Temple Region Moderate depletion  Clavicle Bone Region Severe depletion  Clavicle and Acromion Bone Region Severe depletion  Scapular Bone Region Unable to assess  Dorsal Hand Severe depletion  Patellar Region Severe depletion  Anterior Thigh Region Severe depletion  Posterior Calf Region Severe depletion  Edema (RD Assessment) None  Hair Reviewed  Eyes Reviewed  [pt is visually impaired]  Mouth Reviewed  Skin Reviewed  Nails Reviewed       Diet Order:   Diet Order  Diet - low sodium heart healthy           Diet Heart Room service appropriate? Yes; Fluid consistency: Thin  Diet effective now                 EDUCATION NEEDS:   Education needs have been addressed  Skin:  Skin Assessment: Reviewed RN Assessment  Last BM:  12/7-type 7  Height:   Ht Readings from Last 1 Encounters:  06/05/20 5\' 9"   (1.753 m)    Weight:   Wt Readings from Last 1 Encounters:  06/05/20 56.7 kg    BMI:  Body mass index is 18.46 kg/m.  Estimated Nutritional Needs:   Kcal:  0938-1829  Protein:  90-105  Fluid:  >/= 1.8 L   Lajuan Lines, RD, LDN Clinical Nutrition After Hours/Weekend Pager # in [place]

## 2020-06-17 NOTE — Consult Note (Signed)
I was called by Hospital Medicine to evaluate the patient for right-sided hydronephrosis which was seen on his ultrasound today.  I looked back at his CT scan which was performed upon admission 2 weeks prior which demonstrates hydronephrosis at that, time along with what appears to be several areas of metastatic disease in his right kidney.  Further, he has significant lymphadenopathy in the retroperitoneum which is likely the etiology of the obstruction in his kidney.  The patient has also had some gross hematuria, but this is being managed with a Foley catheter and he is not having any trouble with clot retention or spasm.  The patient has diffuse abdominal pain but no right-sided flank pain.  His renal function is relatively normal.  In conversation with Dr. Manuella Ghazi, I have recommended conservative treatment of right hydronephrosis, especially in light of the lack of symptoms including and normal creatinine and no flank pain.  The patient has extensive metastatic disease, and does not appear to have responded to his treatment.  I would recommend a palliative care consult.  And if at that time after going over things with the patient and if he notes worsening flank pain or worsening renal function at that time we will be glad to try placing a stent.  However, this would require general anesthesia.  Please consult [place] again with any additional questions or concerns.

## 2020-06-18 DIAGNOSIS — Z515 Encounter for palliative care: Secondary | ICD-10-CM

## 2020-06-18 DIAGNOSIS — R109 Unspecified abdominal pain: Secondary | ICD-10-CM

## 2020-06-18 DIAGNOSIS — E43 Unspecified severe protein-calorie malnutrition: Secondary | ICD-10-CM

## 2020-06-18 DIAGNOSIS — Z7189 Other specified counseling: Secondary | ICD-10-CM

## 2020-06-18 LAB — BASIC METABOLIC PANEL
Anion gap: 8 (ref 5–15)
BUN: 17 mg/dL (ref 8–23)
CO2: 25 mmol/L (ref 22–32)
Calcium: 8.8 mg/dL — ABNORMAL LOW (ref 8.9–10.3)
Chloride: 101 mmol/L (ref 98–111)
Creatinine, Ser: 0.83 mg/dL (ref 0.61–1.24)
GFR, Estimated: >60 mL/min (ref >60)
Glucose, Bld: 109 mg/dL — ABNORMAL HIGH (ref 70–99)
Potassium: 4.1 mmol/L (ref 3.5–5.1)
Sodium: 134 mmol/L — ABNORMAL LOW (ref 135–145)

## 2020-06-18 LAB — CBC
HCT: 29.2 % — ABNORMAL LOW (ref 39.0–52.0)
Hemoglobin: 8.8 g/dL — ABNORMAL LOW (ref 13.0–17.0)
MCH: 30 pg (ref 26.0–34.0)
MCHC: 30.1 g/dL (ref 30.0–36.0)
MCV: 99.7 fL (ref 80.0–100.0)
Platelets: 457 10*3/uL — ABNORMAL HIGH (ref 150–400)
RBC: 2.93 MIL/uL — ABNORMAL LOW (ref 4.22–5.81)
RDW: 12.5 % (ref 11.5–15.5)
WBC: 13.5 10*3/uL — ABNORMAL HIGH (ref 4.0–10.5)
nRBC: 0 % (ref 0.0–0.2)

## 2020-06-18 LAB — MAGNESIUM: Magnesium: 1.9 mg/dL (ref 1.7–2.4)

## 2020-06-18 MED ORDER — OXYBUTYNIN CHLORIDE 5 MG PO TABS
2.5000 mg | ORAL_TABLET | Freq: Three times a day (TID) | ORAL | Status: DC
  Administered 2020-06-18 – 2020-06-19 (×4): 2.5 mg via ORAL
  Filled 2020-06-18 (×4): qty 1

## 2020-06-18 MED ORDER — FENTANYL CITRATE (PF) 100 MCG/2ML IJ SOLN
25.0000 ug | Freq: Once | INTRAMUSCULAR | Status: AC
  Administered 2020-06-18: 25 ug via INTRAVENOUS
  Filled 2020-06-18: qty 2

## 2020-06-18 MED ORDER — FENTANYL 12 MCG/HR TD PT72
1.0000 | MEDICATED_PATCH | TRANSDERMAL | Status: DC
  Administered 2020-06-18: 1 via TRANSDERMAL
  Filled 2020-06-18: qty 1

## 2020-06-18 NOTE — Progress Notes (Signed)
Additional face to face encounter-  Notified by TOC SW Shade Flood that hospice RN had met with family and they had expressed, "The RN from Hospice was here and spoke with pt and his brother today and pt said that he is contemplating surgery for the bowel leakage so he isn't quite ready for hospice. "   I visited with patient and called patient's brother [name] to clarify this discussion as it was drastically different from my conversations with them indicating their desire for discharge to Hospice home.  With further discussion it was revealed that [employer]" was asking to help stop his loose stools- he was worried about being embarrassed at the Hospice house- they do want him to go to [place] and they understand what the purpose of it is- I recommended that we could stop the miralax, continue the linzess and see if that helps- told them its a difficult balance with his MS- I also told them that working on his bowels are part of comfort and the Hospice house could continue that work- I encouraged them to consider going ahead and enrolling with Hospice if they knew that going to the Hospice home was their goal, also shared worries with [name] that if we delayed I am concerned he would decline further and miss his window of opportunity for stability to transfer. Both of them were in agreement and [name] said "sign him up" I contacted [facility] and Dr. Wynetta Emery via secure chat to make them aware of the above discussion.   Mariana Kaufman, AGNP-C Palliative Medicine  Total time 43 mins

## 2020-06-18 NOTE — Progress Notes (Signed)
Daily Progress Note   Patient Name: Brandon Castillo       Date: 06/18/2020 DOB: 1950-12-08  Age: 69 y.o. MRN#: [id number] Attending Physician: Brandon Iba, MD Primary Care Physician: Brandon Larsson, NP Admit Date: 06/05/2020  Reason for Consultation/Follow-up: Establishing goals of care  Subjective: Met with patient at bedside. He appeared oriented to his situation.  He expressed how weak he was getting. He is not eating much. He has lost a great deal of weight. He has main all over- significant pain in his bladder and other pain "head to toe". Rates it at a nine. Briefly relieved with morphine IV, however quickly returns before next dose is due. He has ongoing loose stools that are aggravating his perirectal area.  He does not feel that he is strong enough to pursue further chemotherapy. He is ready to face his mortality, calling it his "last great adventure". He would like to spend time at home with his brother, however, he worries about being a burden on them- he doesn't want them to be responsible for bathing him and cleaning his stools. He is bedbound at this point. He is open to going to residential Hospice, he worries about how his brother will feel about this.  I called his brother.  Brandon Castillo expressed great grief at hearing that "Brandon Castillo" is at end-of-life.  Although he does understand that his brother is too weak to pursue aggressive medical measures, and he had already canceled his chemotherapy appointment. I discussed going home with hospice versus residential hospice with Brandon Castillo, he would like time to discuss this with Brandon Castillo and come to decisions together.  Review of Systems  Constitutional: Positive for malaise/fatigue and weight loss.  HENT: Positive for hearing loss.   Eyes:        Blindness    Length of Stay: 13  Current Medications: Scheduled Meds:  . Chlorhexidine Gluconate Cloth  6 each Topical Daily  . feeding supplement  237 mL Oral BID BM  . fentaNYL  1 patch Transdermal Q72H  . linaclotide  72 mcg Oral QAC breakfast  . liver oil-zinc oxide   Topical Daily  .  morphine injection  2 mg Intravenous Once  . oxybutynin  2.5 mg Oral TID  . pantoprazole  40 mg Oral Daily  . polyethylene glycol  17  g Oral Daily    Continuous Infusions: . sodium chloride 1,000 mL (06/06/20 1240)  . sodium chloride 75 mL/hr at 06/16/20 2334  . cefTRIAXone (ROCEPHIN)  IV 1 g (06/17/20 1305)    PRN Meds: sodium chloride, ipratropium-albuterol, morphine injection, ondansetron (ZOFRAN) IV, traZODone  Physical Exam Vitals and nursing note reviewed.  Constitutional:      Appearance: He is ill-appearing.     Comments: cachetic  Pulmonary:     Breath sounds: Wheezing present.  Skin:    General: Skin is warm and dry.     Coloration: Skin is pale.  Neurological:     Mental Status: He is oriented to person, place, and time.  Psychiatric:        Mood and Affect: Mood normal.        Behavior: Behavior normal.        Thought Content: Thought content normal.        Judgment: Judgment normal.             Vital Signs: BP 128/74 (BP Location: Right Arm)   Pulse 89   Temp 98 F (36.7 C) (Oral)   Resp 20   Ht 5' 9"  (1.753 m)   Wt 56.7 kg   SpO2 97%   BMI 18.46 kg/m  SpO2: SpO2: 97 % O2 Device: O2 Device: Room Air O2 Flow Rate:    Intake/output summary:   Intake/Output Summary (Last 24 hours) at 06/18/2020 0955 Last data filed at 06/18/2020 0500 Gross per 24 hour  Intake 50 ml  Output 1800 ml  Net -1750 ml   LBM: Last BM Date: 06/17/20 Baseline Weight: Weight: 56.7 kg Most recent weight: Weight: 56.7 kg       Palliative Assessment/Data: PPS: 20%   Flowsheet Rows     Most Recent Value  Intake Tab  Referral Department Hospitalist  Unit at Time of  Referral Med/Surg Unit  Palliative Care Primary Diagnosis Cancer  Date Notified 06/06/20  Palliative Care Type Return patient Palliative Care  Reason for referral Clarify Goals of Care  Date of Admission 06/05/20  Date first seen by Palliative Care 06/09/20  # of days Palliative referral response time 3 Day(s)  # of days IP prior to Palliative referral 1  Clinical Assessment  Psychosocial & Spiritual Assessment  Palliative Care Outcomes      Patient Active Problem List   Diagnosis Date Noted  . Fecal impaction ([place])   . Constipation 06/05/2020  . Closed left radial fracture 06/05/2020  . Macrocytic anemia 06/05/2020  . Insomnia 06/05/2020  . Lactic acidosis 06/05/2020  . Closed left femoral fracture ([place]) 05/17/2020  . Wrist fracture, closed, left, initial encounter 05/17/2020  . Palliative care encounter   . 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 Palliative care by specialist   . Goals of care, counseling/discussion   . Protein-calorie malnutrition, severe ([place]) 05/12/2020  . Failure to thrive in adult   . Intraabdominal mass   . Generalized abdominal pain 05/11/2020  . Abnormal weight loss 05/11/2020  . Multiple sclerosis ([place])   . 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 Nausea   . Anorexia   . Difficulty urinating   . Leukocytosis   . Sinus tachycardia   . Scrotal sebaceous cyst   . Bilateral renal masses   . Bilateral Adrenal nodules   . Mass of upper lobe of right lung   .  Anxiety, generalized   . Elevated LFTs   . Emphysema lung ([place])   . COPD (chronic obstructive pulmonary disease) ([place])   . Former smoker   . Hyperglycemia     Palliative Care Assessment & Plan   Patient Profile: Brandon Castillo a 69 y.o.malewith multiple medical problems including MS and blindness, who was hospitalized 05/11/2020-05/16/2020 with failure to  thrive and was found to have widely metastatic squamous cell carcinoma of the right lung.CT of the abdomen revealed bilateral renal masses, liver nodule, bilateral adrenal nodules. CT of the chest revealed a right upper lobe mass suspicious for primary lung neoplasm. Patient underwent liver biopsy with pathology revealing squamous cell carcinoma. Patient was discharged home on 11/5 with plan for outpatient oncology follow-up. Unfortunately, after returning home, he fell down several steps and was readmitted to the hospital with fractures to the left hip and left wrist.  Patient readmitted now with abdominal pain and workup revealing gastritis secondary to constipation vs infection. Palliative medicine consulted for goals of care. Patient has been seen by Palliative on 11/4, and 11/6. He was referred for outpatient Palliative followup- but likely has not had time at home to be seen.   Assessment/Recommendations/Plan  Start fentanyl patch 12.21mg for pain- continue IV morphine prn, start liquid morphine prn Patient's brother will contact PMT or primary team regarding residential vs home with hospice Oxybutynin 2.55mTID for bladder spasms  Goals of Care and Additional Recommendations: Limitations on Scope of Treatment: Minimize Medications and No Chemotherapy  Code Status: DNR  Prognosis:  < 4 weeks  Discharge Planning: To Be Determined  Care plan was discussed with patient, patient's brother, and primary team.   Thank you for allowing the Palliative Medicine Team to assist in the care of this patient.   Total time: 63 mins Greater than 50%  of this time was spent counseling and coordinating care related to the above assessment and plan.  KaMariana KaufmanAGNP-C Palliative Medicine   Please contact Palliative Medicine Team phone at [phone] questions and concerns.

## 2020-06-18 NOTE — Progress Notes (Signed)
PROGRESS NOTE    Brandon Castillo  IRS:[id number] DOB: Aug 18, 1950 DOA: 06/05/2020 PCP: [name], NP  Brief Narrative:  As per H&P written by Dr. 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 Patient states that he was sleepwalking last night for the first time in his life and today,his brother was worried that he was confused, he complained of abdominal pain and patient states that he only had a small bowel movement yesterday, and was able to pass gas, but he also complained of abdominal pain. He denies nausea, vomiting, fever, chills.  ED Course: In the emergency department, he was hemodynamically stable. Work-up in the ED showed leukocytosis, thrombocytosis, macrocytic anemia, hyperglycemia, transaminitis, lactic acid 3.1.  CT abdomen and pelvis with contrast showed marked wall thickening throughout the transverse colon suspected to be due to ischemia or tumor infiltration. Large amount of stool in the distal sigmoid colon and rectum compatible with constipation was also noted. Chest x-ray showed no acute cardiopulmonary disease and stable right suprahilar mass Patient was empirically treated with IV cefepimeand vancomycin due to presumed intra-abdominal infection, IV hydration was provided. Hospitalist was asked to admit patient for further evaluation and management.  -Patient was admitted with generalized abdominal pain secondary to concern for sepsis  present on admission with infectious colitis noted on abdominal imaging studies. He was initially started on IV Unasyn and then transition to oral Augmentin. During the course of his admission he was noted to have acute urinary retention and bladder distention for which Foley catheter was placed for decompression and he failed voiding trial on 11/29 and therefore catheter was replaced.  He has now developed hematuria and appears to have UTI for which she has been empirically started on Rocephin with cultures negative.  He is also noted to require some IV fluids.  He is also noted to have some intermittent sleepiness and agitation and is not stable for discharge.  Bedtime Ativan discontinued which appears to be exacerbating symptoms.  Eliquis held for now due to persistent hematuria.  He is noted to have moderate right-sided have hydronephrosis and case was discussed with urology Dr. Louis Meckel who does not recommend any urological procedures given patient condition.  Palliative care consulted and evaluation pending.  Family notified.  Likely will need discharge to home with hospice or hospice facility.  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  Discussed case with Dr. Louis Meckel who does not recommend any surgical procedures.  Palliative care consultation pending.  Likely need for hospice on discharge.  urinary retention/bladder distention signs of UTI and hematuria -Started on Rocephin -Urine cultures obtained with no growth -Foley catheter placed for decompression. 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 -Appears to be stable overall.  Leukocytosis -In the setting of dehydration/colitis/UTI -WBCs has trended down and are essentially within normal limits at this point -Plan to recheck in a.m.  Anxiety, generalized/insomnia -Continue the use of as needed trazodone and hold Ativan due to some altered mentation  closed left radial fracture -Stable and with immobilizer in place -Continue as needed analgesics. -Per records and patient reports,immobilizer cast to come off in about 2 weeks.  stage IV squamous cell carcinoma of right lung with metastases -No need for further oncology follow-up, palliative care consulted  COPD/emphysema -No wheezing or complaining of shortness of breath currently -Continue bronchodilators as needed.  Elevated LFTs -In the setting of most likely lung cancer metastasis to the liver -Continue to follow trend -Continue to maintain adequate hydration orally. -Liver ultrasound to be pursued as an outpatient by the oncology service discretion..  Sepsis present on admission -Patient with positive SIRS criteria on presentation and having source of infection colitis as appreciated on his abdominal imaging studies -Will continue antibiotics,using Rocephin now for  UTI -Continue supportive care and follow resolution of sepsis features -White blood cells has trended down and essentially within normal limits;patient has remained afebrile. -No nausea vomiting reported.  physical deconditioning -Plan for SNF on discharge  DVT prophylaxis: Eliquis discontinued 12/7 Code Status: DNR Family Communication: Discussed with brother on phone 12/7 Disposition Plan:  Status is: Inpatient  Remains inpatient appropriate because:IV treatments appropriate due to intensity of illness or inability to take PO and Inpatient level of care appropriate due to severity of illness   Dispo: The patient is from: SNF  Anticipated d/c is to:  Home hospice or hospice facility  Anticipated d/c date is: 1-2 days  Patient currently is not medically stable to d/c.  He requires ongoing IV fluid and Rocephin with further evaluation of UTI.  Continues to have some AMS and palliative care consultation is pending.  Consultants:   Discussed with urology Dr. Louis Meckel 12/7  Palliative care  Procedures:   See below  Antimicrobials:  Anti-infectives (From admission, onward)   Start     Dose/Rate Route Frequency Ordered Stop   06/16/20 1200  cefTRIAXone (ROCEPHIN) 1 g in sodium chloride 0.9 % 100 mL IVPB        1 g 200 mL/hr over 30 Minutes Intravenous Every 24 hours 06/16/20 1105     06/11/20 0000  amoxicillin-clavulanate (AUGMENTIN) 875-125 MG tablet        1 tablet Oral Every 12 hours 06/11/20 1058 06/14/20 2359   06/08/20 1045  amoxicillin-clavulanate (AUGMENTIN) 875-125 MG per tablet 1 tablet        1 tablet Oral Every 12 hours 06/08/20 0959 06/13/20 0931   06/06/20 0900  Ampicillin-Sulbactam (UNASYN) 3 g in sodium chloride 0.9 % 100 mL IVPB  Status:  Discontinued        3 g 200 mL/hr over 30 Minutes  Intravenous Every 8 hours 06/06/20 0810 06/08/20 0959   06/06/20 0400  vancomycin (VANCOREADY) IVPB 750 mg/150 mL  Status:   Discontinued        750 mg 150 mL/hr over 60 Minutes Intravenous Every 12 hours 06/05/20 1702 06/06/20 0810   06/06/20 0000  ceFEPIme (MAXIPIME) 2 g in sodium chloride 0.9 % 100 mL IVPB  Status:  Discontinued        2 g 200 mL/hr over 30 Minutes Intravenous Every 8 hours 06/05/20 1702 06/06/20 0810   06/05/20 1600  ceFEPIme (MAXIPIME) 2 g in sodium chloride 0.9 % 100 mL IVPB        2 g 200 mL/hr over 30 Minutes Intravenous  Once 06/05/20 1552 06/05/20 1735   06/05/20 1600  vancomycin (VANCOCIN) IVPB 1000 mg/200 mL premix        1,000 mg 200 mL/hr over 60 Minutes Intravenous  Once 06/05/20 1552 06/05/20 2109      Subjective: Patient seen and evaluated today with some somnolence noted this morning.  He was noted to be quite agitated overnight.  He still continues to have some hematuria.  Objective: Vitals:   06/17/20 1303 06/17/20 1645 06/17/20 2102 06/18/20 0427  BP: (!) 148/75 117/69 (!) 160/76 128/74  Pulse: 95 88 98 89  Resp: (!) 21  18 20   Temp: 98 F (36.7 C) 98 F (36.7 C) 97.9 F (36.6 C) 98 F (36.7 C)  TempSrc: Oral Oral Oral Oral  SpO2: 98% 99% 96% 97%  Weight:      Height:        Intake/Output Summary (Last 24 hours) at 06/18/2020 1421 Last data filed at 06/18/2020 0913 Gross per 24 hour  Intake 170 ml  Output 1800 ml  Net -1630 ml   Filed Weights   06/05/20 1442  Weight: 56.7 kg    Examination:  General exam: awake, alert, NAD, cooperative.   Respiratory system: Clear to auscultation. Respiratory effort normal. Cardiovascular system: normal S1 & S2 heard.  Gastrointestinal system: Abdomen is nondistended, soft and diffusely tender greater on right upper  Central nervous system: no focal findings.  Extremities: No c/c/e.   Skin: No rashes, lesions or ulcers Psychiatry: normal affect.  No agitation.  Foley with dark, bloody urine output unchanged  Data Reviewed: I have personally reviewed following labs and imaging studies  CBC: Recent Labs  Lab  06/17/20 1050 06/18/20 0451  WBC 12.6* 13.5*  HGB 8.8* 8.8*  HCT 28.9* 29.2*  MCV 100.3* 99.7  PLT 479* 268*   Basic Metabolic Panel: Recent Labs  Lab 06/15/20 1116 06/17/20 1050 06/18/20 0451  NA 133* 134* 134*  K 4.0 3.2* 4.1  CL 98 102 101  CO2 27 24 25   GLUCOSE 150* 177* 109*  BUN 22 15 17   CREATININE 0.90 0.78 0.83  CALCIUM 9.2 8.7* 8.8*  MG  --   --  1.9   GFR: Estimated Creatinine Clearance: 68.3 mL/min (by C-G formula based on SCr of 0.83 mg/dL). Liver Function Tests: No results for input(s): AST, ALT, ALKPHOS, BILITOT, PROT, ALBUMIN in the last 168 hours. No results for input(s): LIPASE, AMYLASE in the last 168 hours. No results for input(s): AMMONIA in the last 168 hours. Coagulation Profile: No results for input(s): INR, PROTIME in the last 168 hours. Cardiac Enzymes: No results for input(s): CKTOTAL, CKMB, CKMBINDEX, TROPONINI in the last 168 hours. BNP (last 3 results) No results for input(s): PROBNP in the last 8760 hours. HbA1C: No results for input(s):  HGBA1C in the last 72 hours. CBG: No results for input(s): GLUCAP in the last 168 hours. Lipid Profile: No results for input(s): CHOL, HDL, LDLCALC, TRIG, CHOLHDL, LDLDIRECT in the last 72 hours. Thyroid Function Tests: No results for input(s): TSH, T4TOTAL, FREET4, T3FREE, THYROIDAB in the last 72 hours. Anemia Panel: No results for input(s): VITAMINB12, FOLATE, FERRITIN, TIBC, IRON, RETICCTPCT in the last 72 hours. Sepsis Labs: No results for input(s): PROCALCITON, LATICACIDVEN in the last 168 hours.  Recent Results (from the past 240 hour(s))  Culture, Urine     Status: None   Collection Time: 06/16/20  9:14 AM   Specimen: Urine, Clean Catch  Result Value Ref Range Status   Specimen Description   Final    URINE, CLEAN CATCH Performed at [facility], [address] [place], [place] [address]    Special Requests   Final    NONE Performed at [facility], [address] [place], [place]  [address]    Culture   Final    NO GROWTH Performed at [facility], [place] [address] [place],  [address]    Report Status 06/17/2020 FINAL  Final  Resp Panel by RT-PCR (Flu A&B, Covid) Nasopharyngeal Swab     Status: None   Collection Time: 06/16/20 11:05 AM   Specimen: Nasopharyngeal Swab; Nasopharyngeal(NP) swabs in vial transport medium  Result Value Ref Range Status   SARS Coronavirus 2 by RT PCR NEGATIVE NEGATIVE Final    Comment: (NOTE) SARS-CoV-2 target nucleic acids are NOT DETECTED.  The SARS-CoV-2 RNA is generally detectable in upper respiratory specimens during the acute phase of infection. The lowest concentration of SARS-CoV-2 viral copies this assay can detect is 138 copies/mL. A negative result does not preclude SARS-Cov-2 infection and should not be used as the sole basis for treatment or other patient management decisions. A negative result may occur with  improper specimen collection/handling, submission of specimen other than nasopharyngeal swab, presence of viral mutation(s) within the areas targeted by this assay, and inadequate number of viral copies(<138 copies/mL). A negative result must be combined with clinical observations, patient history, and epidemiological information. The expected result is Negative.  Fact Sheet for Patients:  EntrepreneurPulse.com.au  Fact Sheet for Healthcare Providers:  IncredibleEmployment.be  This test is no t yet approved or cleared by the [place] FDA and  has been authorized for detection and/or diagnosis of SARS-CoV-2 by FDA under an Emergency Use Authorization (EUA). This EUA will remain  in effect (meaning this test can be used) for the duration of the COVID-19 declaration under Section 564(b)(1) of the Act, 21 U.S.C.section 360bbb-3(b)(1), unless the authorization is terminated  or revoked sooner.       Influenza A by PCR NEGATIVE NEGATIVE Final   Influenza B by  PCR NEGATIVE NEGATIVE Final    Comment: (NOTE) The Xpert Xpress SARS-CoV-2/FLU/RSV plus assay is intended as an aid in the diagnosis of influenza from Nasopharyngeal swab specimens and should not be used as a sole basis for treatment. Nasal washings and aspirates are unacceptable for Xpert Xpress SARS-CoV-2/FLU/RSV testing.  Fact Sheet for Patients: EntrepreneurPulse.com.au  Fact Sheet for Healthcare Providers: IncredibleEmployment.be  This test is not yet approved or cleared by the [place] FDA and has been authorized for detection and/or diagnosis of SARS-CoV-2 by FDA under an Emergency Use Authorization (EUA). This EUA will remain in effect (meaning this test can be used) for the duration of the COVID-19 declaration under Section 564(b)(1) of the Act, 21  U.S.C. section 360bbb-3(b)(1), unless the authorization is terminated or revoked.  Performed at [facility], [address] [place], [place] [address]     Radiology Studies: No results found.  Scheduled Meds: . Chlorhexidine Gluconate Cloth  6 each Topical Daily  . feeding supplement  237 mL Oral BID BM  . fentaNYL  1 patch Transdermal Q72H  . linaclotide  72 mcg Oral QAC breakfast  . liver oil-zinc oxide   Topical Daily  .  morphine injection  2 mg Intravenous Once  . oxybutynin  2.5 mg Oral TID  . pantoprazole  40 mg Oral Daily  . polyethylene glycol  17 g Oral Daily   Continuous Infusions: . sodium chloride 1,000 mL (06/06/20 1240)  . sodium chloride 75 mL/hr at 06/16/20 2334  . cefTRIAXone (ROCEPHIN)  IV 1 g (06/18/20 1200)    LOS: 13 days   Time spent: 36 minutes  Kasean Denherder Wynetta Emery, MD How to contact the [facility] Attending or Consulting provider [place] or covering provider during after hours [place], for this patient?  1. Check the care team in [facility] and look for a) attending/consulting TR[facility] provider listed and b) the [facility] team listed 2. Log into [email or web address] and use [facility]'s universal password to access. If you do not have the password, please contact the hospital operator. 3. Locate the [facility] provider you are looking for under [facility] and page to a number that you can be directly reached. 4. If you still have difficulty reaching the provider, please page the [facility] (Director on Call) for the Hospitalists listed on amion for assistance.  [facility]  If 7PM-7AM, please contact night-coverage [email or web address] 06/18/2020, 2:21 PM

## 2020-06-18 NOTE — TOC Progression Note (Signed)
Transition of Care (TOC) - Progression Note    Patient Details  Name: Brandon Castillo MRN: [id number] Date of Birth: 02/21/1951  Transition of Care (TOC) CM/SW Contact   , LCSW Phone Number: 06/18/2020, 12:05 PM  Clinical Narrative:     TOC following. Updated by MD that pt now desires hospice care at dc. Palliative APNP, Kasey, has met with pt and his brother and they are still discussion whether they prefer Residential hospice vs hospice at home. Pt and his brother agreeable to referral to [facility] and they will speak with their admissions staff about what the care would look like at each setting to help them decide.  Referred to Cassandra at Hospice. She will update this LCSW once contact has been made and decision for setting of care confirmed.  TOC will follow.  Expected Discharge Plan: Hospice Medical Facility Barriers to Discharge: Hospice Bed not available  Expected Discharge Plan and Services Expected Discharge Plan: Hospice Medical Facility       Living arrangements for the past 2 months: Single Family Home Expected Discharge Date: 06/11/20                           HH [facility]         Social Determinants of Health (SDOH) Interventions    Readmission Risk Interventions Readmission Risk Prevention Plan 06/10/2020  Transportation Screening Complete  HRI or Home Care Consult Complete  Social Work Consult for Recovery Care Planning/Counseling Complete  Palliative Care Screening Complete  Medication Review (RN Care Manager) Complete  Some recent data might be hidden   

## 2020-06-19 ENCOUNTER — Inpatient Hospital Stay (HOSPITAL_COMMUNITY): Payer: Medicare HMO

## 2020-06-19 ENCOUNTER — Inpatient Hospital Stay (HOSPITAL_COMMUNITY)
Admission: EM | Admit: 2020-06-19 | Discharge: 2020-06-20 | DRG: 951 | Disposition: A | Discharge status: Hospice | Attending: Family Medicine | Admitting: Family Medicine

## 2020-06-19 DIAGNOSIS — D539 Nutritional anemia, unspecified: Secondary | ICD-10-CM | POA: Diagnosis present

## 2020-06-19 DIAGNOSIS — F513 Sleepwalking [somnambulism]: Secondary | ICD-10-CM | POA: Diagnosis present

## 2020-06-19 DIAGNOSIS — C7989 Secondary malignant neoplasm of other specified sites: Secondary | ICD-10-CM | POA: Diagnosis present

## 2020-06-19 DIAGNOSIS — D72829 Elevated white blood cell count, unspecified: Secondary | ICD-10-CM | POA: Diagnosis present

## 2020-06-19 DIAGNOSIS — Z66 Do not resuscitate: Secondary | ICD-10-CM | POA: Diagnosis present

## 2020-06-19 DIAGNOSIS — E43 Unspecified severe protein-calorie malnutrition: Secondary | ICD-10-CM | POA: Diagnosis not present

## 2020-06-19 DIAGNOSIS — G35 Multiple sclerosis: Secondary | ICD-10-CM | POA: Diagnosis present

## 2020-06-19 DIAGNOSIS — S52302D Unspecified fracture of shaft of left radius, subsequent encounter for closed fracture with routine healing: Secondary | ICD-10-CM | POA: Diagnosis not present

## 2020-06-19 DIAGNOSIS — K5900 Constipation, unspecified: Secondary | ICD-10-CM | POA: Diagnosis present

## 2020-06-19 DIAGNOSIS — H547 Unspecified visual loss: Secondary | ICD-10-CM | POA: Diagnosis present

## 2020-06-19 DIAGNOSIS — Z885 Allergy status to narcotic agent status: Secondary | ICD-10-CM | POA: Diagnosis not present

## 2020-06-19 DIAGNOSIS — R1084 Generalized abdominal pain: Secondary | ICD-10-CM | POA: Diagnosis not present

## 2020-06-19 DIAGNOSIS — H543 Unqualified visual loss, both eyes: Secondary | ICD-10-CM | POA: Diagnosis not present

## 2020-06-19 DIAGNOSIS — E872 Acidosis: Secondary | ICD-10-CM | POA: Diagnosis not present

## 2020-06-19 DIAGNOSIS — N029 Recurrent and persistent hematuria with unspecified morphologic changes: Secondary | ICD-10-CM | POA: Diagnosis present

## 2020-06-19 DIAGNOSIS — D75839 Thrombocytosis, unspecified: Secondary | ICD-10-CM | POA: Diagnosis present

## 2020-06-19 DIAGNOSIS — F419 Anxiety disorder, unspecified: Secondary | ICD-10-CM | POA: Diagnosis present

## 2020-06-19 DIAGNOSIS — S52572A Other intraarticular fracture of lower end of left radius, initial encounter for closed fracture: Secondary | ICD-10-CM | POA: Diagnosis present

## 2020-06-19 DIAGNOSIS — Z515 Encounter for palliative care: Secondary | ICD-10-CM | POA: Diagnosis present

## 2020-06-19 DIAGNOSIS — J439 Emphysema, unspecified: Secondary | ICD-10-CM | POA: Diagnosis present

## 2020-06-19 DIAGNOSIS — Z20822 Contact with and (suspected) exposure to covid-19: Secondary | ICD-10-CM | POA: Diagnosis present

## 2020-06-19 DIAGNOSIS — Z87891 Personal history of nicotine dependence: Secondary | ICD-10-CM

## 2020-06-19 DIAGNOSIS — R739 Hyperglycemia, unspecified: Secondary | ICD-10-CM | POA: Diagnosis present

## 2020-06-19 DIAGNOSIS — Z96642 Presence of left artificial hip joint: Secondary | ICD-10-CM | POA: Diagnosis present

## 2020-06-19 DIAGNOSIS — N1330 Unspecified hydronephrosis: Secondary | ICD-10-CM | POA: Diagnosis present

## 2020-06-19 DIAGNOSIS — Z79899 Other long term (current) drug therapy: Secondary | ICD-10-CM

## 2020-06-19 DIAGNOSIS — N3289 Other specified disorders of bladder: Secondary | ICD-10-CM | POA: Diagnosis present

## 2020-06-19 DIAGNOSIS — C3491 Malignant neoplasm of unspecified part of right bronchus or lung: Secondary | ICD-10-CM | POA: Diagnosis present

## 2020-06-19 DIAGNOSIS — C787 Secondary malignant neoplasm of liver and intrahepatic bile duct: Secondary | ICD-10-CM | POA: Diagnosis present

## 2020-06-19 DIAGNOSIS — Z85118 Personal history of other malignant neoplasm of bronchus and lung: Secondary | ICD-10-CM | POA: Diagnosis not present

## 2020-06-19 DIAGNOSIS — Z7901 Long term (current) use of anticoagulants: Secondary | ICD-10-CM

## 2020-06-19 DIAGNOSIS — J449 Chronic obstructive pulmonary disease, unspecified: Secondary | ICD-10-CM | POA: Diagnosis not present

## 2020-06-19 DIAGNOSIS — R110 Nausea: Secondary | ICD-10-CM | POA: Diagnosis not present

## 2020-06-19 DIAGNOSIS — G4700 Insomnia, unspecified: Secondary | ICD-10-CM | POA: Diagnosis not present

## 2020-06-19 MED ORDER — ACETAMINOPHEN 650 MG RE SUPP: 650.0000 mg | Freq: Four times a day (QID) | RECTAL | Status: DC | PRN

## 2020-06-19 MED ORDER — HALOPERIDOL 0.5 MG PO TABS: 0.5000 mg | ORAL_TABLET | ORAL | Status: DC | PRN

## 2020-06-19 MED ORDER — GLYCOPYRROLATE 1 MG PO TABS: 1.0000 mg | ORAL_TABLET | ORAL | Status: DC | PRN

## 2020-06-19 MED ORDER — FENTANYL 12 MCG/HR TD PT72: 1.0000 | MEDICATED_PATCH | TRANSDERMAL | Status: DC

## 2020-06-19 MED ORDER — IPRATROPIUM-ALBUTEROL 0.5-2.5 (3) MG/3ML IN SOLN: 3.0000 mL | RESPIRATORY_TRACT | Status: DC | PRN

## 2020-06-19 MED ORDER — ACETAMINOPHEN 325 MG PO TABS: 650.0000 mg | ORAL_TABLET | Freq: Four times a day (QID) | ORAL | Status: DC | PRN

## 2020-06-19 MED ORDER — GLYCOPYRROLATE 0.2 MG/ML IJ SOLN
0.2000 mg | INTRAMUSCULAR | Status: DC | PRN
  Administered 2020-06-20: 0.2 mg via INTRAVENOUS
  Filled 2020-06-19: qty 1

## 2020-06-19 MED ORDER — ONDANSETRON HCL 4 MG/2ML IJ SOLN: 4.0000 mg | Freq: Four times a day (QID) | INTRAMUSCULAR | Status: DC | PRN

## 2020-06-19 MED ORDER — HALOPERIDOL LACTATE 5 MG/ML IJ SOLN
0.5000 mg | INTRAMUSCULAR | Status: DC | PRN
  Administered 2020-06-20: 0.5 mg via INTRAVENOUS
  Filled 2020-06-19: qty 1

## 2020-06-19 MED ORDER — BIOTENE DRY MOUTH MT LIQD: 15.0000 mL | OROMUCOSAL | Status: DC | PRN

## 2020-06-19 MED ORDER — ONDANSETRON 4 MG PO TBDP: 4.0000 mg | ORAL_TABLET | Freq: Four times a day (QID) | ORAL | Status: DC | PRN

## 2020-06-19 MED ORDER — MORPHINE SULFATE (PF) 2 MG/ML IV SOLN
1.0000 mg | INTRAVENOUS | Status: DC | PRN
  Administered 2020-06-19 – 2020-06-20 (×3): 1 mg via INTRAVENOUS
  Filled 2020-06-19 (×3): qty 1

## 2020-06-19 MED ORDER — OXYBUTYNIN CHLORIDE 5 MG PO TABS
2.5000 mg | ORAL_TABLET | Freq: Three times a day (TID) | ORAL | Status: DC
  Administered 2020-06-19 – 2020-06-20 (×4): 2.5 mg via ORAL
  Filled 2020-06-19 (×4): qty 1

## 2020-06-19 MED ORDER — ENSURE ENLIVE PO LIQD: 237.0000 mL | Freq: Two times a day (BID) | ORAL | Status: DC

## 2020-06-19 MED ORDER — GLYCOPYRROLATE 0.2 MG/ML IJ SOLN: 0.2000 mg | INTRAMUSCULAR | Status: DC | PRN

## 2020-06-19 MED ORDER — SODIUM CHLORIDE 0.9 % IV SOLN: INTRAVENOUS | Status: DC | PRN

## 2020-06-19 MED ORDER — HALOPERIDOL LACTATE 2 MG/ML PO CONC
0.5000 mg | ORAL | Status: DC | PRN
  Filled 2020-06-19: qty 0.3

## 2020-06-19 MED ORDER — POLYVINYL ALCOHOL 1.4 % OP SOLN: 1.0000 [drp] | Freq: Four times a day (QID) | OPHTHALMIC | Status: DC | PRN

## 2020-06-19 MED ORDER — ZINC OXIDE 40 % EX OINT
TOPICAL_OINTMENT | Freq: Every day | CUTANEOUS | Status: DC
  Filled 2020-06-19: qty 57

## 2020-06-19 NOTE — Plan of Care (Signed)
  Problem: Education: Goal: Knowledge of General Education information will improve Description: Including pain rating scale, medication(s)/side effects and non-pharmacologic comfort measures Outcome: Not Progressing   Problem: Health Behavior/Discharge Planning: Goal: Ability to manage health-related needs will improve Outcome: Not Progressing   
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
## 2020-06-19 NOTE — Discharge Summary (Signed)
Physician Discharge Summary  Patient ID: Brandon Castillo MRN: [id number] DOB/AGE: Jun 16, 1951 69 y.o.  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    Consults: palliative medicine  Discharge Exam: Blood pressure (!) 157/80, pulse 91, temperature 97.7 F (36.5 C), resp. rate 17, height 5\' 9"  (1.753 m), weight 56.7 kg, SpO2 96 %.  General exam: awake, somnolent, NAD, cooperative.   Respiratory system: coarse anterior breath sounds. Cardiovascular system: normal S1 & S2 heard.  Gastrointestinal system: Abdomen is nondistended, soft and diffusely tender greater on right upper  Central nervous system: no focal findings.  Extremities: No c/c/e.   Skin: No rashes, lesions or ulcers Psychiatry: normal affect.  No agitation.  Foley with dark, bloody urine output unchanged  Disposition: Discharge disposition: 51-Hospice/Medical Facility      Discharge Instructions    Ambulatory referral to Urology   Complete by: As directed    Neurogenic bladder with foley catheter follow up.   Diet - low sodium heart healthy   Complete by: As directed    Discharge wound care:   Complete by: As directed    Daily wound dressing changes.   Increase activity slowly   Complete by: As directed        Contact information for follow-up providers    [facility] Follow [facility].   Specialty: [place] Why: PT/ RN/OT       [place] Follow up.   Specialty: Hospice Why:  Palliative services Contact information: [address] [place] [phone]       [name], NP Follow up in 1 week(s).   Specialty: Nurse Practitioner Contact information: [address]  [place] [address] [place] [address] [phone]            Contact information for after-discharge care    [facility] .   Service: Skilled Nursing Contact information: [address] [place] [phone]                 Time spent 35 mins  Signed: Madalina Rosman Wynetta Emery 06/19/2020, 1:33 PM How to contact the [facility] Attending or Consulting provider [place] or covering provider during after hours [place], for this patient?  1. Check the care team in [place] [facility] and look for a) attending/consulting TRH provider listed and b) the [place] [facility] team listed 2. Log into [email or web address] and use [place] universal password to access. If you do not have the password, please contact the Castillo operator. 3. Locate the [place] [facility] provider you are looking for under [facility] and page to a number that you can be directly reached. 4. If you still have difficulty reaching the provider, please page the [facility] (Director  on Call) for the Hospitalists listed on amion for assistance.

## 2020-06-19 NOTE — Progress Notes (Signed)
Walked into patient's room and he had pulled his catheter out. Notified Dr. Wynetta Emery and will reinsert another one.
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
## 2020-06-19 NOTE — TOC Progression Note (Signed)
Transition of Care [facility]) - Progression Note    Patient Details  Name: ALIX STOWERS MRN: [id number] Date of Birth: 10/03/1950  Transition [facility]) CM/SW Contact  Shade Flood, LCSW Phone Number: 06/19/2020, 1:42 PM  Clinical Narrative:     TOC following. [facility] RN met with pt again today to discuss admission. Pt is agreeable and has been admitted for GIP bed. Updated MD for dc to readmit to hospice orders and chart flip process.   TOC will follow and assist as needed.  Expected Discharge Plan: [place] Barriers to Discharge: Hospice Bed not available  Expected Discharge Plan and Services Expected Discharge Plan: [place]       Living arrangements for the past 2 months: Single Family Home Expected Discharge Date: 06/19/20                           [facility] Agency: [place] ([facility])         Social Determinants of Health (SDOH) Interventions    Readmission Risk Interventions Readmission Risk Prevention Plan 06/10/2020  Transportation Screening Complete  HRI or [place] Complete  Social Work Consult for [place] Planning/Counseling Complete  Palliative Care Screening Complete  Medication Review Press photographer) Complete  Some recent data might be hidden

## 2020-06-20 LAB — SARS CORONAVIRUS 2 BY RT PCR (HOSPITAL ORDER, PERFORMED IN ~~LOC~~ HOSPITAL LAB): SARS Coronavirus 2: NEGATIVE

## 2020-06-20 MED ORDER — MORPHINE SULFATE (PF) 2 MG/ML IV SOLN
2.0000 mg | INTRAVENOUS | Status: DC | PRN
  Administered 2020-06-20 (×3): 2 mg via INTRAVENOUS
  Filled 2020-06-20 (×3): qty 1

## 2020-06-20 NOTE — Plan of Care (Signed)
  Problem: Education: Goal: Knowledge of General Education information will improve Description: Including pain rating scale, medication(s)/side effects and non-pharmacologic comfort measures 06/20/2020 1247 by Santa Lighter, RN Outcome: Adequate for Discharge 06/20/2020 1246 by Santa Lighter, RN Outcome: Not Progressing   Problem: Clinical Measurements: Goal: Ability to maintain clinical measurements within normal limits will improve Outcome: Adequate for Discharge Goal: Will remain free from infection Outcome: Adequate for Discharge Goal: Diagnostic test results will improve Outcome: Adequate for Discharge Goal: Respiratory complications will improve Outcome: Adequate for Discharge Goal: Cardiovascular complication will be avoided Outcome: Adequate for Discharge   Problem: Activity: Goal: Risk for activity intolerance will decrease Outcome: Adequate for Discharge   Problem: Nutrition: Goal: Adequate nutrition will be maintained Outcome: Adequate for Discharge   Problem: Coping: Goal: Level of anxiety will decrease Outcome: Adequate for Discharge   Problem: Elimination: Goal: Will not experience complications related to bowel motility Outcome: Adequate for Discharge Goal: Will not experience complications related to urinary retention Outcome: Adequate for Discharge   Problem: Pain Managment: Goal: General experience of comfort will improve Outcome: Adequate for Discharge   Problem: Safety: Goal: Ability to remain free from injury will improve Outcome: Adequate for Discharge   Problem: Skin Integrity: Goal: Risk for impaired skin integrity will decrease Outcome: Adequate for Discharge

## 2020-06-20 NOTE — Discharge Summary (Signed)
Physician Discharge Summary  TREYON Castillo NWG:[id number] DOB: 30-Dec-1950 DOA: 06/19/2020  PCP: [name], NP  Admit date: 06/19/2020 Discharge date: 06/20/2020  Disposition:  Residential Hospice   Recommendations for Outpatient Follow-up:  SYMPTOM MANAGEMENT PER HOSPICE PROTOCOL  Discharge Condition: HOSPICE   CODE STATUS: DNR    Brief Hospitalization Summary: Please see all hospital notes, images, labs for full details of the hospitalization. 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  Patient stated that he was sleepwalking last night for the first time in his life and today,his brother was worried that he was confused, he complained of abdominal pain and patient states that he only had a small bowel movement yesterday, and was able to pass gas, but he also complained of abdominal pain. He denies nausea, vomiting, fever, chills.  In the emergency department, he was hemodynamically stable. Work-up in the ED showed leukocytosis, thrombocytosis, macrocytic anemia, hyperglycemia, transaminitis, lactic acid 3.1. CT abdomen and pelvis with contrast showed marked wall thickening throughout the transverse colon suspected to be due to ischemia or tumor infiltration. Large amount of stool in the distal sigmoid colon and rectum compatible with constipation was also noted.  Chest x-ray showed no acute cardiopulmonary disease and stable right suprahilar mass Patient was empirically  treated with IV cefepimeand vancomycin due to presumed intra-abdominal infection, IV hydration was provided. Hospitalist was asked to admit patient for further evaluation and management.  Patient was admitted with generalized abdominal pain secondary to concern for sepsis present on admission with infectious colitis noted on abdominal imaging studies. He was initially started on IV Unasyn and then transition to oral Augmentin. During the course of his admission he was noted to have acute urinary retention and bladder distention for which Foley catheter was placed for decompression and he failed voiding trial on 11/29 and therefore catheter was replaced.He has now developed hematuria and appears to have UTI for which she has been empirically started on Rocephin with cultures negative. He is also noted to require some IV fluids. He is also noted to have some intermittent sleepiness and agitation and is not stable for discharge. Bedtime Ativan discontinued which appears to be exacerbating symptoms. Eliquis held for now due to persistent hematuria. He is noted to have moderate right-sided have hydronephrosis and case was discussed with urology Dr. Louis Meckel who does not recommend any urological procedures given patient condition. Palliative care consulted and evaluation pending. Family notified.   After multiple palliative discussions, patient and family decided on residential hospice and has been admitted under GIP status as hospice facility does not have a bed.    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 (Pleural/Chest/Abdomen/Liver)/Non-Small Cell Carcinoma)    Discharge Instructions:  Allergies as of 06/20/2020      Reactions   Demerol [meperidine Hcl] Other (See Comments)   Had trouble  waking up      Medication List    STOP taking these medications  apixaban 2.5 MG Tabs tablet Commonly known as: ELIQUIS   feeding supplement Liqd   ferrous sulfate 325 (65 FE) MG tablet   HYDROcodone-acetaminophen 10-325 MG tablet Commonly known as: NORCO   linaclotide 72 MCG capsule Commonly known as: LINZESS   LORazepam 1 MG tablet Commonly known as: ATIVAN   multivitamin with minerals tablet   multivitamin-lutein Caps capsule   ondansetron 4 MG tablet Commonly known as: ZOFRAN   pantoprazole 40 MG tablet Commonly known as: PROTONIX   polyethylene glycol 17 g packet Commonly known as: MIRALAX / GLYCOLAX   senna-docusate 8.6-50 MG tablet Commonly known as: Senokot-S   traZODone 100 MG tablet Commonly known as: DESYREL       Allergies  Allergen Reactions  . Demerol [Meperidine Hcl] Other (See Comments)    Had trouble waking up   Allergies as of 06/20/2020      Reactions   Demerol [meperidine Hcl] Other (See Comments)   Had trouble waking up      Medication List    STOP taking these medications   apixaban 2.5 MG Tabs tablet Commonly known as: ELIQUIS   feeding supplement Liqd   ferrous sulfate 325 (65 FE) MG tablet   HYDROcodone-acetaminophen 10-325 MG tablet Commonly known as: NORCO   linaclotide 72 MCG capsule Commonly known as: LINZESS   LORazepam 1 MG tablet Commonly known as: ATIVAN   multivitamin with minerals tablet   multivitamin-lutein Caps capsule   ondansetron 4 MG tablet Commonly known as: ZOFRAN   pantoprazole 40 MG tablet Commonly known as: PROTONIX   polyethylene glycol 17 g packet Commonly known as: MIRALAX / GLYCOLAX   senna-docusate 8.6-50 MG tablet Commonly known as: Senokot-S   traZODone 100 MG tablet Commonly known as: DESYREL       Procedures/Studies: US Abdomen Complete  Result Date: 06/16/2020 CLINICAL DATA:  Abdominal pain, new diagnosis of stage IV lung cancer, abnormal LFTs EXAM: ABDOMEN  ULTRASOUND COMPLETE COMPARISON:  CT abdomen and pelvis 06/05/2020 FINDINGS: Gallbladder: Normally distended without stones or wall thickening. No pericholecystic fluid or sonographic Murphy sign. Common bile duct: Diameter: 4 mm, normal Liver: Mildly heterogeneous echogenicity. Mass identified within RIGHT lobe liver 2.4 x 2.0 x 2.9 cm. No additional hepatic masses. Portal vein is patent on color Doppler imaging with normal direction of blood flow towards the liver. IVC: Normal appearance Pancreas: Obscured by bowel gas Spleen: Normal appearance, 4.5 cm length Right Kidney: Length: 12.8 cm. Normal cortical thickness. Increased cortical echogenicity. Moderate RIGHT hydronephrosis. Large heterogeneous solid mass at inferior pole 7.0 x 6.2 x 6.1 cm corresponding to tumor on CT. Additional smaller mass at upper pole less well demonstrated due to shadowing, approximately 3.1 x 3.7 cm on sagittal image. Left Kidney: Length: 10.8 cm. Cortical thickening mid kidney. Area of somewhat masslike prominence at mid LEFT kidney 5.1 cm greatest diameter but may be an artifact, without definite mass at this position seen on prior CT. Small nodules at upper pole of LEFT kidney on prior CT exam is not delineated due to shadowing by bowel gas. Abdominal aorta: Normal caliber proximal to mid, obscured distally by bowel gas Other findings: No free fluid IMPRESSION: Moderate RIGHT hydronephrosis. Two RIGHT renal masses consistent with tumor, larger 7.0 cm diameter at inferior pole. Mass in RIGHT lobe liver 2.9 cm greatest size, corresponding to tumor seen on CT. Masslike area within the LEFT kidney does not demonstrate a corresponding lesion on CT, suspect represents artifact rather than tumor though could be characterized by  MR if clinically indicated. Pancreas obscured by bowel gas. Electronically Signed   By: Lavonia Dana M.D.   On: 06/16/2020 12:16   CT ABDOMEN PELVIS W CONTRAST  Result Date: 06/05/2020 CLINICAL DATA:  The  abdominal distension. Stage IV lung cancer. Known liver metastases. EXAM: CT ABDOMEN AND PELVIS WITH CONTRAST TECHNIQUE: Multidetector CT imaging of the abdomen and pelvis was performed using the standard protocol following bolus administration of intravenous contrast. CONTRAST:  93mL OMNIPAQUE IOHEXOL 300 MG/ML  SOLN COMPARISON:  CT of the abdomen and pelvis with contrast 05/11/2020. FINDINGS: Lower chest: Scarring at the right lung base is stable. Heart size is normal. No significant pleural or pericardial effusion is present. Hepatobiliary: Focal hepatic lesion near the gallbladder fossa has increased in size, now measuring 2 4 cm. No new hepatic lesions are present. The common bile duct and gallbladder. Pancreas: Unremarkable. No pancreatic ductal dilatation or surrounding inflammatory changes. Spleen: Normal in size without focal abnormality. Adrenals/Urinary Tract: Adrenal glands are within normal limits bilaterally. Multiple hypodense renal lesions have increased in size. The largest is at the lower pole of the right kidney, now measuring 6.0 x 6.5 x 6.0 cm. This displaces the collecting system upwards with mild caliectasis. Additional lesions near the upper pole of the right kidney have a similar density. The largest scratched at the second largest lesion measures up to 4.1 cm. Two similar lesions are present the upper pole of the left kidney both increased in size. The larger measures 1.9 cm on the coronal images. Ureters are within normal limits. The urinary bladder is distended present. Poor collaterals obscured artifact placement Stomach/Bowel: Acute. Is. Soft tissue density near the tip of the cecum may represent peritoneal implant measuring 2.4 x 1.6 x 1.5 cm. Proximal ascending colon is within normal limits. There is some wall thickening in the distal ascending colon marked wall thickening throughout the ears: The wall thickening is predominantly on the mesenteric surface. Anti mesenteric surface the  colon is unremarkable. The more distal descending colon is normal. A large amount of stool is present in the distal sigmoid colon and rectum a transverse diameter of the rectum up to 9.2 cm. Vascular/Lymphatic: Aortocaval node at the level of the kidneys is stable 6 mm in short axis on image 29. No significant retroperitoneal adenopathy is present. Atherosclerotic calcifications are present aorta and branch vessels without aneurysm. Celiac and SMA origins are within limits. Reproductive: Prostate is enlarged, stable. Other: No free air is present. No significant ventral hernia is present. Musculoskeletal: Interval superior endplate fractures present at L1 25% loss of height is noted anteriorly without retropulsed bone. Chronic endplate changes are present left at L4-5. No focal lytic or blastic lesions are present. Bony pelvis is intact. Left total hip arthroplasty noted. The right hip is normal. IMPRESSION: 1. Marked wall thickening throughout the transverse colon. This is an atypical appearance of edema. While this could represent ischemia increased lactic acid level, tumor infiltration is also considered. 2. Large amount of stool in the distal sigmoid colon and rectum compatible with constipation. The transverse diameter of the rectum up to 9.2 cm. 3. Enlarging hypodense lesions in the kidneys bilaterally consistent with progressive metastatic disease. 4. Enlarging hypodense lesion near the gallbladder fossa is also consistent with progressive metastasis. 5. Soft tissue density near the tip of the cecum may represent peritoneal implant measuring 2.4 x 1.6 x 1.5 cm. No other definite peritoneal implants are evident. 6. Marked distention of the urinary bladder. This accounts for much  of the lower abdominal distension. 7. Interval superior endplate compression fracture at L1 with 25% loss of height anteriorly without retropulsed bone. 8. Aortic Atherosclerosis (ICD10-I70.0). Electronically Signed   By: San Morelle M.D.   On: 06/05/2020 18:17   DG Chest Port 1 View  Result Date: 06/05/2020 CLINICAL DATA:  Stage IV lung cancer. Abdominal pain. Possible sepsis. EXAM: PORTABLE CHEST 1 VIEW COMPARISON:  One-view chest x-ray 05/17/2020. CT of the chest 05/11/2020 FINDINGS: Heart size normal. Right IJ Port-A-Cath is stable. Atherosclerotic changes are noted at the arch. Right suprahilar mass lesion again noted. The lungs are otherwise clear. No focal airspace disease present. No edema or effusion is present. Changes of COPD are noted. IMPRESSION: 1. Stable right suprahilar mass. 2. No acute cardiopulmonary disease. 3. Atherosclerosis. Electronically Signed   By: San Morelle M.D.   On: 06/05/2020 16:17   DG MINI C-ARM IMAGE ONLY  Result Date: 06/02/2020 There is no interpretation for this exam.  This order is for images obtained during a surgical procedure.  Please See "Surgeries" Tab for more information regarding the procedure.     Discharge Exam: Vitals:   Vitals:   General exam:frail, blind male with dark glasses, awake,NAD, cooperative. Respiratory system:coarse anterior breath sounds. Cardiovascular system:normalS1 &S2 heard.  Gastrointestinal system:Abdomen is nondistended, soft and diffusely tender greater on right upper Central nervous system:no focal findings. Extremities: Noc/c/e. Skin: No rashes, lesions or ulcers Psychiatry:normal affect. No agitation.  Foley with dark, bloody urine outputunchanged   The results of significant diagnostics from this hospitalization (including imaging, microbiology, ancillary and laboratory) are listed below for reference.     Microbiology: Recent Results (from the past 240 hour(s))  Culture, Urine     Status: None   Collection Time: 06/16/20  9:14 AM   Specimen: Urine, Clean Catch  Result Value Ref Range Status   Specimen Description   Final    URINE, CLEAN CATCH Performed at [facility], [address],  [place], [place] [address]    Special Requests   Final    NONE Performed at [facility], [address], [place], [place] [address]    Culture   Final    NO GROWTH Performed at [facility], [place] [address], [place], [place] [address]    Report Status 06/17/2020 FINAL  Final  Resp Panel by RT-PCR (Flu A&B, Covid) Nasopharyngeal Swab     Status: None   Collection Time: 06/16/20 11:05 AM   Specimen: Nasopharyngeal Swab; Nasopharyngeal(NP) swabs in vial transport medium  Result Value Ref Range Status   SARS Coronavirus 2 by RT PCR NEGATIVE NEGATIVE Final    Comment: (NOTE) SARS-CoV-2 target nucleic acids are NOT DETECTED.  The SARS-CoV-2 RNA is generally detectable in upper respiratory specimens during the acute phase of infection. The lowest concentration of SARS-CoV-2 viral copies this assay can detect is 138 copies/mL. A negative result does not preclude SARS-Cov-2 infection and should not be used as the sole basis for treatment or other patient management decisions. A negative result may occur with  improper specimen collection/handling, submission of specimen other than nasopharyngeal swab, presence of viral mutation(s) within the areas targeted by this assay, and inadequate number of viral copies(<138 copies/mL). A negative result must be combined with clinical observations, patient history, and epidemiological information. The expected result is Negative.  Fact Sheet for Patients:  EntrepreneurPulse.com.au  Fact Sheet for Healthcare Providers:  IncredibleEmployment.be  This test is no t yet approved or cleared by the [place]  States FDA and  has been authorized for detection and/or diagnosis of SARS-CoV-2 by FDA under an Emergency Use Authorization (EUA). This EUA will remain  in effect (meaning this test can be used) for the duration of the COVID-19 declaration under Section 564(b)(1) of the Act, 21 U.S.C.section 360bbb-3(b)(1), unless  the authorization is terminated  or revoked sooner.       Influenza A by PCR NEGATIVE NEGATIVE Final   Influenza B by PCR NEGATIVE NEGATIVE Final    Comment: (NOTE) The Xpert Xpress SARS-CoV-2/FLU/RSV plus assay is intended as an aid in the diagnosis of influenza from Nasopharyngeal swab specimens and should not be used as a sole basis for treatment. Nasal washings and aspirates are unacceptable for Xpert Xpress SARS-CoV-2/FLU/RSV testing.  Fact Sheet for Patients: EntrepreneurPulse.com.au  Fact Sheet for Healthcare Providers: IncredibleEmployment.be  This test is not yet approved or cleared by the [place] FDA and has been authorized for detection and/or diagnosis of SARS-CoV-2 by FDA under an Emergency Use Authorization (EUA). This EUA will remain in effect (meaning this test can be used) for the duration of the COVID-19 declaration under Section 564(b)(1) of the Act, 21 U.S.C. section 360bbb-3(b)(1), unless the authorization is terminated or revoked.  Performed at [facility], [address] [place], [place] [address]      Labs: BNP (last 3 results) No results for input(s): BNP in the last 8760 hours. Basic Metabolic Panel: Recent Labs  Lab 06/15/20 1116 06/17/20 1050 06/18/20 0451  NA 133* 134* 134*  K 4.0 3.2* 4.1  CL 98 102 101  CO2 27 24 25   GLUCOSE 150* 177* 109*  BUN 22 15 17   CREATININE 0.90 0.78 0.83  CALCIUM 9.2 8.7* 8.8*  MG  --   --  1.9   Liver Function Tests: No results for input(s): AST, ALT, ALKPHOS, BILITOT, PROT, ALBUMIN in the last 168 hours. No results for input(s): LIPASE, AMYLASE in the last 168 hours. No results for input(s): AMMONIA in the last 168 hours. CBC: Recent Labs  Lab 06/17/20 1050 06/18/20 0451  WBC 12.6* 13.5*  HGB 8.8* 8.8*  HCT 28.9* 29.2*  MCV 100.3* 99.7  PLT 479* 457*   Cardiac Enzymes: No results for input(s): CKTOTAL, CKMB, CKMBINDEX, TROPONINI in the last 168  hours. BNP: Invalid input(s): POCBNP CBG: No results for input(s): GLUCAP in the last 168 hours. D-Dimer No results for input(s): DDIMER in the last 72 hours. Hgb A1c No results for input(s): HGBA1C in the last 72 hours. Lipid Profile No results for input(s): CHOL, HDL, LDLCALC, TRIG, CHOLHDL, LDLDIRECT in the last 72 hours. Thyroid function studies No results for input(s): TSH, T4TOTAL, T3FREE, THYROIDAB in the last 72 hours.  Invalid input(s): FREET3 Anemia work up No results for input(s): VITAMINB12, FOLATE, FERRITIN, TIBC, IRON, RETICCTPCT in the last 72 hours. Urinalysis  Sepsis Labs Invalid input(s): PROCALCITONIN,  WBC,  LACTICIDVEN Microbiology Recent Results (from the past 240 hour(s))  Culture, Urine     Status: None   Collection Time: 06/16/20  9:14 AM   Specimen: Urine, Clean Catch  Result Value Ref Range Status   Specimen Description   Final    URINE, CLEAN CATCH Performed at [facility], [address] [place], [place] [address]    Special Requests   Final    NONE Performed at [facility], [address], [place], [place] [address]    Culture   Final    NO GROWTH Performed at [facility], [place]  [address] [place], [place] [address]    Report Status 06/17/2020 FINAL  Final  Resp Panel by RT-PCR (Flu A&B, Covid) Nasopharyngeal Swab     Status: None   Collection Time: 06/16/20 11:05 AM   Specimen: Nasopharyngeal Swab; Nasopharyngeal(NP) swabs in vial transport medium  Result Value Ref Range Status   SARS Coronavirus 2 by RT PCR NEGATIVE NEGATIVE Final    Comment: (NOTE) SARS-CoV-2 target nucleic acids are NOT DETECTED.  The SARS-CoV-2 RNA is generally detectable in upper respiratory specimens during the acute phase of infection. The lowest concentration of SARS-CoV-2 viral copies this assay can detect is 138 copies/mL. A negative result does not preclude SARS-Cov-2 infection and should not be used as the sole basis for treatment or other patient  management decisions. A negative result may occur with  improper specimen collection/handling, submission of specimen other than nasopharyngeal swab, presence of viral mutation(s) within the areas targeted by this assay, and inadequate number of viral copies(<138 copies/mL). A negative result must be combined with clinical observations, patient history, and epidemiological information. The expected result is Negative.  Fact Sheet for Patients:  EntrepreneurPulse.com.au  Fact Sheet for Healthcare Providers:  IncredibleEmployment.be  This test is no t yet approved or cleared by the [place] FDA and  has been authorized for detection and/or diagnosis of SARS-CoV-2 by FDA under an Emergency Use Authorization (EUA). This EUA will remain  in effect (meaning this test can be used) for the duration of the COVID-19 declaration under Section 564(b)(1) of the Act, 21 U.S.C.section 360bbb-3(b)(1), unless the authorization is terminated  or revoked sooner.       Influenza A by PCR NEGATIVE NEGATIVE Final   Influenza B by PCR NEGATIVE NEGATIVE Final    Comment: (NOTE) The Xpert Xpress SARS-CoV-2/FLU/RSV plus assay is intended as an aid in the diagnosis of influenza from Nasopharyngeal swab specimens and should not be used as a sole basis for treatment. Nasal washings and aspirates are unacceptable for Xpert Xpress SARS-CoV-2/FLU/RSV testing.  Fact Sheet for Patients: EntrepreneurPulse.com.au  Fact Sheet for Healthcare Providers: IncredibleEmployment.be  This test is not yet approved or cleared by the [place] FDA and has been authorized for detection and/or diagnosis of SARS-CoV-2 by FDA under an Emergency Use Authorization (EUA). This EUA will remain in effect (meaning this test can be used) for the duration of the COVID-19 declaration under Section 564(b)(1) of the Act, 21 U.S.C. section 360bbb-3(b)(1),  unless the authorization is terminated or revoked.  Performed at [facility], [address], [place],  [address]     Time coordinating discharge: 35 mins   SIGNED:  Irwin Brakeman, MD  [facility] 06/20/2020, 1:50 PM How to contact the [facility] Attending or Consulting provider [place] or covering provider during after hours [place], for this patient?  1. Check the care team in [facility] and look for a) attending/consulting [facility] provider listed and b) the [facility] team listed 2. Log into [email or web address] and use [place]'s universal password to access. If you do not have the password, please contact the hospital operator. 3. Locate the [facility] provider you are looking for under [facility] and page to a number that you can be directly reached. 4. If you still have difficulty reaching the provider, please page the [facility] (Director on Call) for the Hospitalists listed on amion for assistance.

## 2020-06-20 NOTE — Progress Notes (Signed)
Nsg Discharge Note  Admit Date:  06/19/2020 Discharge date: 06/20/2020   Brandon Castillo to be D/C'd Hospice per MD order.  AVS completed.  Copy for chart, and copy for patient signed, and dated. Called report to [place] at [facility]. EMS came to transport patient to Hospice. Patient/caregiver able to verbalize understanding.  Discharge Medication: Allergies as of 06/20/2020      Reactions   Demerol [meperidine Hcl] Other (See Comments)   Had trouble waking up      Medication List    STOP taking these medications   apixaban 2.5 MG Tabs tablet Commonly known as: ELIQUIS   feeding supplement Liqd   ferrous sulfate 325 (65 FE) MG tablet   HYDROcodone-acetaminophen 10-325 MG tablet Commonly known as: NORCO   linaclotide 72 MCG capsule Commonly known as: LINZESS   LORazepam 1 MG tablet Commonly known as: ATIVAN   multivitamin with minerals tablet   multivitamin-lutein Caps capsule   ondansetron 4 MG tablet Commonly known as: ZOFRAN   pantoprazole 40 MG tablet Commonly known as: PROTONIX   polyethylene glycol 17 g packet Commonly known as: MIRALAX / GLYCOLAX   senna-docusate 8.6-50 MG tablet Commonly known as: Senokot-S   traZODone 100 MG tablet Commonly known as: DESYREL       Discharge Assessment: Vitals:   Skin clean, dry and intact without evidence of skin break down, no evidence of skin tears noted. IV catheter discontinued intact. Site without signs and symptoms of complications - no redness or edema noted at insertion site, patient denies c/o pain - only slight tenderness at site.  Dressing with slight pressure applied.  D/c Instructions-Education: Discharge instructions given to patient/family with verbalized understanding. D/c education completed with patient/family including follow up instructions, medication list, d/c activities limitations if indicated, with other d/c instructions as indicated by MD - patient able to verbalize understanding, all  questions fully answered. Patient instructed to return to ED, call 911, or call MD for any changes in condition.  Patient escorted via [place], and D/C home via private auto.  Santa Lighter, RN 06/20/2020 4:52 PM

## 2020-06-20 NOTE — Progress Notes (Signed)
Called report to [place] at [facility].

## 2020-06-20 NOTE — Plan of Care (Signed)
  Problem: Education: Goal: Knowledge of General Education information will improve Description: Including pain rating scale, medication(s)/side effects and non-pharmacologic comfort measures Outcome: Not Progressing   Problem: Health Behavior/Discharge Planning: Goal: Ability to manage health-related needs will improve Outcome: Not Progressing   

## 2020-06-20 NOTE — TOC Transition Note (Signed)
Transition of Care [facility]) - CM/SW Discharge Note   Patient Details  Name: Brandon Castillo MRN: [id number] Date of Birth: March 02, 1951  Transition of Care [facility]) CM/SW Contact:  Shade Flood, LCSW Phone Number: 06/20/2020, 2:04 PM   Clinical Narrative:     [place] has a residential bed for pt today. Pt and his brother remain in agreement with transfer to their facility. They can accept pt after 1600 today. RN to call report. Pt will need negative repeat covid test prior to transfer and this has been ordered. EMS arranged.   No other TOC needs for dc.    Barriers to Discharge: Barriers Resolved   Patient Goals and CMS Choice        Discharge Placement                Patient to be transferred to facility by: EMS Name of family member notified: [name] Patient and family notified of of transfer: 06/20/20  Discharge Plan and Services                                     Social Determinants of Health (SDOH) Interventions     Readmission Risk Interventions Readmission Risk Prevention Plan 06/20/2020 06/10/2020  Transportation Screening - Complete  HRI or [place] - Complete  Social Work Consult for [place] Planning/Counseling - Complete  Palliative Care Screening - Complete  Medication Review Press photographer) - Complete  SW Recovery Care/Counseling Consult Complete -  Palliative Care Screening Complete -  [place] Not Applicable -  Some recent data might be hidden

## 2020-06-20 NOTE — Progress Notes (Signed)
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  Patient stated that he was sleepwalking last night for the first time in his life and today,his brother was worried that he was confused, he complained of abdominal pain and patient states that he only had a small bowel movement yesterday, and was able to pass gas, but he also complained of abdominal pain. He denies nausea, vomiting, fever, chills.  After palliative discussions patient and family agreeable to residential hospice and has been placed in GIP status while waiting for bed.    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  I have increased frequency of PRN morphine and will check back later to see if it needs to be further increased. Goal is pain management. He has strong family support.    Subjective: Pt says the pain medicine wears off too soon and then for 1 hour he is in excruciating pain, asking   Objective: Vitals:    Intake/Output Summary (Last 24 hours) at 06/20/2020 1121 Last data filed at 06/20/2020 0900 Gross per 24 hour  Intake 320 ml  Output 1750 ml   Net -1430 ml   There were no vitals filed for this visit.  Examination:  General exam:frail, blind male with dark glasses, awake,NAD, cooperative. Respiratory system:coarse anterior breath sounds. Cardiovascular system:normalS1 &S2 heard.  Gastrointestinal system:Abdomen is nondistended, soft and diffusely tender greater on right upper Central nervous system:no focal findings. Extremities: Noc/c/e. Skin: No rashes, lesions or ulcers Psychiatry:normal affect. No agitation.  Foley with dark, bloody urine outputunchanged   Data Reviewed: I have personally reviewed following labs and imaging studies  CBC: Recent Labs  Lab 06/17/20 1050 06/18/20 0451  WBC 12.6* 13.5*  HGB 8.8* 8.8*  HCT 28.9* 29.2*  MCV 100.3* 99.7  PLT 479* 457*    Basic Metabolic Panel: Recent Labs  Lab 06/15/20 1116 06/17/20 1050 06/18/20 0451  NA 133* 134* 134*  K 4.0 3.2* 4.1  CL 98 102 101  CO2 27 24 25   GLUCOSE 150* 177* 109*  BUN 22 15 17   CREATININE 0.90 0.78 0.83  CALCIUM 9.2 8.7* 8.8*  MG  --   --  1.9    GFR: CrCl cannot be calculated (Unknown ideal weight.).  Liver Function Tests: No results for input(s): AST, ALT, ALKPHOS, BILITOT, PROT, ALBUMIN in the last 168 hours.  CBG: No results for input(s): GLUCAP in the last 168 hours.  Recent Results (from the past 240 hour(s))  Culture, Urine     Status: None   Collection Time: 06/16/20  9:14 AM   Specimen: Urine, Clean Catch  Result Value Ref Range Status   Specimen Description   Final    URINE, CLEAN CATCH Performed at [facility], [address] [place], [place] [address]    Special Requests  Final    NONE Performed at [facility], [address] [place], [place] [address]    Culture   Final    NO GROWTH Performed at [facility], [place] [address], [place], [place] [address]    Report Status 06/17/2020 FINAL  Final  Resp Panel by RT-PCR (Flu A&B, Covid) Nasopharyngeal Swab     Status: None    Collection Time: 06/16/20 11:05 AM   Specimen: Nasopharyngeal Swab; Nasopharyngeal(NP) swabs in vial transport medium  Result Value Ref Range Status   SARS Coronavirus 2 by RT PCR NEGATIVE NEGATIVE Final    Comment: (NOTE) SARS-CoV-2 target nucleic acids are NOT DETECTED.  The SARS-CoV-2 RNA is generally detectable in upper respiratory specimens during the acute phase of infection. The lowest concentration of SARS-CoV-2 viral copies this assay can detect is 138 copies/mL. A negative result does not preclude SARS-Cov-2 infection and should not be used as the sole basis for treatment or other patient management decisions. A negative result may occur with  improper specimen collection/handling, submission of specimen other than nasopharyngeal swab, presence of viral mutation(s) within the areas targeted by this assay, and inadequate number of viral copies(<138 copies/mL). A negative result must be combined with clinical observations, patient history, and epidemiological information. The expected result is Negative.  Fact Sheet for Patients:  EntrepreneurPulse.com.au  Fact Sheet for Healthcare Providers:  IncredibleEmployment.be  This test is no t yet approved or cleared by the [place] FDA and  has been authorized for detection and/or diagnosis of SARS-CoV-2 by FDA under an Emergency Use Authorization (EUA). This EUA will remain  in effect (meaning this test can be used) for the duration of the COVID-19 declaration under Section 564(b)(1) of the Act, 21 U.S.C.section 360bbb-3(b)(1), unless the authorization is terminated  or revoked sooner.       Influenza A by PCR NEGATIVE NEGATIVE Final   Influenza B by PCR NEGATIVE NEGATIVE Final    Comment: (NOTE) The Xpert Xpress SARS-CoV-2/FLU/RSV plus assay is intended as an aid in the diagnosis of influenza from Nasopharyngeal swab specimens and should not be used as a sole basis for treatment.  Nasal washings and aspirates are unacceptable for Xpert Xpress SARS-CoV-2/FLU/RSV testing.  Fact Sheet for Patients: EntrepreneurPulse.com.au  Fact Sheet for Healthcare Providers: IncredibleEmployment.be  This test is not yet approved or cleared by the [place] FDA and has been authorized for detection and/or diagnosis of SARS-CoV-2 by FDA under an Emergency Use Authorization (EUA). This EUA will remain in effect (meaning this test can be used) for the duration of the COVID-19 declaration under Section 564(b)(1) of the Act, 21 U.S.C. section 360bbb-3(b)(1), unless the authorization is terminated or revoked.  Performed at [facility], [address], [place], [place] [address]      Radiology Studies: No results found.   Scheduled Meds: . feeding supplement  237 mL Oral BID BM  . [START ON 06/21/2020] fentaNYL  1 patch Transdermal Q72H  . liver oil-zinc oxide   Topical Daily  . oxybutynin  2.5 mg Oral TID   Continuous Infusions: . sodium chloride       LOS: 1 day   Time spent: 30 mins  Yomayra Tate Wynetta Emery, MD How to contact the [facility] Attending or Consulting provider [place] or covering provider during after hours [place], for this patient?  1. Check the care team in [facility] and look for a) attending/consulting TRH provider listed and b) the [facility] team listed 2. Log into [email or web address] and  use [place]'s universal password to access. If you do not have the password, please contact the hospital operator. 3. Locate the [facility] provider you are looking for under [facility] and page to a number that you can be directly reached. 4. If you still have difficulty reaching the provider, please page the [facility] (Director on Call) for the Hospitalists listed on amion for assistance.  06/20/2020, 11:21 AM

## 2020-06-25 ENCOUNTER — Other Ambulatory Visit (HOSPITAL_COMMUNITY): Payer: Medicare HMO

## 2020-06-25 ENCOUNTER — Ambulatory Visit (HOSPITAL_COMMUNITY): Payer: Medicare HMO | Admitting: Oncology

## 2020-06-25 ENCOUNTER — Ambulatory Visit (HOSPITAL_COMMUNITY): Payer: Medicare HMO

## 2020-06-26 ENCOUNTER — Encounter: Payer: Medicare HMO | Admitting: Nurse Practitioner

## 2020-07-12 DEATH — deceased

## 2020-07-22 ENCOUNTER — Ambulatory Visit: Payer: Medicare HMO | Admitting: Urology

## 2021-04-01 IMAGING — CT CT ABD-PELV W/ CM
2 of 6 series · 11 of 46 positions shown, 12 images · IV contrast (Omnipaque or Isovue)
Comparison: CT of the abdomen and pelvis with contrast 05/11/2020.

CLINICAL DATA: The abdominal distension. Stage IV lung cancer.
Known liver metastases.

EXAM:
CT ABDOMEN AND PELVIS WITH CONTRAST
TECHNIQUE: Multidetector CT imaging of the abdomen and pelvis was performed
using the standard protocol following bolus administration of
intravenous contrast.
CONTRAST:  80mL OMNIPAQUE IOHEXOL 300 MG/ML  SOLN

[Series 2: axial st · axial · 0.63mm/px · z∈[+863,+1203]mm · 8 of 88 slices shown, 9 images]
[im 10/88  soft-tissue]
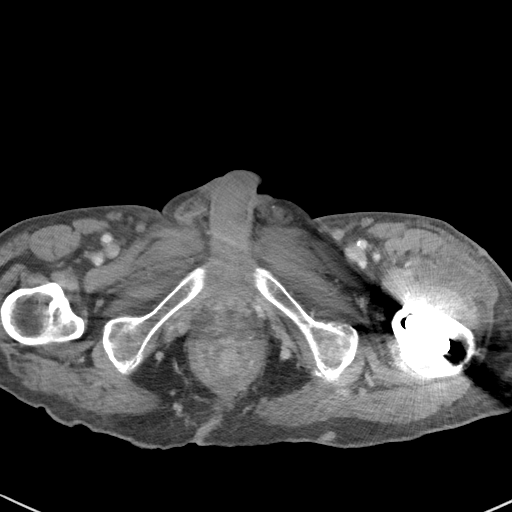
[im 10/88  bone]
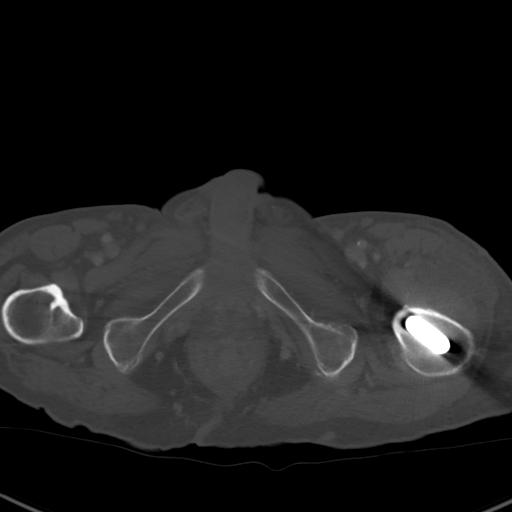
[im 20/88  soft-tissue]
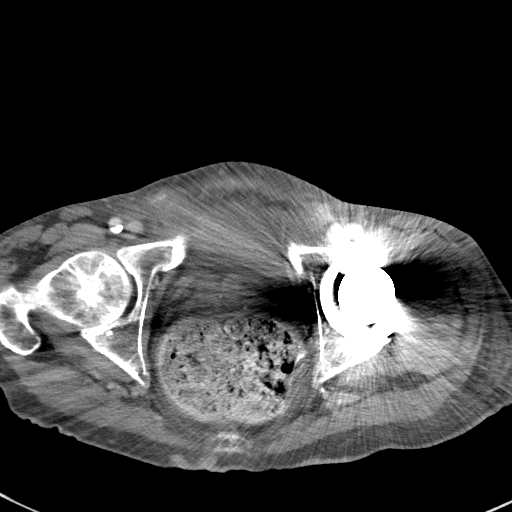
[im 30/88  soft-tissue]
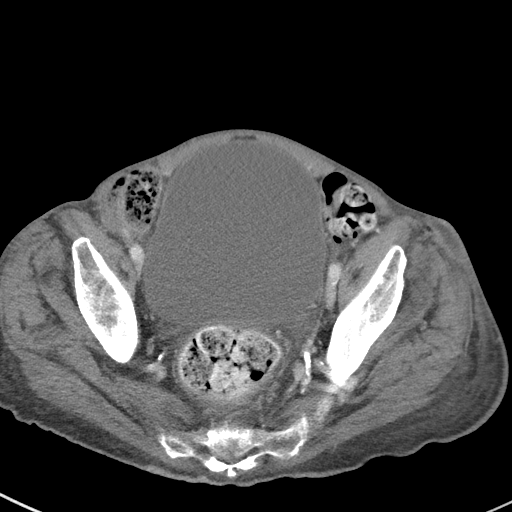
[im 39/88  soft-tissue]
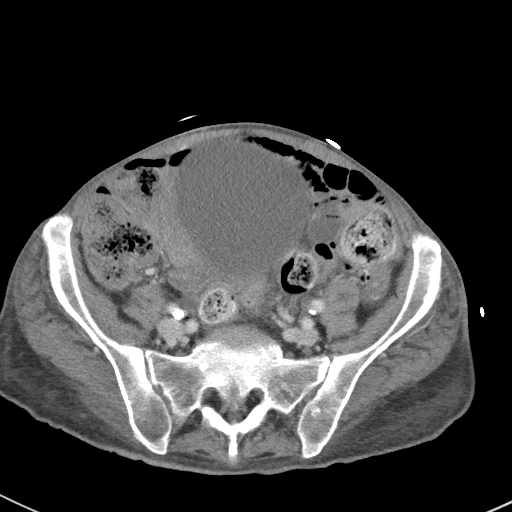
[im 49/88  soft-tissue]
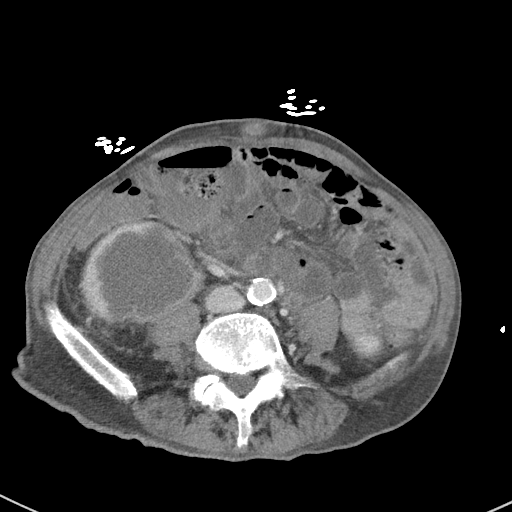
[im 59/88  soft-tissue]
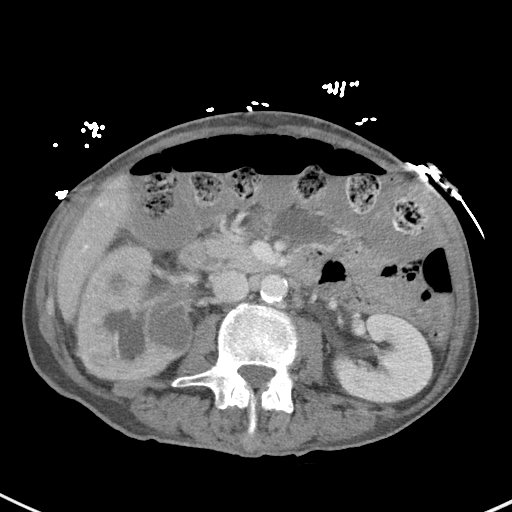
[im 68/88  soft-tissue]
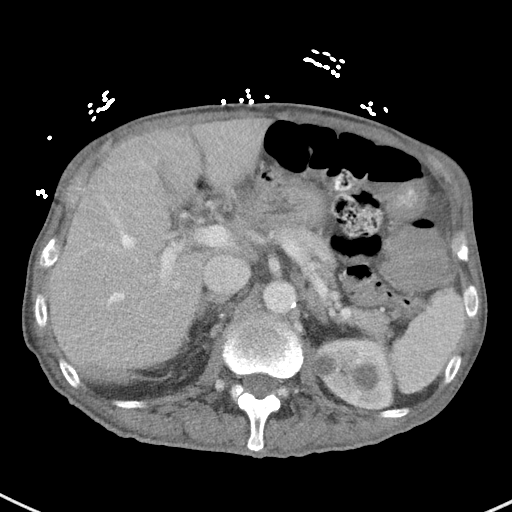
[im 78/88  soft-tissue]
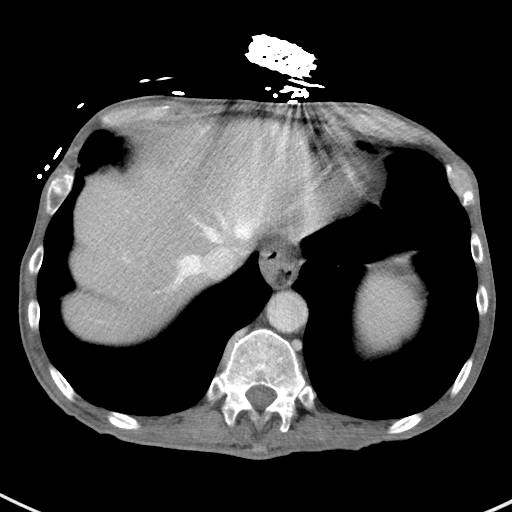

[Series 5: coronal st · coronal · 0.63mm/px · 3 of 82 slices shown]
[im 28/82  soft-tissue]
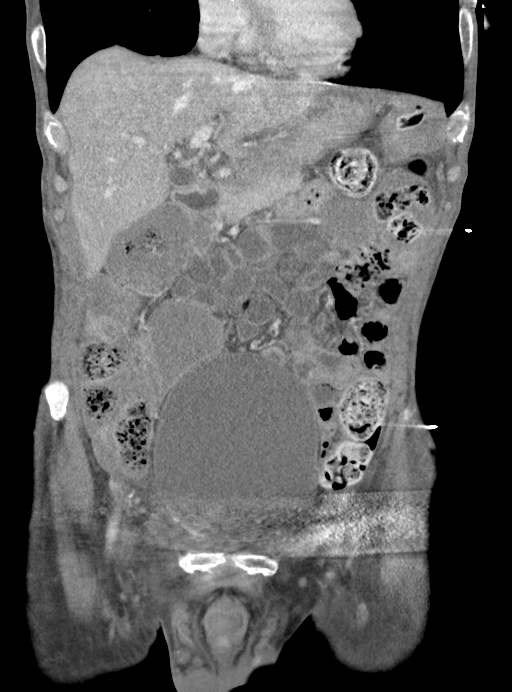
[im 37/82  soft-tissue]
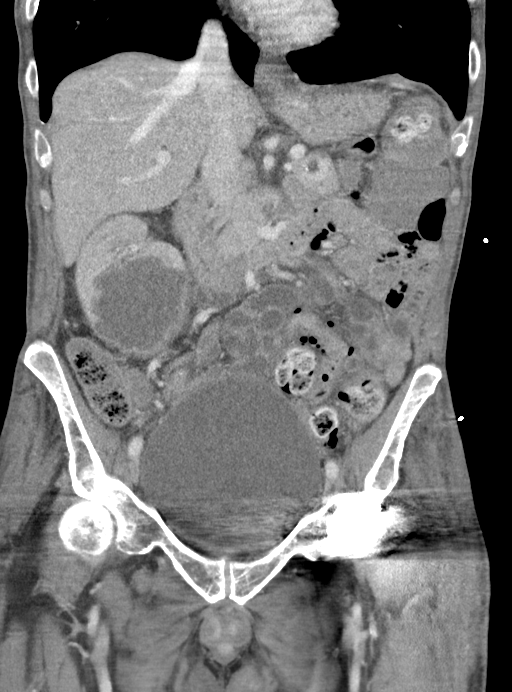
[im 46/82  soft-tissue]
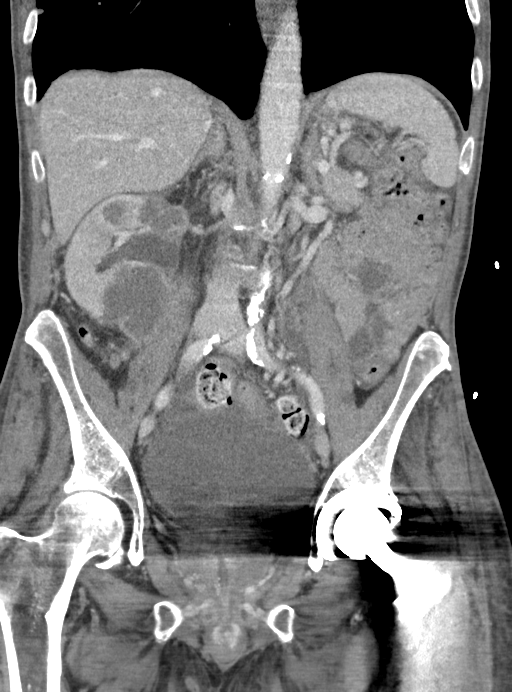

[11 of 46 positions shown; findings below may reference images not displayed]

FINDINGS: Lower chest: Scarring at the right lung base is stable. Heart size
is normal. No significant pleural or pericardial effusion is
present.

Hepatobiliary: Focal hepatic lesion near the gallbladder fossa has
increased in size, now measuring 2 4 cm. No new hepatic lesions are
present. The common bile duct and gallbladder.

Pancreas: Unremarkable. No pancreatic ductal dilatation or
surrounding inflammatory changes.

Spleen: Normal in size without focal abnormality.

Adrenals/Urinary Tract: Adrenal glands are within normal limits
bilaterally. Multiple hypodense renal lesions have increased in
size. The largest is at the lower pole of the right kidney, now
measuring 6.0 x 6.5 x 6.0 cm. This displaces the collecting system
upwards with mild caliectasis. Additional lesions near the upper
pole of the right kidney have a similar density. The largest
scratched at the second largest lesion measures up to 4.1 cm. Two
similar lesions are present the upper pole of the left kidney both
increased in size. The larger measures 1.9 cm on the coronal images.
Ureters are within normal limits. The urinary bladder is distended
present. Poor collaterals obscured artifact placement

Stomach/Bowel: Acute. Is. Soft tissue density near the tip of the
cecum may represent peritoneal implant measuring 2.4 x 1.6 x 1.5 cm.
Proximal ascending colon is within normal limits. There is some wall
thickening in the distal ascending colon marked wall thickening
throughout the ears: The wall thickening is predominantly on the
mesenteric surface. Anti mesenteric surface the colon is
unremarkable. The more distal descending colon is normal. A large
amount of stool is present in the distal sigmoid colon and rectum a
transverse diameter of the rectum up to 9.2 cm.

Vascular/Lymphatic: Aortocaval node at the level of the kidneys is
stable 6 mm in short axis on image 29. No significant
retroperitoneal adenopathy is present. Atherosclerotic
calcifications are present aorta and branch vessels without
aneurysm. Celiac and SMA origins are within limits.

Reproductive: Prostate is enlarged, stable.

Other: No free air is present. No significant ventral hernia is
present.

Musculoskeletal: Interval superior endplate fractures present at L1
25% loss of height is noted anteriorly without retropulsed bone.
Chronic endplate changes are present left at L4-5. No focal lytic or
blastic lesions are present. Bony pelvis is intact. Left total hip
arthroplasty noted. The right hip is normal.
IMPRESSION: 1. Marked wall thickening throughout the transverse colon. This is
an atypical appearance of edema. While this could represent ischemia
increased lactic acid level, tumor infiltration is also considered.
2. Large amount of stool in the distal sigmoid colon and rectum
compatible with constipation. The transverse diameter of the rectum
up to 9.2 cm.
3. Enlarging hypodense lesions in the kidneys bilaterally consistent
with progressive metastatic disease.
4. Enlarging hypodense lesion near the gallbladder fossa is also
consistent with progressive metastasis.
5. Soft tissue density near the tip of the cecum may represent
peritoneal implant measuring 2.4 x 1.6 x 1.5 cm. No other definite
peritoneal implants are evident.
6. Marked distention of the urinary bladder. This accounts for much
of the lower abdominal distension.
7. Interval superior endplate compression fracture at L1 with 25%
loss of height anteriorly without retropulsed bone.
8. Aortic Atherosclerosis ([id number]).

## 2021-04-12 IMAGING — US US ABDOMEN COMPLETE
1 series · 13 of 25 positions shown · non-contrast
Comparison: CT abdomen and pelvis 06/05/2020

CLINICAL DATA: Abdominal pain, new diagnosis of stage IV lung
cancer, abnormal LFTs

EXAM:
ABDOMEN ULTRASOUND COMPLETE

[Series 1: us abdomen complete · 13 of 132 slices shown]
[im 1/132]
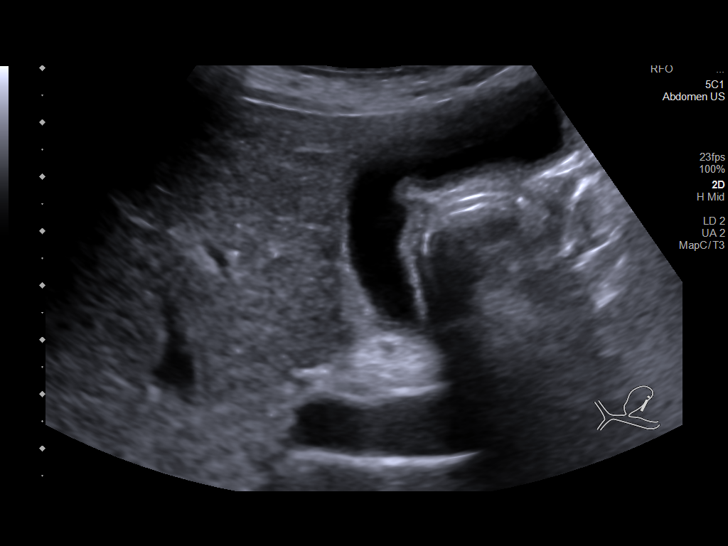
[im 11/132]
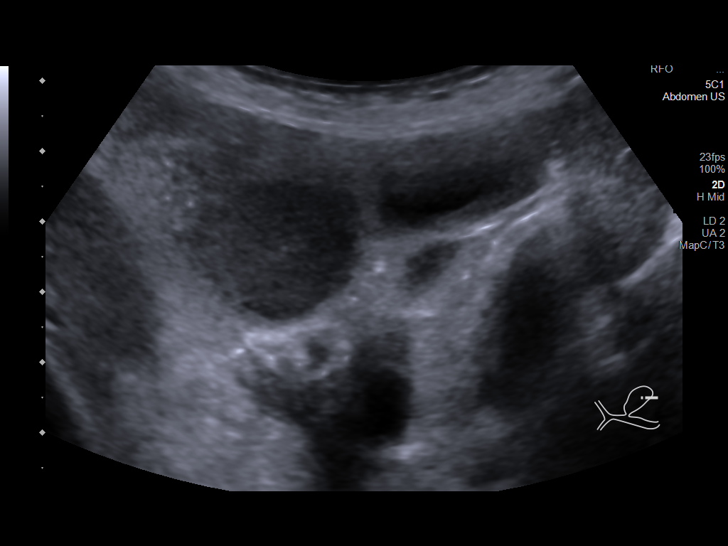
[im 22/132]
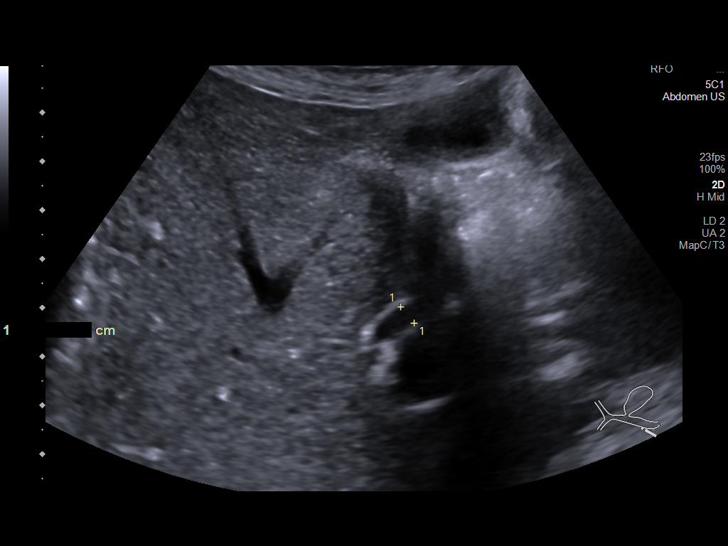
[im 33/132]
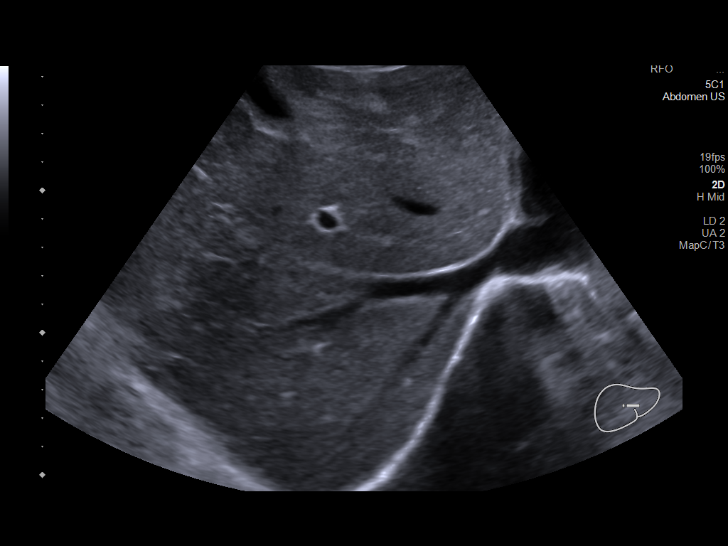
[im 44/132]
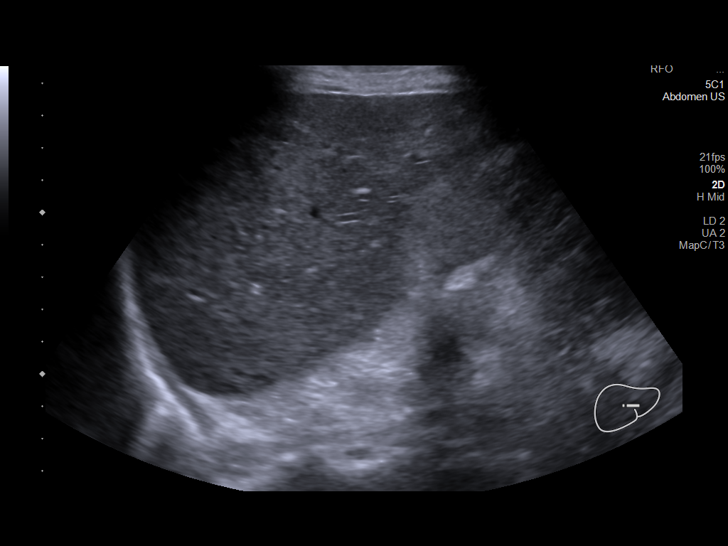
[im 55/132]
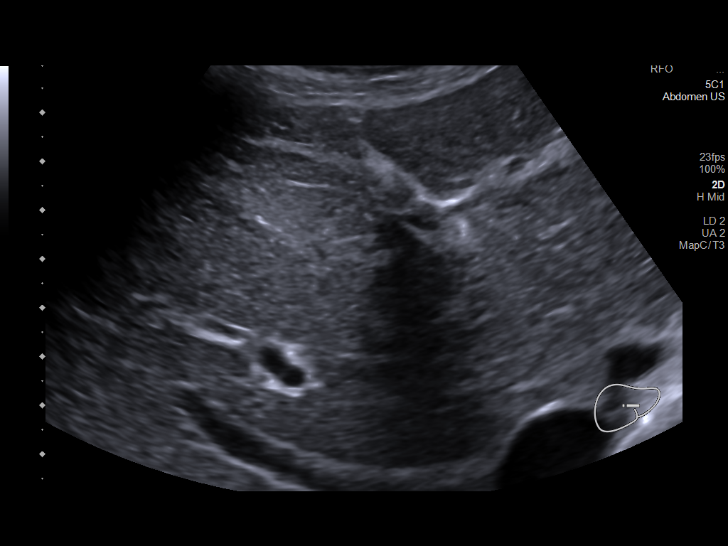
[im 66/132]
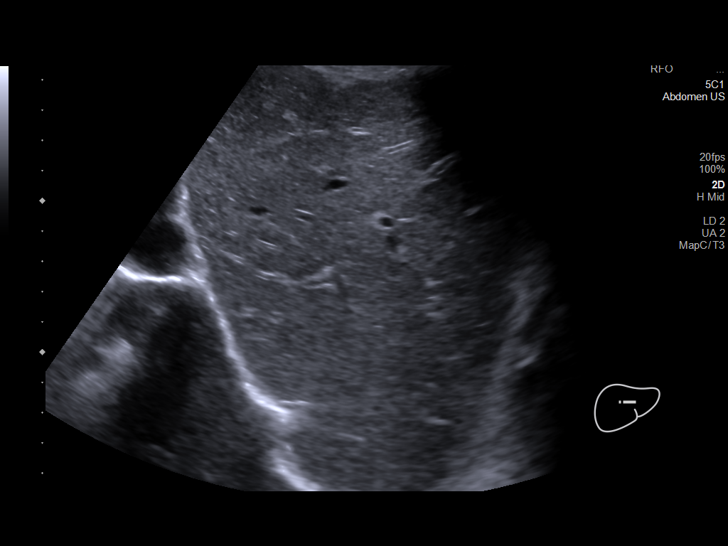
[im 77/132]
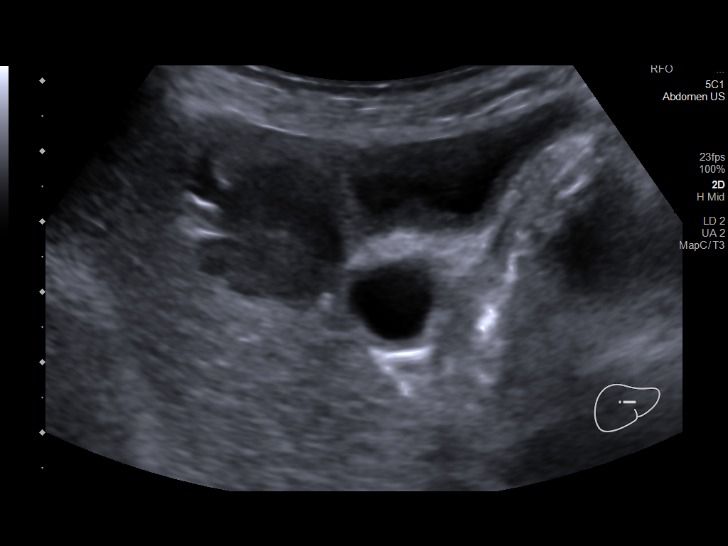
[im 88/132]
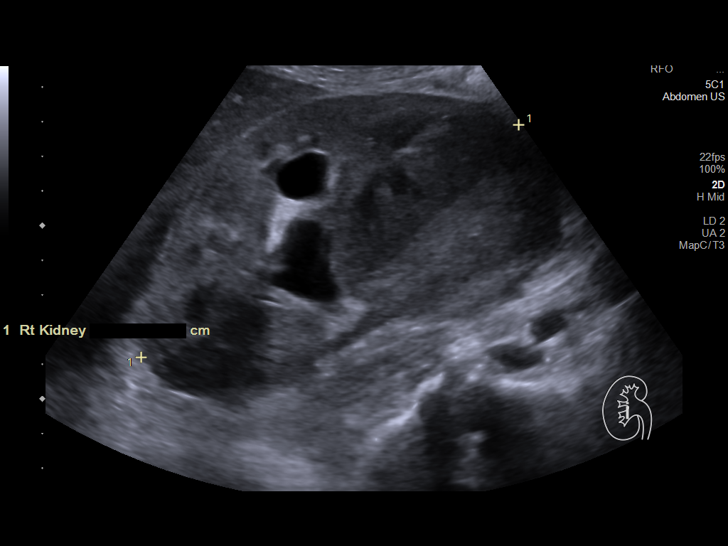
[im 99/132]
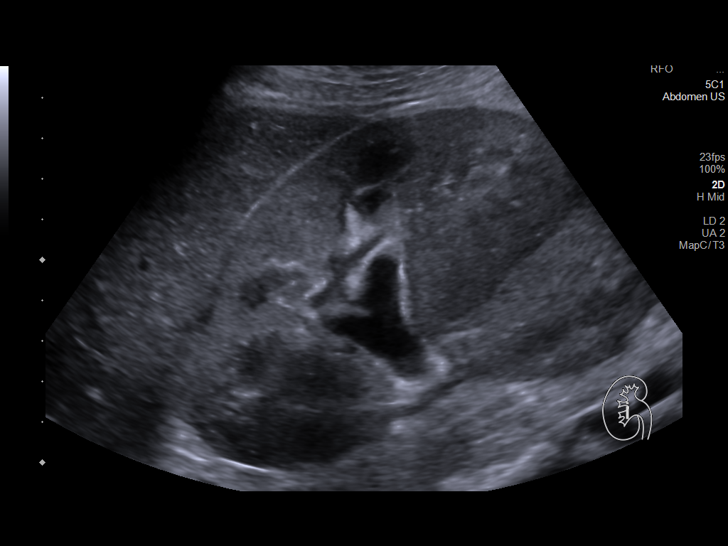
[im 110/132]
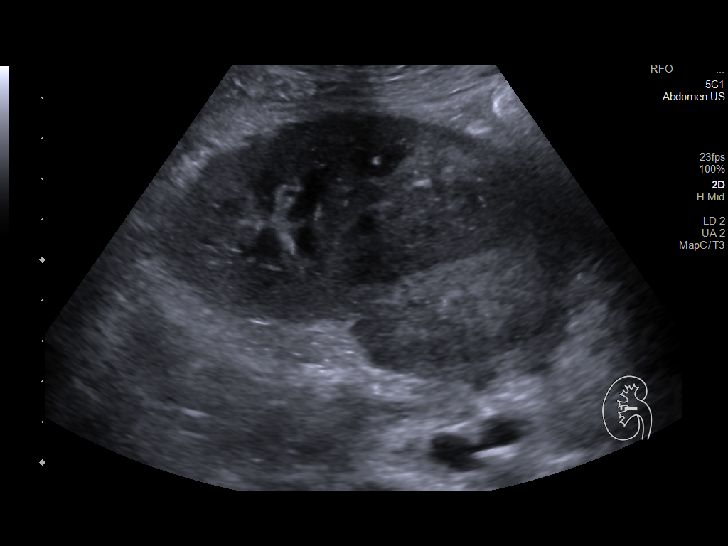
[im 121/132]
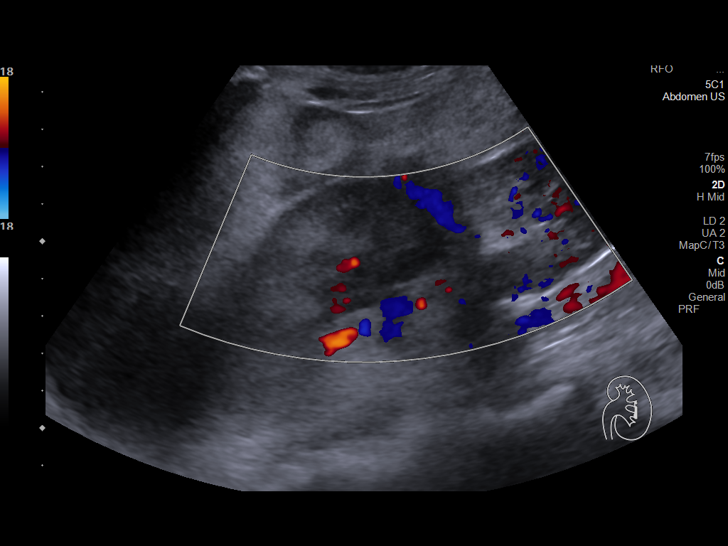
[im 132/132]
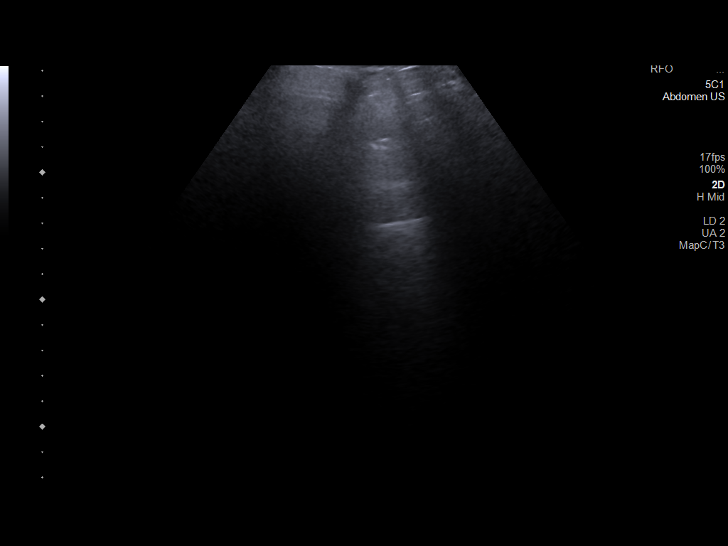

[13 of 25 positions shown; findings below may reference images not displayed]

FINDINGS: Gallbladder: Normally distended without stones or wall thickening.
No pericholecystic fluid or sonographic Murphy sign.

Common bile duct: Diameter: 4 mm, normal

Liver: Mildly heterogeneous echogenicity. Mass identified within
RIGHT lobe liver 2.4 x 2.0 x 2.9 cm. No additional hepatic masses.
Portal vein is patent on color Doppler imaging with normal direction
of blood flow towards the liver.

IVC: Normal appearance

Pancreas: Obscured by bowel gas

Spleen: Normal appearance, 4.5 cm length

Right Kidney: Length: 12.8 cm. Normal cortical thickness. Increased
cortical echogenicity. Moderate RIGHT hydronephrosis. Large
heterogeneous solid mass at inferior pole 7.0 x 6.2 x 6.1 cm
corresponding to tumor on CT. Additional smaller mass at upper pole
less well demonstrated due to shadowing, approximately 3.1 x 3.7 cm
on sagittal image.

Left Kidney: Length: 10.8 cm. Cortical thickening mid kidney. Area
of somewhat masslike prominence at mid LEFT kidney 5.1 cm greatest
diameter but may be an artifact, without definite mass at this
position seen on prior CT. Small nodules at upper pole of LEFT
kidney on prior CT exam is not delineated due to shadowing by bowel
gas.

Abdominal aorta: Normal caliber proximal to mid, obscured distally
by bowel gas

Other findings: No free fluid
IMPRESSION: Moderate RIGHT hydronephrosis.

Two RIGHT renal masses consistent with tumor, larger 7.0 cm diameter
at inferior pole.

Mass in RIGHT lobe liver 2.9 cm greatest size, corresponding to
tumor seen on CT.

Masslike area within the LEFT kidney does not demonstrate a
corresponding lesion on CT, suspect represents artifact rather than
tumor though could be characterized by MR if clinically indicated.

Pancreas obscured by bowel gas.
